# Patient Record
Sex: Male | Born: 1940 | Race: Black or African American | Hispanic: No | Marital: Single | Smoking: Former smoker
Health system: Southern US, Community
[De-identification: ages and names within clinical notes are randomized; demographics above are authoritative.]

## PROBLEM LIST (undated history)

## (undated) DIAGNOSIS — I712 Thoracic aortic aneurysm, without rupture, unspecified: Secondary | ICD-10-CM

## (undated) DIAGNOSIS — N400 Enlarged prostate without lower urinary tract symptoms: Secondary | ICD-10-CM

## (undated) DIAGNOSIS — D72819 Decreased white blood cell count, unspecified: Secondary | ICD-10-CM

## (undated) DIAGNOSIS — J449 Chronic obstructive pulmonary disease, unspecified: Secondary | ICD-10-CM

## (undated) DIAGNOSIS — K219 Gastro-esophageal reflux disease without esophagitis: Secondary | ICD-10-CM

## (undated) DIAGNOSIS — I509 Heart failure, unspecified: Secondary | ICD-10-CM

## (undated) DIAGNOSIS — I10 Essential (primary) hypertension: Secondary | ICD-10-CM

## (undated) DIAGNOSIS — C9000 Multiple myeloma not having achieved remission: Principal | ICD-10-CM

## (undated) DIAGNOSIS — R0602 Shortness of breath: Secondary | ICD-10-CM

## (undated) HISTORY — DX: Decreased white blood cell count, unspecified: D72.819

## (undated) HISTORY — DX: Multiple myeloma not having achieved remission: C90.00

## (undated) HISTORY — DX: Benign prostatic hyperplasia without lower urinary tract symptoms: N40.0

## (undated) HISTORY — DX: Gastro-esophageal reflux disease without esophagitis: K21.9

---

## 1995-04-21 HISTORY — PX: PENILE PROSTHESIS IMPLANT: SHX240

## 1998-02-15 ENCOUNTER — Emergency Department (HOSPITAL_COMMUNITY): Admission: EM | Admit: 1998-02-15 | Discharge: 1998-02-15 | Payer: Self-pay | Admitting: Emergency Medicine

## 1998-02-22 ENCOUNTER — Encounter: Payer: Self-pay | Admitting: Emergency Medicine

## 1998-02-22 ENCOUNTER — Emergency Department (HOSPITAL_COMMUNITY): Admission: EM | Admit: 1998-02-22 | Discharge: 1998-02-22 | Payer: Self-pay | Admitting: Emergency Medicine

## 1998-06-22 ENCOUNTER — Emergency Department (HOSPITAL_COMMUNITY): Admission: EM | Admit: 1998-06-22 | Discharge: 1998-06-22 | Payer: Self-pay | Admitting: *Deleted

## 1999-04-25 ENCOUNTER — Ambulatory Visit (HOSPITAL_COMMUNITY): Admission: RE | Admit: 1999-04-25 | Discharge: 1999-04-25 | Payer: Self-pay | Admitting: Internal Medicine

## 1999-12-05 ENCOUNTER — Encounter: Admission: RE | Admit: 1999-12-05 | Discharge: 1999-12-05 | Payer: Self-pay | Admitting: Otolaryngology

## 1999-12-05 ENCOUNTER — Encounter: Payer: Self-pay | Admitting: Otolaryngology

## 2000-01-18 ENCOUNTER — Encounter: Payer: Self-pay | Admitting: Emergency Medicine

## 2000-01-18 ENCOUNTER — Emergency Department (HOSPITAL_COMMUNITY): Admission: EM | Admit: 2000-01-18 | Discharge: 2000-01-18 | Payer: Self-pay | Admitting: Emergency Medicine

## 2000-01-19 ENCOUNTER — Emergency Department (HOSPITAL_COMMUNITY): Admission: EM | Admit: 2000-01-19 | Discharge: 2000-01-20 | Payer: Self-pay | Admitting: Emergency Medicine

## 2000-01-23 ENCOUNTER — Inpatient Hospital Stay (HOSPITAL_COMMUNITY): Admission: EM | Admit: 2000-01-23 | Discharge: 2000-01-26 | Payer: Self-pay | Admitting: Emergency Medicine

## 2000-01-23 ENCOUNTER — Encounter: Payer: Self-pay | Admitting: Emergency Medicine

## 2000-02-19 ENCOUNTER — Encounter: Payer: Self-pay | Admitting: Emergency Medicine

## 2000-02-19 ENCOUNTER — Emergency Department (HOSPITAL_COMMUNITY): Admission: EM | Admit: 2000-02-19 | Discharge: 2000-02-19 | Payer: Self-pay | Admitting: Emergency Medicine

## 2000-02-21 ENCOUNTER — Inpatient Hospital Stay (HOSPITAL_COMMUNITY): Admission: EM | Admit: 2000-02-21 | Discharge: 2000-02-26 | Payer: Self-pay | Admitting: Emergency Medicine

## 2000-02-21 ENCOUNTER — Encounter: Payer: Self-pay | Admitting: Emergency Medicine

## 2000-08-23 ENCOUNTER — Observation Stay (HOSPITAL_COMMUNITY): Admission: EM | Admit: 2000-08-23 | Discharge: 2000-08-24 | Payer: Self-pay | Admitting: Emergency Medicine

## 2000-08-23 ENCOUNTER — Encounter: Payer: Self-pay | Admitting: Emergency Medicine

## 2000-08-24 ENCOUNTER — Encounter: Payer: Self-pay | Admitting: Interventional Cardiology

## 2001-08-06 ENCOUNTER — Inpatient Hospital Stay (HOSPITAL_COMMUNITY): Admission: EM | Admit: 2001-08-06 | Discharge: 2001-08-08 | Payer: Self-pay | Admitting: Emergency Medicine

## 2001-08-06 ENCOUNTER — Encounter: Payer: Self-pay | Admitting: Emergency Medicine

## 2002-01-27 ENCOUNTER — Emergency Department (HOSPITAL_COMMUNITY): Admission: EM | Admit: 2002-01-27 | Discharge: 2002-01-27 | Payer: Self-pay | Admitting: Emergency Medicine

## 2002-01-27 ENCOUNTER — Encounter: Payer: Self-pay | Admitting: Emergency Medicine

## 2002-01-31 ENCOUNTER — Encounter: Payer: Self-pay | Admitting: Emergency Medicine

## 2002-02-01 ENCOUNTER — Inpatient Hospital Stay (HOSPITAL_COMMUNITY): Admission: EM | Admit: 2002-02-01 | Discharge: 2002-02-05 | Payer: Self-pay | Admitting: Emergency Medicine

## 2002-02-02 ENCOUNTER — Encounter: Payer: Self-pay | Admitting: Internal Medicine

## 2002-07-24 ENCOUNTER — Encounter: Payer: Self-pay | Admitting: Surgery

## 2002-07-24 ENCOUNTER — Encounter: Admission: RE | Admit: 2002-07-24 | Discharge: 2002-07-24 | Payer: Self-pay | Admitting: Surgery

## 2002-07-25 ENCOUNTER — Ambulatory Visit (HOSPITAL_BASED_OUTPATIENT_CLINIC_OR_DEPARTMENT_OTHER): Admission: RE | Admit: 2002-07-25 | Discharge: 2002-07-25 | Payer: Self-pay | Admitting: Surgery

## 2003-04-09 ENCOUNTER — Ambulatory Visit (HOSPITAL_COMMUNITY): Admission: RE | Admit: 2003-04-09 | Discharge: 2003-04-09 | Payer: Self-pay | Admitting: Internal Medicine

## 2004-01-24 ENCOUNTER — Ambulatory Visit: Payer: Self-pay | Admitting: *Deleted

## 2004-02-08 ENCOUNTER — Ambulatory Visit: Payer: Self-pay | Admitting: Internal Medicine

## 2004-04-24 ENCOUNTER — Emergency Department (HOSPITAL_COMMUNITY): Admission: EM | Admit: 2004-04-24 | Discharge: 2004-04-24 | Payer: Self-pay | Admitting: Emergency Medicine

## 2004-04-28 ENCOUNTER — Ambulatory Visit (HOSPITAL_COMMUNITY): Admission: RE | Admit: 2004-04-28 | Discharge: 2004-04-28 | Payer: Self-pay | Admitting: Urology

## 2004-08-11 ENCOUNTER — Ambulatory Visit: Payer: Self-pay | Admitting: Internal Medicine

## 2004-09-26 ENCOUNTER — Emergency Department (HOSPITAL_COMMUNITY): Admission: EM | Admit: 2004-09-26 | Discharge: 2004-09-26 | Payer: Self-pay | Admitting: Emergency Medicine

## 2004-10-08 ENCOUNTER — Ambulatory Visit: Payer: Self-pay | Admitting: Internal Medicine

## 2004-10-14 ENCOUNTER — Emergency Department (HOSPITAL_COMMUNITY): Admission: EM | Admit: 2004-10-14 | Discharge: 2004-10-14 | Payer: Self-pay | Admitting: Emergency Medicine

## 2004-12-08 ENCOUNTER — Ambulatory Visit (HOSPITAL_COMMUNITY): Admission: RE | Admit: 2004-12-08 | Discharge: 2004-12-08 | Payer: Self-pay | Admitting: Urology

## 2005-02-12 ENCOUNTER — Emergency Department (HOSPITAL_COMMUNITY): Admission: EM | Admit: 2005-02-12 | Discharge: 2005-02-12 | Payer: Self-pay | Admitting: Emergency Medicine

## 2005-07-21 ENCOUNTER — Ambulatory Visit: Payer: Self-pay | Admitting: Internal Medicine

## 2005-08-11 ENCOUNTER — Ambulatory Visit: Payer: Self-pay | Admitting: Internal Medicine

## 2005-09-29 ENCOUNTER — Ambulatory Visit: Payer: Self-pay | Admitting: Internal Medicine

## 2005-10-01 ENCOUNTER — Emergency Department (HOSPITAL_COMMUNITY): Admission: EM | Admit: 2005-10-01 | Discharge: 2005-10-01 | Payer: Self-pay | Admitting: Emergency Medicine

## 2005-10-17 ENCOUNTER — Emergency Department (HOSPITAL_COMMUNITY): Admission: EM | Admit: 2005-10-17 | Discharge: 2005-10-17 | Payer: Self-pay | Admitting: *Deleted

## 2005-11-04 ENCOUNTER — Emergency Department (HOSPITAL_COMMUNITY): Admission: EM | Admit: 2005-11-04 | Discharge: 2005-11-04 | Payer: Self-pay | Admitting: Emergency Medicine

## 2006-03-05 ENCOUNTER — Ambulatory Visit: Payer: Self-pay | Admitting: Internal Medicine

## 2006-03-27 ENCOUNTER — Emergency Department (HOSPITAL_COMMUNITY): Admission: EM | Admit: 2006-03-27 | Discharge: 2006-03-28 | Payer: Self-pay | Admitting: Emergency Medicine

## 2006-04-05 ENCOUNTER — Emergency Department (HOSPITAL_COMMUNITY): Admission: EM | Admit: 2006-04-05 | Discharge: 2006-04-05 | Payer: Self-pay | Admitting: Family Medicine

## 2006-04-16 ENCOUNTER — Ambulatory Visit: Payer: Self-pay | Admitting: Internal Medicine

## 2006-06-04 ENCOUNTER — Ambulatory Visit: Payer: Self-pay | Admitting: Internal Medicine

## 2006-08-20 ENCOUNTER — Ambulatory Visit: Payer: Self-pay | Admitting: Internal Medicine

## 2007-04-05 ENCOUNTER — Encounter: Payer: Self-pay | Admitting: Internal Medicine

## 2007-04-05 ENCOUNTER — Ambulatory Visit: Payer: Self-pay | Admitting: Internal Medicine

## 2007-04-05 LAB — CONVERTED CEMR LAB
ALT: 11 units/L (ref 0–53)
AST: 20 units/L (ref 0–37)
Albumin: 4.8 g/dL (ref 3.5–5.2)
Alkaline Phosphatase: 67 units/L (ref 39–117)
BUN: 21 mg/dL (ref 6–23)
Basophils Relative: 0 % (ref 0–1)
Calcium: 10.4 mg/dL (ref 8.4–10.5)
Chloride: 104 meq/L (ref 96–112)
HDL: 53 mg/dL (ref >39)
LDL Cholesterol: 54 mg/dL (ref 0–99)
MCHC: 33.6 g/dL (ref 30.0–36.0)
Monocytes Relative: 13 % — ABNORMAL HIGH (ref 3–12)
Neutro Abs: 1.7 10*3/uL (ref 1.7–7.7)
Neutrophils Relative %: 31 % — ABNORMAL LOW (ref 43–77)
PSA: 3.81 ng/mL (ref 0.10–4.00)
Platelets: 216 10*3/uL (ref 150–400)
Potassium: 4 meq/L (ref 3.5–5.3)
RBC: 4.54 M/uL (ref 4.22–5.81)
Sodium: 142 meq/L (ref 135–145)
Total Protein: 7.7 g/dL (ref 6.0–8.3)
WBC: 5.4 10*3/uL (ref 4.0–10.5)

## 2007-06-23 ENCOUNTER — Ambulatory Visit: Payer: Self-pay | Admitting: Internal Medicine

## 2007-07-04 ENCOUNTER — Ambulatory Visit: Payer: Self-pay | Admitting: Internal Medicine

## 2007-07-28 ENCOUNTER — Ambulatory Visit: Payer: Self-pay | Admitting: Internal Medicine

## 2007-08-10 ENCOUNTER — Ambulatory Visit: Payer: Self-pay | Admitting: Gastroenterology

## 2007-08-10 LAB — CONVERTED CEMR LAB
ALT: 17 units/L (ref 0–53)
Albumin: 4.3 g/dL (ref 3.5–5.2)
Alkaline Phosphatase: 66 units/L (ref 39–117)
BUN: 11 mg/dL (ref 6–23)
Basophils Relative: 0.5 % (ref 0.0–1.0)
Bilirubin, Direct: 0.1 mg/dL (ref 0.0–0.3)
CO2: 34 meq/L — ABNORMAL HIGH (ref 19–32)
Calcium: 9.8 mg/dL (ref 8.4–10.5)
Eosinophils Relative: 2.4 % (ref 0.0–5.0)
GFR calc Af Amer: 96 mL/min
Glucose, Bld: 97 mg/dL (ref 70–99)
HCT: 41.2 % (ref 39.0–52.0)
Hemoglobin: 13.6 g/dL (ref 13.0–17.0)
Lymphocytes Relative: 53.9 % — ABNORMAL HIGH (ref 12.0–46.0)
Monocytes Absolute: 0.6 10*3/uL (ref 0.1–1.0)
Monocytes Relative: 10.1 % (ref 3.0–12.0)
Neutro Abs: 1.9 10*3/uL (ref 1.4–7.7)
RBC: 4.27 M/uL (ref 4.22–5.81)
Sodium: 141 meq/L (ref 135–145)
Total Protein: 7.5 g/dL (ref 6.0–8.3)
WBC: 5.7 10*3/uL (ref 4.5–10.5)

## 2007-08-19 ENCOUNTER — Encounter (INDEPENDENT_AMBULATORY_CARE_PROVIDER_SITE_OTHER): Payer: Self-pay | Admitting: *Deleted

## 2007-08-23 ENCOUNTER — Ambulatory Visit: Payer: Self-pay | Admitting: Gastroenterology

## 2007-09-05 ENCOUNTER — Ambulatory Visit: Payer: Self-pay | Admitting: Internal Medicine

## 2007-09-06 ENCOUNTER — Ambulatory Visit (HOSPITAL_COMMUNITY): Admission: RE | Admit: 2007-09-06 | Discharge: 2007-09-06 | Payer: Self-pay | Admitting: Internal Medicine

## 2008-06-14 ENCOUNTER — Telehealth: Payer: Self-pay | Admitting: Internal Medicine

## 2008-07-11 ENCOUNTER — Ambulatory Visit: Payer: Self-pay | Admitting: Internal Medicine

## 2008-07-11 LAB — CONVERTED CEMR LAB
BUN: 12 mg/dL (ref 6–23)
Calcium: 10.1 mg/dL (ref 8.4–10.5)
Glucose, Bld: 90 mg/dL (ref 70–99)
Potassium: 4.5 meq/L (ref 3.5–5.3)
Sodium: 144 meq/L (ref 135–145)

## 2009-01-01 ENCOUNTER — Ambulatory Visit: Admission: RE | Admit: 2009-01-01 | Discharge: 2009-03-27 | Payer: Self-pay | Admitting: Radiation Oncology

## 2009-03-27 ENCOUNTER — Ambulatory Visit: Admission: RE | Admit: 2009-03-27 | Discharge: 2009-04-19 | Payer: Self-pay | Admitting: Radiation Oncology

## 2009-04-22 ENCOUNTER — Ambulatory Visit: Admission: RE | Admit: 2009-04-22 | Discharge: 2009-05-24 | Payer: Self-pay | Admitting: Radiation Oncology

## 2009-08-20 ENCOUNTER — Ambulatory Visit (HOSPITAL_COMMUNITY): Admission: RE | Admit: 2009-08-20 | Discharge: 2009-08-20 | Payer: Self-pay | Admitting: Internal Medicine

## 2009-08-20 ENCOUNTER — Ambulatory Visit: Payer: Self-pay | Admitting: Internal Medicine

## 2009-08-20 LAB — CONVERTED CEMR LAB
ALT: 21 units/L (ref 0–53)
AST: 41 units/L — ABNORMAL HIGH (ref 0–37)
Albumin: 5 g/dL (ref 3.5–5.2)
CO2: 29 meq/L (ref 19–32)
Calcium: 9.8 mg/dL (ref 8.4–10.5)
Chloride: 90 meq/L — ABNORMAL LOW (ref 96–112)
Lymphocytes Relative: 40 % (ref 12–46)
Lymphs Abs: 1.1 10*3/uL (ref 0.7–4.0)
Monocytes Relative: 14 % — ABNORMAL HIGH (ref 3–12)
Neutrophils Relative %: 44 % (ref 43–77)
Platelets: 255 10*3/uL (ref 150–400)
Potassium: 4.4 meq/L (ref 3.5–5.3)
RBC: 4.23 M/uL (ref 4.22–5.81)
Sodium: 131 meq/L — ABNORMAL LOW (ref 135–145)
Total Protein: 7.7 g/dL (ref 6.0–8.3)
WBC: 2.9 10*3/uL — ABNORMAL LOW (ref 4.0–10.5)

## 2009-09-04 ENCOUNTER — Ambulatory Visit: Payer: Self-pay | Admitting: Internal Medicine

## 2009-09-04 LAB — CONVERTED CEMR LAB
Basophils Relative: 0 % (ref 0–1)
Eosinophils Absolute: 0.1 10*3/uL (ref 0.0–0.7)
Eosinophils Relative: 4 % (ref 0–5)
HCT: 36 % — ABNORMAL LOW (ref 39.0–52.0)
Lymphs Abs: 1 10*3/uL (ref 0.7–4.0)
MCHC: 35 g/dL (ref 30.0–36.0)
MCV: 90.9 fL (ref 78.0–100.0)
Platelets: 231 10*3/uL (ref 150–400)
RDW: 12.3 % (ref 11.5–15.5)
WBC: 3.2 10*3/uL — ABNORMAL LOW (ref 4.0–10.5)

## 2009-09-05 ENCOUNTER — Ambulatory Visit (HOSPITAL_COMMUNITY): Admission: RE | Admit: 2009-09-05 | Discharge: 2009-09-05 | Payer: Self-pay | Admitting: Internal Medicine

## 2009-09-06 ENCOUNTER — Encounter (INDEPENDENT_AMBULATORY_CARE_PROVIDER_SITE_OTHER): Payer: Self-pay | Admitting: Internal Medicine

## 2009-09-20 ENCOUNTER — Ambulatory Visit: Payer: Self-pay | Admitting: Oncology

## 2009-10-01 ENCOUNTER — Encounter (INDEPENDENT_AMBULATORY_CARE_PROVIDER_SITE_OTHER): Payer: Self-pay | Admitting: *Deleted

## 2009-10-01 ENCOUNTER — Ambulatory Visit: Payer: Self-pay | Admitting: Gastroenterology

## 2009-10-01 DIAGNOSIS — R636 Underweight: Secondary | ICD-10-CM | POA: Insufficient documentation

## 2009-10-01 DIAGNOSIS — K3189 Other diseases of stomach and duodenum: Secondary | ICD-10-CM | POA: Insufficient documentation

## 2009-10-01 DIAGNOSIS — R1013 Epigastric pain: Secondary | ICD-10-CM

## 2009-10-09 ENCOUNTER — Ambulatory Visit: Payer: Self-pay | Admitting: Gastroenterology

## 2009-10-14 ENCOUNTER — Encounter: Payer: Self-pay | Admitting: Gastroenterology

## 2009-10-25 ENCOUNTER — Ambulatory Visit: Payer: Self-pay | Admitting: Internal Medicine

## 2009-10-25 LAB — CONVERTED CEMR LAB
Basophils Relative: 0 % (ref 0–1)
Eosinophils Absolute: 0.1 10*3/uL (ref 0.0–0.7)
MCHC: 34.5 g/dL (ref 30.0–36.0)
MCV: 94.2 fL (ref 78.0–100.0)
Monocytes Absolute: 0.6 10*3/uL (ref 0.1–1.0)
Monocytes Relative: 14 % — ABNORMAL HIGH (ref 3–12)
Neutrophils Relative %: 47 % (ref 43–77)
RBC: 3.94 M/uL — ABNORMAL LOW (ref 4.22–5.81)

## 2009-11-29 ENCOUNTER — Ambulatory Visit: Payer: Self-pay | Admitting: Oncology

## 2009-12-02 LAB — CBC WITH DIFFERENTIAL/PLATELET
BASO%: 0.6 % (ref 0.0–2.0)
EOS%: 1.2 % (ref 0.0–7.0)
MCH: 33.6 pg — ABNORMAL HIGH (ref 27.2–33.4)
MCHC: 34.7 g/dL (ref 32.0–36.0)
NEUT%: 44.1 % (ref 39.0–75.0)
RDW: 13.3 % (ref 11.0–14.6)
lymph#: 1.4 10*3/uL (ref 0.9–3.3)

## 2009-12-02 LAB — COMPREHENSIVE METABOLIC PANEL
Albumin: 4.4 g/dL (ref 3.5–5.2)
Alkaline Phosphatase: 55 U/L (ref 39–117)
BUN: 12 mg/dL (ref 6–23)
Glucose, Bld: 104 mg/dL — ABNORMAL HIGH (ref 70–99)
Potassium: 3.7 mEq/L (ref 3.5–5.3)

## 2009-12-02 LAB — LACTATE DEHYDROGENASE: LDH: 130 U/L (ref 94–250)

## 2009-12-02 LAB — MORPHOLOGY: PLT EST: ADEQUATE

## 2009-12-03 LAB — HEPATITIS C ANTIBODY: HCV Ab: NEGATIVE

## 2009-12-03 LAB — HEPATITIS B SURFACE ANTIBODY,QUALITATIVE

## 2009-12-03 LAB — HEPATITIS B SURFACE ANTIGEN: Hepatitis B Surface Ag: NEGATIVE

## 2010-02-12 ENCOUNTER — Encounter: Admission: RE | Admit: 2010-02-12 | Discharge: 2010-02-12 | Payer: Self-pay | Admitting: Neurology

## 2010-03-27 ENCOUNTER — Ambulatory Visit: Payer: Self-pay | Admitting: Oncology

## 2010-03-31 LAB — CBC WITH DIFFERENTIAL/PLATELET
Basophils Absolute: 0 10*3/uL (ref 0.0–0.1)
Eosinophils Absolute: 0.1 10*3/uL (ref 0.0–0.5)
HCT: 36.7 % — ABNORMAL LOW (ref 38.4–49.9)
HGB: 12.8 g/dL — ABNORMAL LOW (ref 13.0–17.1)
LYMPH%: 29.9 % (ref 14.0–49.0)
MCV: 97.1 fL (ref 79.3–98.0)
MONO%: 16.1 % — ABNORMAL HIGH (ref 0.0–14.0)
NEUT#: 1.9 10*3/uL (ref 1.5–6.5)
NEUT%: 51.2 % (ref 39.0–75.0)
Platelets: 223 10*3/uL (ref 140–400)

## 2010-05-06 ENCOUNTER — Encounter (INDEPENDENT_AMBULATORY_CARE_PROVIDER_SITE_OTHER): Payer: Self-pay | Admitting: Family Medicine

## 2010-05-06 LAB — CONVERTED CEMR LAB
Basophils Absolute: 0 10*3/uL (ref 0.0–0.1)
Basophils Relative: 0 % (ref 0–1)
Eosinophils Relative: 3 % (ref 0–5)
HCT: 37 % — ABNORMAL LOW (ref 39.0–52.0)
Hemoglobin: 12.6 g/dL — ABNORMAL LOW (ref 13.0–17.0)
MCHC: 34.1 g/dL (ref 30.0–36.0)
Monocytes Absolute: 0.6 10*3/uL (ref 0.1–1.0)
RDW: 13 % (ref 11.5–15.5)

## 2010-05-15 ENCOUNTER — Other Ambulatory Visit (HOSPITAL_COMMUNITY): Payer: Self-pay | Admitting: Family Medicine

## 2010-05-15 DIAGNOSIS — R911 Solitary pulmonary nodule: Secondary | ICD-10-CM

## 2010-05-20 NOTE — Procedures (Signed)
Summary: Colonoscopy  Patient: [name] Note: All result statuses are Final unless otherwise noted.  Tests: (1) Colonoscopy (COL)   COL Colonoscopy           DONE     [facility]     [address]     [place], [place]  [address]           COLONOSCOPY PROCEDURE REPORT           PATIENT:  Steven Beasley, Steven Beasley  MR#:  [id number]     BIRTHDATE:  [birth year], 69 yrs. old  GENDER:  male     ENDOSCOPIST:  Rachael Fee, MD     REF. BY:  Donia Guiles, M.D.     PROCEDURE DATE:  10/09/2009     PROCEDURE:  Diagnostic Colonoscopy     ASA CLASS:  Class II     INDICATIONS:  Routine Risk Screening, weight loss     MEDICATIONS:   Fentanyl 50 mcg IV, Versed 5 mg IV           DESCRIPTION OF PROCEDURE:   After the risks benefits and     alternatives of the procedure were thoroughly explained, informed     consent was obtained.  Digital rectal exam was performed and     revealed no rectal masses.   The [id number] endoscope was     introduced through the anus and advanced to the cecum, which was     identified by both the appendix and ileocecal valve, without     limitations.  The quality of the prep was good, using MoviPrep.     The instrument was then slowly withdrawn as the colon was fully     examined.     <<PROCEDUREIMAGES>>           FINDINGS:  A normal appearing cecum, ileocecal valve, and     appendiceal orifice were identified. The ascending, hepatic     flexure, transverse, splenic flexure, descending, sigmoid colon,     and rectum appeared unremarkable (see image2, image3, and image4).     Retroflexed views in the rectum revealed no abnormalities.    The     scope was then withdrawn from the patient and the procedure     completed.           COMPLICATIONS:  None           ENDOSCOPIC IMPRESSION:     1) Normal colon     2) No cancers or polyps           RECOMMENDATIONS:     1) Continue current colorectal screening recommendations for     "routine risk" patients  with a repeat colonoscopy in 10 years.           REPEAT EXAM: 10 years           ______________________________     Rachael Fee, MD           n.     eSIGNED:   Rachael Fee at 10/09/2009 03:13 PM           [place], [id number]  Note: An exclamation mark (!) indicates a result that was not dispersed into the flowsheet. Document Creation Date: 10/09/2009 3:13 PM _______________________________________________________________________  (1) Order result status: Final Collection or observation date-time: 10/09/2009 15:09 Requested date-time:  Receipt date-time:  Reported date-time:  Referring Physician:   Ordering Physician: Rob Bunting [phone]) Specimen Source:  Source: Launa Grill  Order Number: [id number] Lab site:   Appended Document: Colonoscopy     Procedures Next Due Date:    Colonoscopy: 10/2019

## 2010-05-20 NOTE — Letter (Signed)
Summary: [facility] Instructions  [facility]  [address] [place], [place] [address]   Phone: [phone]  Fax: [phone]       Steven Beasley    09/09/1940    MRN: [id number]        Procedure Day /Date:10/09/09     Arrival Time:230 PM     Procedure Time:330 PM     Location of Procedure:                    10/09/09  [facility] (4th Floor)   PREPARATION FOR COLONOSCOPY WITH MOVIPREP   Starting 5 days prior to your procedure today do not eat nuts, seeds, popcorn, corn, beans, peas,  salads, or any raw vegetables.  Do not take any fiber supplements (e.g. Metamucil, Citrucel, and Benefiber).  THE DAY BEFORE YOUR PROCEDURE         DATE: 10/08/09   DAY:TUE  1.  Drink clear liquids the entire day-NO SOLID FOOD  2.  Do not drink anything colored red or purple.  Avoid juices with pulp.  No orange juice.  3.  Drink at least 64 oz. (8 glasses) of fluid/clear liquids during the day to prevent dehydration and help the prep work efficiently.  CLEAR LIQUIDS INCLUDE: Water Jello Ice Popsicles Tea (sugar ok, no milk/cream) Powdered fruit flavored drinks Coffee (sugar ok, no milk/cream) Gatorade Juice: apple, white grape, white cranberry  Lemonade Clear bullion, consomm, broth Carbonated beverages (any kind) Strained chicken noodle soup Hard Candy                             4.  In the morning, mix first dose of MoviPrep solution:    Empty 1 Pouch A and 1 Pouch B into the disposable container    Add lukewarm drinking water to the top line of the container. Mix to dissolve    Refrigerate (mixed solution should be used within 24 hrs)  5.  Begin drinking the prep at 5:00 p.m. The MoviPrep container is divided by 4 marks.   Every 15 minutes drink the solution down to the next mark (approximately 8 oz) until the full liter is complete.   6.  Follow completed prep with 16 oz of clear liquid of your choice (Nothing red or purple).  Continue to drink  clear liquids until bedtime.  7.  Before going to bed, mix second dose of MoviPrep solution:    Empty 1 Pouch A and 1 Pouch B into the disposable container    Add lukewarm drinking water to the top line of the container. Mix to dissolve    Refrigerate  THE DAY OF YOUR PROCEDURE      DATE:10/09/09  DAY: WED  Beginning at 1030 a.m. (5 hours before procedure):         1. Every 15 minutes, drink the solution down to the next mark (approx 8 oz) until the full liter is complete.  2. Follow completed prep with 16 oz. of clear liquid of your choice.    3. You may drink clear liquids until 130 pm (2 HOURS BEFORE PROCEDURE).   MEDICATION INSTRUCTIONS  Unless otherwise instructed, you should take regular prescription medications with a small sip of water   as early as possible the morning of your procedure.           OTHER INSTRUCTIONS  You will need a responsible adult at least 69 years of  age to accompany you and drive you home.   This person must remain in the waiting room during your procedure.  Wear loose fitting clothing that is easily removed.  Leave jewelry and other valuables at home.  However, you may wish to bring a book to read or  an iPod/MP3 player to listen to music as you wait for your procedure to start.  Remove all body piercing jewelry and leave at home.  Total time from sign-in until discharge is approximately 2-3 hours.  You should go home directly after your procedure and rest.  You can resume normal activities the  day after your procedure.  The day of your procedure you should not:   Drive   Make legal decisions   Operate machinery   Drink alcohol   Return to work  You will receive specific instructions about eating, activities and medications before you leave.    The above instructions have been reviewed and explained to me by   _______________________    I fully understand and can verbalize these instructions  _____________________________ Date _________

## 2010-05-20 NOTE — Procedures (Signed)
Summary: Upper Endoscopy  Patient: Steven Beasley Note: All result statuses are Final unless otherwise noted.  Tests: (1) Upper Endoscopy (EGD)   EGD Upper Endoscopy       DONE     [facility]     [address]     [place], [place]  [address]           ENDOSCOPY PROCEDURE REPORT     PATIENT:  Steven Beasley, Steven Beasley  MR#:  [id number]     BIRTHDATE:  1941-01-29, 69 yrs. old  GENDER:  male     ENDOSCOPIST:  Rachael Fee, MD     PROCEDURE DATE:  10/09/2009     referred by:  Donia Guiles, MD     PROCEDURE:  EGD with biopsy     ASA CLASS:  Class II     INDICATIONS:  dyspepsia, weight loss (6 pounds in 2 years)     MEDICATIONS:  There was residual sedation effect present from     prior procedure., Versed 1 mg IV     TOPICAL ANESTHETIC:  Exactacain Spray     DESCRIPTION OF PROCEDURE:   After the risks benefits and     alternatives of the procedure were thoroughly explained, informed     consent was obtained.  The LB GIF-H180 [id number] endoscope was     introduced through the mouth and advanced to the second portion of     the duodenum, without limitations.  The instrument was slowly     withdrawn as the mucosa was fully examined.     <<PROCEDUREIMAGES>>           There was mild, non-specific pan-gastritis. This was biopsied to     check for H. pylori (see image1 and image4).  Otherwise the     examination was normal (see image3 and image2).    Retroflexed     views revealed no abnormalities.    The scope was then withdrawn     from the patient and the procedure completed.           COMPLICATIONS:  None           ENDOSCOPIC IMPRESSION:     1) Mild, non-specific gastritis. Biopsies taken to check for H.     pylori     2) Otherwise normal examination           RECOMMENDATIONS:     If biopsies show H. pylori, then he will be started on     appropriate antibiotics.           ______________________________     Rachael Fee, MD           n.     eSIGNED:   Rachael Fee at 10/09/2009 03:25 PM           [place], [id number]  Note: An exclamation mark (!) indicates a result that was not dispersed into the flowsheet. Document Creation Date: 10/09/2009 3:25 PM _______________________________________________________________________  (1) Order result status: Final Collection or observation date-time: 10/09/2009 15:20 Requested date-time:  Receipt date-time:  Reported date-time:  Referring Physician:   Ordering Physician: Rob Bunting [phone]) Specimen Source:  Source: Launa Grill Order Number: [id number] Lab site:

## 2010-05-20 NOTE — Letter (Signed)
Summary: Results Letter  [facility]  [address] [place], [place] [address]   Phone: [phone]  Fax: [phone]        October 14, 2009 MRN: [id number]    [facility] [address] [place], [place]  [address]    Dear Mr. Moors,   The biopsies taken during your recent EGD showed no sign of infection or cancer.  You should continue to follow the recommnedations that we discussed at the time of your procedure.  Please feel free to call if you have any further questions or concerns.       Sincerely,  Rachael Fee MD  This letter has been electronically signed by your physician.  Appended Document: Results Letter letter mailed

## 2010-05-20 NOTE — Assessment & Plan Note (Signed)
Review of gastrointestinal problems: 1. Candida esophagitis, 2009 by EGD, Treated empirically.    History of Present Illness Visit Type: Initial Consult Primary GI MD: Rob Bunting MD Primary Provider: Donia Guiles MD Requesting Provider: Donia Guiles MD Chief Complaint: weight loss History of Present Illness:      very pleasant 70 year old man whom I last saw about 2 years ago at that time I diagnosed him with Candida esophagitis. He returns now with reports of weight loss. His appetite goes and comes.   Has been "pretty good lately."  No dysphagia.  does not get pyrosis. weight is down 6 punds from 2 years ago here in GI.  he thinks she is gaining weight back. A report from health survey shows that he thought he had lost 12-13 pounds back in May. He has difficulty with early satiety intermittently.  no overt GI bleeding.  No black colored stools.  he has never had a colonoscopy that he is aware of.  I reviewed recent CBC, complete metabolic profile from early may 2011. Of interest was a pathology smear review recommending a hematology consultation. He does not believe that he has been set up for that visit yet.             Current Medications (verified): 1)  Nexium 40 Mg  Cpdr (Esomeprazole Magnesium) .Marland Kitchen.. 1 Capsule Each Day 30 Minutes Before Meal 2)  Omeprazole 20 Mg Cpdr (Omeprazole) .... One Daily 3)  Gabapentin 300 Mg Caps (Gabapentin) .Marland Kitchen.. 1 By Mouth Three Times A Day 4)  Detrol La 4 Mg Xr24h-Cap (Tolterodine Tartrate) .Marland Kitchen.. 1 By Mouth Once Daily 5)  Elmiron 100 Mg Caps (Pentosan Polysulfate Sodium) .Marland Kitchen.. 1 By Mouth Three Times A Day 6)  Tamsulosin Hcl 0.4 Mg Caps (Tamsulosin Hcl) .Marland Kitchen.. 1 By Mouth Once Daily 7)  Venlafaxine Hcl 37.5 Mg Tabs (Venlafaxine Hcl) .Marland Kitchen.. 1 By Mouth Two Times A Day 8)  Lisinopril 20 Mg Tabs (Lisinopril) .Marland Kitchen.. 1 By Mouth Once Daily 9)  Amlodipine Besylate 10 Mg Tabs (Amlodipine Besylate) .Marland Kitchen.. 1 By Mouth Once Daily  Allergies (verified): No Known  Drug Allergies  Vital Signs:  Patient profile:   70 year old male Height:      74 inches Weight:      162.13 pounds BMI:     20.89 Pulse rate:   70 / minute Pulse rhythm:   regular BP sitting:   110 / 60  (left arm)  Vitals Entered By: Chales Abrahams CMA Duncan Dull) (October 01, 2009 2:00 PM)  Physical Exam  Additional Exam:  Constitutional: generally well appearing Psychiatric: alert and oriented times 3 Abdomen: soft, non-tender, non-distended, normal bowel sounds    Impression & Recommendations:  Problem # 1:  Weight loss, early satiety, routine risk for colon cancer I will set him up for colonoscopy and upper endoscopy at his soonest convenience.  Problem # 2:  abnormal blood smear, pathology report she has not been set up for hematology referral. I will forward this pathology note to his primary care physician and will allow referral to be made from there if it is deemed appropriate.  Patient Instructions: 1)  You will be scheduled to have a colonoscopy. 2)  You will be scheduled to have an upper endoscopy. 3)  We will forward the pathology smear result from recent blood tests drawn by Dr. Reche Dixon, it was recommended that you have a hematology consultation.  Will leave this to Dr. Reche Dixon to arrange. 4)  The medication list was reviewed and  reconciled.  All changed / newly prescribed medications were explained.  A complete medication list was provided to the patient / caregiver.  Appended Document: Orders Update/movi    Clinical Lists Changes  Problems: Added new problem of UNDERWEIGHT (ICD-783.22) Added new problem of DYSPEPSIA&OTHER SPEC DISORDERS FUNCTION STOMACH (ICD-536.8) Medications: Added new medication of MOVIPREP 100 GM  SOLR (PEG-KCL-NACL-NASULF-NA ASC-C) As per prep instructions. - Signed Rx of MOVIPREP 100 GM  SOLR (PEG-KCL-NACL-NASULF-NA ASC-C) As per prep instructions.;  #1 x 0;  Signed;  Entered by: Chales Abrahams CMA (AAMA);  Authorized by: Rachael Fee MD;   Method used: Electronically to [facility] [id number], [place], [place], [place]  [address], Ph: [phone], Fax: [phone] Orders: Added new Test order of Colon/Endo (Colon/Endo) - Signed    Prescriptions: MOVIPREP 100 GM  SOLR (PEG-KCL-NACL-NASULF-NA ASC-C) As per prep instructions.  #1 x 0   Entered by:   Chales Abrahams CMA (AAMA)   Authorized by:   Rachael Fee MD   Signed by:   Chales Abrahams CMA (AAMA) on 10/01/2009   Method used:   Electronically to        [facility] [id number] (retail)       [address] [place], [place]  [address]       Ph: [phone]       Fax: [phone]   RxID:   [id number]    Appended Document:  rx for moviprep cx pt could not afford so sample was given

## 2010-05-28 ENCOUNTER — Encounter (HOSPITAL_COMMUNITY): Payer: Self-pay

## 2010-05-28 ENCOUNTER — Ambulatory Visit (HOSPITAL_COMMUNITY)
Admission: RE | Admit: 2010-05-28 | Discharge: 2010-05-28 | Disposition: A | Discharge status: Home / Self Care | Payer: Medicare Other | Source: Ambulatory Visit | Attending: Family Medicine | Admitting: Family Medicine

## 2010-05-28 DIAGNOSIS — K2289 Other specified disease of esophagus: Secondary | ICD-10-CM | POA: Insufficient documentation

## 2010-05-28 DIAGNOSIS — K228 Other specified diseases of esophagus: Secondary | ICD-10-CM | POA: Insufficient documentation

## 2010-05-28 DIAGNOSIS — R911 Solitary pulmonary nodule: Secondary | ICD-10-CM

## 2010-05-28 DIAGNOSIS — J984 Other disorders of lung: Secondary | ICD-10-CM | POA: Insufficient documentation

## 2010-05-28 HISTORY — DX: Essential (primary) hypertension: I10

## 2010-05-28 MED ORDER — IOHEXOL 300 MG/ML  SOLN: 80.0000 mL | Freq: Once | INTRAMUSCULAR | Status: AC | PRN

## 2010-07-28 ENCOUNTER — Encounter (HOSPITAL_BASED_OUTPATIENT_CLINIC_OR_DEPARTMENT_OTHER): Payer: Medicare Other | Admitting: Oncology

## 2010-07-28 ENCOUNTER — Other Ambulatory Visit: Payer: Self-pay | Admitting: Oncology

## 2010-07-28 DIAGNOSIS — G589 Mononeuropathy, unspecified: Secondary | ICD-10-CM

## 2010-07-28 DIAGNOSIS — I10 Essential (primary) hypertension: Secondary | ICD-10-CM

## 2010-07-28 DIAGNOSIS — Z8546 Personal history of malignant neoplasm of prostate: Secondary | ICD-10-CM

## 2010-07-28 DIAGNOSIS — C61 Malignant neoplasm of prostate: Secondary | ICD-10-CM

## 2010-07-28 DIAGNOSIS — N400 Enlarged prostate without lower urinary tract symptoms: Secondary | ICD-10-CM

## 2010-07-28 DIAGNOSIS — D72819 Decreased white blood cell count, unspecified: Secondary | ICD-10-CM

## 2010-07-28 DIAGNOSIS — K219 Gastro-esophageal reflux disease without esophagitis: Secondary | ICD-10-CM

## 2010-07-28 LAB — CBC WITH DIFFERENTIAL/PLATELET
Eosinophils Absolute: 0.1 10*3/uL (ref 0.0–0.5)
HCT: 35.9 % — ABNORMAL LOW (ref 38.4–49.9)
LYMPH%: 34.4 % (ref 14.0–49.0)
MONO#: 0.5 10*3/uL (ref 0.1–0.9)
NEUT#: 1.4 10*3/uL — ABNORMAL LOW (ref 1.5–6.5)
NEUT%: 45.4 % (ref 39.0–75.0)
Platelets: 190 10*3/uL (ref 140–400)
WBC: 3 10*3/uL — ABNORMAL LOW (ref 4.0–10.3)

## 2010-07-28 LAB — COMPREHENSIVE METABOLIC PANEL
CO2: 32 mEq/L (ref 19–32)
Creatinine, Ser: 1.03 mg/dL (ref 0.40–1.50)
Glucose, Bld: 77 mg/dL (ref 70–99)
Total Bilirubin: 0.5 mg/dL (ref 0.3–1.2)
Total Protein: 7 g/dL (ref 6.0–8.3)

## 2010-09-02 NOTE — Assessment & Plan Note (Signed)
[facility]                         GASTROENTEROLOGY OFFICE NOTE   Steven Beasley, Steven Beasley                    MRN:          [id number]  DATE:08/10/2007                            DOB:          [birth year]    REFERRING PHYSICIAN:  Sigmund I. Patsi Sears, M.D.   This is a referral by Dr. Patsi Sears for evaluation of abdominal  discomfort, epigastric pain.   HISTORY OF PRESENT ILLNESS:  Steven Beasley is a very pleasant 70 year old  man who has had approximately one to two years of :funny feeling in his  epigastrium.  Eating does not seem to make any difference.  Moving his  bowels sometimes helps this discomfort, although he is not chronically  constipated.  He has one soft-formed brown bowel movement a day.  He has  no overt GI bleeding.  The pain is a funny feeling.  Sometimes, he is  describing it as a burning sensation, and he is pointing to his  periumbilical to epigastrium as the site.  This discomfort does not  limit him in any way.  He has no dysphagia, no odynophagia and takes no  NSAIDs regularly.   REVIEW OF SYSTEMS:  Notable for a stable weight and is otherwise  essentially normal and is available on his intake sheet.   PAST MEDICAL HISTORY:  1. Hypertension.  2. BPH.   CURRENT MEDICINES:  1. Flomax.  2. Famotidine 20 mg once daily.  3. Fexofenadine.  4. Omeprazole 20 mg once daily.  5. Amlodipine.  6. Detrol.  7. Nexium 40 mg once daily.  8. Elmiron t.i.d.  9. Singulair.  10.Lisinopril.   ALLERGIES:  NO KNOWN DRUG ALLERGIES.   SOCIAL HISTORY:  He is single.  Three children.  Lives by himself.  Nonsmoker.  Drinks about a six-pack on a weekend, but that is all.   FAMILY HISTORY:  No colon cancer or colon polyps in the family.   PHYSICAL EXAMINATION:  6 foot, 1 inch, 168 pounds.  Blood pressure 132/88, pulse of 76.  CONSTITUTIONAL:  Generally well-appearing.  NEUROLOGIC:  Alert and oriented x3.  Eyes:  Extraocular movements  intact.  Mouth:  Oropharynx moist, no  lesions.  NECK:  Supple, no lymphadenopathy.  CARDIOVASCULAR/HEART:  Regular rate and rhythm.  LUNGS:  Clear to auscultation bilaterally.  ABDOMEN:  Soft, nontender, nondistended, normal bowel sounds.  EXTREMITIES:  No lower extremity edema.  SKIN:  No rashes or lesions on the visible extremities.   ASSESSMENT:  71 year old man with chronic epigastric/periumbilical pain.   I am most struck by the fact that he is taking two PPIs and one H2  blocker.  He certainly may have GERD, and this can cause a whole variety  of abdominal symptoms.  I having him stop the omeprazole and stop the  famotidine and take only the Nexium.  I have instructed him to take it  20 to 30 minutes prior to his breakfast meals.  That is the way the  pills are designed to work most effectively.  I will also arrange her to  have an EGD performed at his earliest convenience.  We are also  giving  him a GERD handout.  If EGD is not helpful to explain his discomfort,  then I will probably arrange for him to have a CT scan.  He is also  going to get a CBC and a complete metabolic profile today.     Rachael Fee, MD  Electronically Signed    DPJ/MedQ  DD: 08/10/2007  DT: 08/10/2007  Job #: [id number]   cc:   Lynelle Smoke I. Patsi Sears, M.D.

## 2010-09-05 NOTE — H&P (Signed)
[place]  Patient:    Steven Beasley, Steven Beasley Visit Number: [id number] MRN: [id number]          Service Type: MED Location: [phone] Attending Physician:  Lillia Mountain Dictated by:   Hinda Glatter, M.D. Admit Date:  08/06/2001                           History and Physical  CHIEF COMPLAINT: Light headedness.  HISTORY OF PRESENT ILLNESS: This is a 71 year old black male, with a history of hypertension and COPD, who presented to the emergency department at [facility]. [employer] with complaint of light headedness.  The patient states that approximately ten days ago he had several episodes of nausea and vomiting that occurred every several days.  There was no evidence of blood in his emesis, and that has since stopped over the last several days.  On August 02, 2001 he began to feel weak and light headed, particularly when he stood up.  It again occurred on August 03, 2001 and today he had several episodes of weakness and light headedness after standing up.  At no point in time did he have a syncopal episode.  He also denies any preceding heart palpitations, fast heart rate, chest pain, or shortness of breath, or any seizure-like activity.  In the emergency department at [facility] the patient was found to be significantly orthostatic on orthostatic blood pressure measurements, which improved after receiving several liters of normal saline.  The patient was also noted to have an elevated AST and ALT on his liver enzymes.  The patient does state he drinks alcohol on the weekends but denies excessive use.  Also denies any new medications, acetaminophen use, or recent illness other than what has been noted above.  PAST MEDICAL HISTORY:  1. COPD.  2. Asthma.  3. Allergic rhinitis.  4. Hypertension.  5. GERD.  6. Inguinal hernia.  7. Tobacco abuse.  MEDICATIONS:  1. Hydrochlorothiazide q.d., unknown dose.  2. Aspirin q.d.  3. Protonix 40 mg q.d.  4. Zantac q.d.  5. Norvasc q.d., unknown dose.  6. Albuterol MDI as needed.  ALLERGIES: No known drug allergies.  PAST SURGICAL HISTORY: Pertinent for penile prosthesis in October 1997.  FAMILY HISTORY: Father deceased secondary to unknown poisoning at age 22. Mother deceased at age 44 secondary to stroke.  Sister has asthma.  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  He smoked a pack per day for about 30 years but quit a year ago. He does drink alcohol on the weekends.  REVIEW OF SYSTEMS: Pertinent for occasional chills, increasing urinary frequency, and chronic cough for greater than one year that is dry in nature.  PHYSICAL EXAMINATION:  VITAL SIGNS: Blood pressure 91/57, which decreased to 67/40 upon standing. Current blood pressure 112/72.  Pulse 95.  Respirations 14-20.  GENERAL APPEARANCE: Well-developed, well-nourished male who is in no acute distress.  Alert and oriented, pleasant in demeanor.  HEENT: PERRL.  EOMI.  Sclerae nonicteric.  Nares clear.  Oral cavity, oropharynx pertinent for dry mucous membranes, no other masses or lesions noted.  NECK: Supple.  No adenopathy.  No bruits.  No increased jugular venous distention.  No adenopathy.  CARDIOVASCULAR: Regular rate and rhythm.  S1 and S2 without murmurs, rubs, or gallops.  LUNGS: Clear to auscultation bilaterally.  ABDOMEN: Normoactive bowel sounds.  Soft, nontender, nondistended.  No hepatosplenomegaly.  No palpable masses.  No bruits.  RECTAL: Deferred.  EXTREMITIES: No clubbing, cyanosis, or edema.  Peripheral pulses 2+.  NEUROLOGIC: Grossly intact.  LABORATORY DATA: Sodium 128, potassium 3.6, chloride 89, bicarbonate 30, BUN 19, creatinine 1.1, glucose 99.  WBC 3.2, hemoglobin 12.4, platelets 108,000; MCV 91.4; 58% segs, 31% lymphocytes, 10% monocytes.  Calcium 8.4, bilirubin 0.7, AST 336, ALT 261, alkaline  phosphatase 72, albumin 3.5, protein 6.5. Urinalysis showed pH 1.011, negative ketones, negative nitrites, negative esterase, negative bilirubin.  Lipase 25, amylase 182.  Chest x-ray showed no acute cardiopulmonary disease but COPD changes were noted.  EKG showed normal sinus rhythm with nonspecific ST-T wave changes.  IMPRESSION: This is a 70 year old male, with a history of hypertension and chronic obstructive pulmonary disease, admitted with weakness and light headedness, recent nausea and vomiting, as well as positive orthostatic blood pressure measurements.  He also has elevated transaminase of unknown etiology, although alcohol use may be the cause with his AST being greater than his ALT. Cannot exclude hepatitis at present time or drug use such as acetaminophen use, although the patient denies this at the present time.  The cause of the patients light headedness is likely set intravascular depletion with his recent nausea and vomiting as well as being on a diuretic for his blood pressure.  There certainly is no history of syncope or prodromal symptoms prior to his presyncopal event.  PLAN:  1. The patient will be admitted to a monitored bed.  2. Orthostatic hypotension.  The patient will receive IV fluids with normal     saline hydration and be reevaluated in the morning.  He will have his     hydrochlorothiazide discontinued for his blood pressure and should he need     additional hypertension medications an alternative agent will be     considered.  No further work-up at present time is planned.  3. Fluids, electrolytes, and nutrition.  The patient has mild hyponatremia     which appears to be secondary to hydration, and he will receive IV fluid     hydration with repeat basic metabolic panel in the morning.  4. Leukopenia.  The etiology of this is unknown at the present time; however,     it may be secondary to alcohol use with bone marrow suppression since he     also has  some mild thrombocytopenia noted as well.  Other possible causes      could be a viral infection, possible medication side effect.  We will     recheck a CBC with Differential in the morning.  5. GI.  Based on the patients elevated AST and ALT we will go ahead and     check acute and chronic hepatitis laboratories as well as acetaminophen     and alcohol level for further evaluation.  We will recheck liver function     tests in the morning.  The patient will also likely need a PT and INR     checked for further evaluation.  6. Further management pending results of prior mentioned tests. Dictated by:   Hinda Glatter, M.D. Attending Physician:  Lillia Mountain DD:  08/07/01 TD:  08/08/01 Job: [id number]

## 2010-09-05 NOTE — Discharge Summary (Signed)
[place]. [facility]  Patient:    Steven Beasley, Steven Beasley Visit Number: [id number] MRN: [id number]          Service Type: MED Location: [phone] Attending Physician:  Lillia Mountain Dictated by:   Thora Lance, M.D. Admit Date:  08/06/2001 Disc. Date: 08/08/01                             Discharge Summary  REASON FOR ADMISSION:  This is a 70 year old black male with a history of hypertension and chronic obstructive pulmonary disease and asthma, who presented with light-headedness.  The patient stated approximately 10 days prior to admission he had several episodes of nausea and vomiting that occurred over several days.  On August 02, 2001, he began to feel weak and light-headed, particularly when standing up.  It occurred again on August 03, 2001, and then on the day of admission he had several episodes of weakness and light-headedness when standing up.  At no point did he have any syncopal episodes.  He denied any chest pain, shortness of breath, palpitations, or seizure-like activity.  In the emergency room at [facility] he was found to be significantly orthostatic and was admitted.  PHYSICAL EXAMINATION:  VITAL SIGNS:  Blood pressure 91/57, which decreased to 67/40 when standing.  Heart rate 95, respirations 14 to 20.  The rest of his examination was unremarkable.  LABORATORY DATA:  Sodium 128, potassium 3.6, chloride 89, bicarbonate 30, glucose 99, BUN 19, creatinine 1.1, calcium 8.4.  Total protein 6.5, albumin 3.5, SGOT 336, SGPT 261, alkaline phosphatase 72, total bilirubin 0.7, lipase 25.  Blood counts showed a white blood cell count of 3.2, hemoglobin 12.4, platelet count 108.  Urinalysis was negative.  Alcohol level was less than 5. Acetaminophen level 3.2.  GGT was 32.  Chest x-ray showed chronic obstructive pulmonary disease, no active disease. EKG showed normal sinus rhythm, nonspecific ST-T wave changes.  HOSPITAL  COURSE:  #1 - ORTHOSTASIS:  The patient was admitted for orthostatic hypotension and symptoms from that.  The etiology is felt to be likely related to his recent nausea and vomiting in conjunction with diuretic use.  The patient was hypotensive on admission, especially when standing.  He was treated with IV fluids.  His diuretic was held.  Within 48 hours he was feeling much better and was able to ambulate without symptoms.  His orthostatics revealed that his orthostasis had resolved, and his blood pressure was 116 while standing.  He was discharged to home on a lower dose of diuretic.  #2 - ELEVATED LIVER FUNCTION TESTS:  His liver function tests were found to be moderately elevated, although his GGT was normal.  The etiology of this was unclear.  Hepatitis serologies were drawn, as was CMV, IgM, IgG, and a mono spot which were pending at discharge.  It is possible that hypotension may have been responsible for his mild bump in liver function tests.  It is also possible that the elevation may have been muscle in origin, as the GGT was normal and a CPK was elevated over 400.  At discharge, his liver function tests (transaminases) were improving, and this will be followed up as an outpatient.  #3 - PANCYTOPENIA:  The patient had mild pancytopenia with white blood cell count of about 3000, his platelet count was in the low 100s, and a hemoglobin of about 12.  The etiology of this was  also not clear.  It may be that this was viral related to a recent viral infection.  HIV was pending at discharge. This will be followed up as an outpatient.  Keep in mind that the possibility that one of his medications could be causing this.  DISCHARGE DIAGNOSES: 1. Dehydration. 2. Orthostatic hypotension. 3. Elevated transaminases. 4. Pancytopenia.  PROCEDURES:  None.  DISCHARGE MEDICATIONS: 1. Singular 10 mg q.h.s. 2. Advair one inhalation b.i.d. 3. Nasal steroid inhaler. 4. Lisinopril plus  hydrochlorothiazide 20/12.5 mg q.d. (reduced in dose). 5. Norvasc 5 mg q.d. 6. Protonix 40 mg q.d. 7. Albuterol p.r.n.  DIET:  No restrictions.  ACTIVITY:  As tolerated.  No work note given for April 7 through August 08, 2001, return to work on August 09, 2001.  FOLLOWUP:  One week with Dr. Valentina Lucks.  At that visit we will need to review his medications, recheck his CBC, liver function tests, and CPK.  Follow up on his hepatitis serologies, CMV, and mono spot.Dictated by:   Thora Lance, M.D. Attending Physician:  Lillia Mountain DD:  08/08/01 TD:  08/08/01 Job: [phone] [id number]

## 2010-09-05 NOTE — Op Note (Signed)
NAME:  Steven Beasley, Steven Beasley             ACCOUNT NO.:  [id number]   MEDICAL RECORD NO.:  [id number]          PATIENT TYPE:  AMB   LOCATION:  DAY                          FACILITY:  [facility]   PHYSICIAN:  Sigmund I. Patsi Sears, M.D.DATE OF BIRTH:  08-Dec-1940   DATE OF PROCEDURE:  04/28/2004  DATE OF DISCHARGE:  04/28/2004                                 OPERATIVE REPORT   PREOPERATIVE DIAGNOSIS:  Malfunctioning penial prosthesis.   POSTOPERATIVE DIAGNOSIS:  Malfunctioning penial prosthesis.   PROCEDURES PERFORMED:  1.  Removal of Mentor semi-rigid penial prosthesis.  2.  Insertion of AMS semi-rigid penial prosthesis.   ATTENDING PHYSICIAN:  Sigmund I. Patsi Sears, M.D.   RESIDENT SURGEON:  Thyra Breed, M.D.   ANESTHESIA:  General endotracheal.   ESTIMATED BLOOD LOSS:  Minimal.   DRAINS:  A 16 French Foley catheter to straight drain.   COMPLICATIONS:  None.   INDICATIONS FOR PROCEDURE:  Steven Beasley is a 70 year old African American  male with erectile dysfunction treated with a semi-rigid penial prosthesis  inserted in March of 1998.  Recently he was reevaluated in the office,  stating he had difficult with intercourse.  Radiographic imaging showed  bilateral breakage of the semi-rigid penial prosthesis proximally.  Therefore, we discussed surgical intervention, specifically removal of his  Mental prosthesis and replacement with a new semi-rigid prosthesis, most  likely an AMS.  All risks, benefits and alternatives of the procedure were  described in detail and the patient is willing to proceed.   PROCEDURE IN DETAIL:  Following identification by his arm bracelet, the  patient was brought to the operating room and placed in the supine position.  He received preoperative IV antibiotics and underwent successful induction  of general endotracheal anesthesia.  He then received a 10-minute iodine  scrub.  His perineum and genitalia were then prepped with Betadine and  draped in the usual  sterile fashion.  An 11 blade scalpel was then used to  make an incision in the penoscrotal region through the previous incision.  Bovie electrocautery was used to carry the incision through the Dartos  fascia until the corpora were visible.  A 16 French Foley catheter was  placed and easily palpable in the urethra.  Metzenbaum scissors were then  used to carefully dissect all overlying scar and adherent tissue from the  corpora proximally.  Stay sutures of 2-0 PDS were then used proximally on  each corpora to mark the sites for corporotomies.  We began on the right  corpora, deeply incising the corpora.  Bovie electrocautery was used to  carried the incision down until the prosthetic device was visible.  The  corporotomies were then opened such that removal and replacement of the  prosthetic device could be easily performed.  We then turned our attention  to the left corpora where again a Bovie electrocautery was used to make the  corporotomy.  Both semi-rigid devices were then removed.  Specifically, the  right corpora had a 23 cm device and the left corpora had a 21 cm device.  We then opened the AMS device and found that 25 cm on  either side provided  him distal tip to be just beneath the glans bilaterally.  The device was a  standard 22 cm and 3 cm rear tips were used proximally.  With the new semi-  rigid prosthesis in each corpora it provided an excellent result.  The penis  was straight and adequate for intercourse.  Corporotomies were then closed  with a running 2-0 PDS.  The stay sutures were also closed to strengthen the  corporotomy closures.  The incision was then irrigated.  There was no active  bleeding at this point in the case.  Then 2-0 Vicryl was used to close the  Dartos tissue and 4-0 Vicryl was used in a running fashion to close the  penoscrotal incision.  The incision was washed and dried.  Collodion was  applied to the suture line.  This was followed by sterile  dressing.  The  Foley catheter was connected to straight drainage.  The patient tolerated  the procedure well and there were no complications.  Please note that Dr.  Patsi Sears was present and participated in the entire procedure.  He was the  responsible surgeon.   DISPOSITION:  After awaking from general anesthesia, the patient was  transferred to the postanesthesia care unit in stable condition.  From here  he will be transferred to home after removal of his Foley catheter and a  successful voiding trial.     Steven Beasley   EG/MEDQ  D:  04/30/2004  T:  04/30/2004  Job:  [id number]

## 2010-09-05 NOTE — H&P (Signed)
NAME:  Steven Beasley, Steven Beasley                       ACCOUNT NO.:  [id number]   MEDICAL RECORD NO.:  [id number]                   PATIENT TYPE:  INP   LOCATION:  5511                                 FACILITY:  [facility]   PHYSICIAN:  Armstead Peaks, M.D.                   DATE OF BIRTH:  Mar 27, 1941   DATE OF ADMISSION:  01/31/2002  DATE OF DISCHARGE:                                HISTORY & PHYSICAL   CHIEF COMPLAINT:  Shortness of breath.   HISTORY OF PRESENT ILLNESS:  This 70 year old black male with a history of  COPD/asthma was in his usual state of health until last Friday, which was  approximately five days ago when he developed shortness of breath, cough,  and wheezing.  The patient thought that this was consistent with an asthma  exacerbation and presented to the emergency department where he was treated  and released.  His symptoms persisted and then worsening and he presented  back to the emergency department at [facility].  In the  emergency department, the patient was hypoxic with O2 saturations of 69% on  room air and a chest x-ray showed a right lower lobe infiltrate that was new  compared to the prior chest x-ray on January 27, 2002.  The patient's O2  saturations increased with nasal cannula oxygen and he is admitted for  further care.   PAST MEDICAL HISTORY:  1. COPD.  2. Asthma.  3. Allergic rhinitis.  4. Hypertension.  5. GERD.  6. Inguinal hernia.  7. Tobacco abuse.  8. History of elevated liver function tests.  9. Pancytopenia.  10.      Orthostatic hypotension.   CURRENT MEDICATIONS:  1. Singulair 10 mg q.d.  2. Advair, unknown dose, one inhalation b.i.d.  3. Lisinopril/HCTZ 20 mg/12.5 mg q.d.  4. Norvasc 5 mg q.d.  5. Albuterol as needed.  6. Protonix 40 mg q.d.  7. Zyrtec 10 mg q.d.   ALLERGIES:  No known drug allergies.   PAST SURGICAL HISTORY:  Penial prosthesis in 1997.   FAMILY HISTORY:  Father deceased secondary to unknown  poisoning at age 42.  Mother deceased at age 41 secondary to a stroke.  A sister has asthma.   SOCIAL HISTORY:  Single.  Three children.  Former heavy smoker.  Occasional  alcohol on the weekends.   REVIEW OF SYMPTOMS:  Per history of present illness plus the patient denies  chronic headaches, sore throat, weight loss, chest pain, abdominal pain,  nausea, vomiting, diarrhea, blood in his stool or urine, generalized  weakness, easy bruising, or depression.   PHYSICAL EXAMINATION:  VITAL SIGNS:  Blood pressure 152/102, recheck 145/88,  pulse 96-100, respirations 20-42, saturations 69% on room air with increase  to 90% on 4 L nasal cannula, temperature 97.0 degrees.  GENERAL APPEARANCE:  A well-developed, well-nourished male who is alert and  oriented and in no acute  distress.  HEENT:  Pupils equal, responsive, and reactive to light.  Nares clear.  Oropharynx clear.  No masses or lesions noted.  NECK:  Supple.  No adenopathy.  No carotid bruits.  No thyromegaly.  No  increased jugular venous distention.  CARDIOVASCULAR:  Regular rate and rhythm.  S1 and S2 without murmur, rub, or  gallop.  LUNGS:  The patient had bilateral expiratory wheezes, however, he had good  air movement.  ABDOMEN:  Soft.  Bowel sounds present.  Nontender and nondistended.  No  hepatosplenomegaly appreciated.  RECTAL:  Deferred.  EXTREMITIES:  No cyanosis, clubbing, or edema.  Peripheral pulses 2+.  NEUROLOGIC:  Grossly intact.   LABORATORY DATA:  The chest x-ray on January 31, 2002, showed right lower  lobe infiltrate.  Hemoglobin 15.0.  Sodium 133, potassium 3.1, chloride 96,  bicarbonate 37, BUN 8.  ABG with pH 7.46, pCO2 47, pO2 49, bicarbonate 34,  and 86% saturations.   IMPRESSION:  This 70 year old black male is admitted with an asthmatic  exacerbation, as well as community-acquired pneumonia.   PLAN:  1. The patient will be admitted to an intermediate care unit or the ICU if     he worsens.  2. He  will receive IV steroids.  3. He will receive IV antibiotics.  4. He will have a repeat air blood gas to reassess his oxygenation, as well     as his pCO2.  5. He will receive bronchodilators as needed, as well as every two hours as     scheduled.  6. He will have potassium repletion.                                               Armstead Peaks, M.D.    BJ/MEDQ  D:  02/03/2002  T:  02/03/2002  Job:  [id number]

## 2010-09-05 NOTE — H&P (Signed)
[place]. [facility]  Patient:    Steven Beasley, Steven Beasley                    MRN: [id number] Adm. Date:  [id number] Attending:  Osvaldo Human                         History and Physical  CHIEF COMPLAINT:  Shortness of breath.  HISTORY OF PRESENT ILLNESS:  This 70 year old black male with a history of asthmatic bronchitis who presents with five days of worsening shortness of breath associated with a cough productive of thick white sputum.  He has had a nasal discharge with congestion and a sore throat.  He has had some sweats and subjective fever.  He has had no chills.  He has had a feeling of numbness in the middle of his chest, but no true chest pain.  He was seen in the emergency room on January 17, 2000, and given albuterol MDI and amoxicillin.  He was also seen in the emergency room again on January 19, 2000, and given prednisone x 1 dose, and albuterol and Atrovent, and discharged home. He was seen in our office on January 13, 2000, by [facility], and given Claritin for allergy symptoms. He has a history of asthmatic bronchitis in the past.  For the last several weeks at least, he has had a congestive cough productive of clear phlegm, wheezing, and nasal congestion, with a clear discharge.  PAST MEDICAL HISTORY:  Asthmatic bronchitis.  PAST SURGICAL HISTORY:  None.  ALLERGIES:  No known drug allergies.  CURRENT MEDICATIONS: 1. Trimox. 2. Claritin 10 mg q.d. started on January 13, 2000. 3. Albuterol p.r.n.  He is on no chronic medications.  FAMILY HISTORY:  Noncontributory.  SOCIAL HISTORY:  Unmarried.  He has three children.  Tobacco use:  One pack per day fro 30+ years.  Quit one year ago.  Alcohol:  About a six-pack on the weekend.  He works for a Production [employer], theatre/television/film.  REVIEW OF SYSTEMS:  Otherwise negative.  PHYSICAL EXAMINATION:  GENERAL:  A well-appearing, comfortable-appearing black male.  VITAL SIGNS:  Blood pressure  164/97, heart rate 97, respirations 20, temperature 97.7 degrees, oxygen saturation initially 81% on room air.  HEENT:  Pupils equal, round, respond to light.  Extraocular movements intact. Tympanic membranes clear.  Oral cavity and pharynx shows an erythema without exudate.  NECK:  Supple, no lymphadenopathy.  No carotid bruits.  LUNGS:  Show bilateral expiratory wheezes with decreased breath sounds bilaterally.  HEART:  A regular rate and rhythm without murmur, gallop, or rub.  ABDOMEN:  Soft, nontender.  No mass or hepatosplenomegaly.  GENITOURINARY:  Deferred.  RECTAL:  Deferred.  EXTREMITIES:  No edema.  NEUROLOGIC:  Nonfocal.  LABORATORY DATA:  ABG on room air:  pH of 7.41, PCO2 of 39, PO2 of 32.  CBC: Hemoglobin 14.3, platelet count 260, WBC 6.1.  Chemistry:  Sodium 133, potassium 3.3, bicarbonate 32, chloride 91, BUN 14, creatinine 1.9, glucose 114.  Liver function tests were normal.  Chest x-ray shows chronic obstructive pulmonary disease changes without an infiltrate.  Electrocardiogram shows a normal sinus rhythm, left axis deviation, PVCs, nonspecific T-wave changes.  ASSESSMENT: 1. Asthmatic bronchitis with hypoxemia.  Suspect that the blood gas may    be venous, as he had a normal pH with an elevated PCO2. 2. Reactive airway disease. 3. Allergic rhinitis. 4. Mild hypokalemia.  PLAN:  Admit.  IV steroids.  Albuterol nebulizers.  Tequin p.o., Allegra, Humibid, oxygen.  Repeat potassium.  IV fluids.  Planning on discharging the patient on nasal steroids, inhaled steroids, and antihistamines. DD:  01/23/00 TD:  01/23/00 Job: [id number]

## 2010-09-05 NOTE — Discharge Summary (Signed)
[place]. [facility]  Patient:    Steven Beasley, Steven Beasley                    MRN: [id number] Adm. Date:  [id number] Disc. Date: [id number] Attending:  Lillia Mountain                           Discharge Summary  REASON FOR ADMISSION:  Chest pain.  HISTORY OF PRESENT ILLNESS:  This is a 70 year old black male with history of COPD, GERD, and hypertension who presented with several days of upper chest aching and pain on and off which was not necessarily exertional.  It was associated with some shortness of breath.  He had had an increase in his cough productive of clear phlegm and also some wheezing and chest tightness.  He had run out of Protonix, Claritin, and Singulair about one week prior. Nitroglycerin seemed to relieve his discomfort.  PHYSICAL EXAMINATION:  GENERAL APPEARANCE:  A well-appearing black male.  VITAL SIGNS:  Blood pressure was 154/90, heart rate 70, respiratory rate 18, temperature 97.2.  LUNGS:  There was occasional expiratory wheezing.  CARDIOVASCULAR:  Normal.  EXTREMITIES:  No edema.  LABORATORIES:  Sodium 136, potassium 4.4, chloride 97, BUN 9, creatinine 1.1, glucose 92.  CBC showed hemoglobin 13, platelet count 237, wbc 5.5.  CPK 298, CPK-MB 8.1, troponin 0.02.  EKG showed normal sinus rhythm and no STT wave changes.  Chest x-ray was normal.  HOSPITAL COURSE:  The patient was admitted to rule out MI and ischemia.  He was placed on Lovenox, aspirin, and nitroglycerin.  The next morning his enzymes showed CK 260 with an MB of 8.6 and a troponin I of 0.01.  EKG was unchanged.  He was seen by Dr. Katrinka Blazing of cardiology and underwent an exercise Cardiolite test.  The results showed an ejection fraction mildly diminished at 44% but no evidence of ischemia. There was anterior septal hypokinesis noted. The patients chest discomfort had resolved but he did develop wheezing the morning of the second day along with cough productive of  clear phlegm and was placed on prednisone for his asthma.  DISCHARGE DIAGNOSES: 1. Chest pain. 2. Reactive airway disease. 3. Gastroesophageal reflux disease. 4. Hypertension.  PROCEDURES:  Exercise Cardiolite.  DISCHARGE MEDICATIONS: 1. Singulair 10 mg one q.p.m. 2. Advair 250/50 one inhalation twice a day. 3. Protonix 40 mg once a day. 4. Claritin 10 mg once a day. 5. Flonase two sprays each nostril once day. 6. Prednisone 40 mg q.d. x 2 days, 30 mg q.d. x 2 days, 20 mg q.d. x 2 days,    10 mg q.d. x 2 days. 7. Hydrochlorothiazide 12.5 mg q.d. 8. Albuterol MDI two puffs every four to six hours as needed for asthma    symptoms.  ACTIVITY:  Return to work Aug 26, 2000, or Aug 27, 2000, if feeling well.  DIET:  Low sodium.  FOLLOW-UP:  Sep 01, 2000, with Thora Lance, M.D.  He will have to have his blood pressure carefully monitored and possibly placed on an ACE inhibitor for his LV dysfunction. DD:  08/24/00 TD:  08/25/00 Job: [id number]

## 2010-09-05 NOTE — Discharge Summary (Signed)
[place]. [facility]  Patient:    Steven Beasley, Steven Beasley                    MRN: [id number] Adm. Date:  [id number] Disc. Date: 01/26/00 Attending:  Lillia Mountain                           Discharge Summary  HISTORY OF PRESENT ILLNESS:  This is a 70 year old black male who has a history of asthmatic bronchitis.  He presented with five days of worsening shortness of breath associated with cough productive of thick white sputum. He had been having nasal discharge, congestion and sore throat.  He has been having sweats and subjective fever.  He had been seen in the emergency room twice in the preceding week and discharged on antibiotics and nebulizers.   He has had a several week history of cough productive of clear phlegm, wheezing and nasal congestion.  PHYSICAL EXAMINATION:  VITAL SIGNS:  Blood pressure 154/97, heart rate 97, respirations 20, temperature 97.7, oxygen saturation was 81% on room air.  Lungs showed bilateral expiratory wheezes and decreased breath sounds bilaterally.  The rest of the exam was unremarkable.  LABORATORY DATA:  CBC:  WBC 6.1, hemoglobin 14.3, platelet count 260,000. Arterial blood gas on room air: pH 7.41, pCO2 59, pO2 32 (? suspect venous). Chemistries:  Sodium 133, potassium 3.3, chloride 91, bicarbonate 32, glucose 111, BUN 14, creatinine 0.9.  Calcium 9.2, total protein 7.1, albumin 4.2, AST 41, ALT 28, alkaline phosphatase 62, total bilirubin 0.5. Urinalysis was normal.  Radiology:  Chest x-ray showed chronic obstructive pulmonary disease, no infiltrate or effusion.  HOSPITAL COURSE: #1 - ASTHMATIC BRONCHITIS:  The patient was admitted for severe asthmatic bronchitis with hypoxemia.  He was placed on IV steroids, oral Tequin, albuterol nebulizers, Humibid and oxygen at two liters.  He was felt to have a significant allergic rhinitis component and was placed on Claritin.  His potassium was borderline low and he was  given some IV fluids with potassium. By the second hospital day he was feeling somewhat better although his peak inspiratory flow rate was still 100.  His potassium was repleted.  He was continued on the same regimen, and Advair was added for asthma.  On the third hospital day, he was breathing better.  His oxygen was titrated down, and he remained with a normal oxygen saturation of 97% on room air.  Peak inspiratory flow rate on the day of discharge was up to 250 from the low 100s.  He felt much better and was discharged in good condition.  DISCHARGE DIAGNOSES: 1. Asthmatic bronchitis. 2. Allergic rhinitis. 3. Hypokalemia.  PROCEDURES:  None.  DISCHARGE MEDICATIONS: 1. Prednisone 60 mg a day x 2 days, 40 mg a day x 2 days, 20 mg a day x 2    days, and 10 mg a day x 2 days. 2. Tequin 400 mg one p.o. q.d. x 4 days. 3. Duratuss 1200 mg one twice a day x 7 days. 4. Claritin 10 mg once a day. 5. Advair 250/50 one inhalation twice a day every 12 hours. 6. Flonase one spray each nostril once a day. 7. Albuterol 2.5 mg nebulizer four times a day.  ACTIVITY:  As tolerated.  He is not to return to work this week.  DIET:  No restrictions.  FOLLOW-UP:  Follow up January 30, 2000 with Dr. _______.  He was instructed to  check his peak flow twice a day and record it. DD:  01/26/00 TD:  01/26/00 Job: [id number]

## 2010-09-05 NOTE — H&P (Signed)
[place]. [facility]  Patient:    Steven Beasley, Steven Beasley                      MRN: [id number] Adm. Date:  08/23/00 Attending:  Georgann Housekeeper, M.D.                         History and Physical  CHIEF COMPLAINT: Chest pain, shortness of breath.  HISTORY OF PRESENT ILLNESS: The patient is a 70 year old African-American male, who has a history of COPD, hypertension, acid reflux disease, and seasonal allergies, presenting to the emergency room with a two day history of some upper chest wall aching that started on Saturday, about two days ago.  He said he had a little bit of discomfort at home and he did better with albuterol nebulizer treatment.  He states he is out of his COPD medications as well as his Protonix for about a week now and the only thing he has is albuterol nebulizer at home.  He went to work and started having the aching sensation again.  He says it was different from his COPD and as far as number he said it was 10/10 with some shortness of breath, and he came to the emergency room.  The patient denies any nausea, vomiting, or diarrhea.  No fever or chills.  He had some postnasal drip and some occasional cough and occasional wheezing.  In the emergency room he was wheezing and had a nebulizer treatment, which improved his wheezing but his chest aching was still present.  He received three sublingual nitroglycerin, which relieved his pain completely and became 0/10.  At the time of examination he was pain-free.  PAST MEDICAL HISTORY:  1. COPD.  2. Hypertension.  3. Acid reflux disease.  4. Tobacco abuse in the past.  CURRENT MEDICATIONS:  1. Singulair 10 mg q.d.  2. Claritin 10 mg q.d.  3. Protonix 40 mg q.d.  4. Albuterol nebulizer (he had this and was taking it p.r.n.).  5. Flonase nasal spray.  6. Hydrochlorothiazide 12.5 mg q.d.  He was out of all of his medications for about a week.  ALLERGIES: None.  PAST SURGICAL HISTORY: Penile  prosthesis in October 1997.  FAMILY HISTORY: Father died of poisoning at 92.  Mother died at 55 with stroke.  History of asthma in a sister.  SOCIAL HISTORY: He is single.  Has three children.  Works at [employer].  Very active.  No alcohol.  Quit tobacco in 2000, smoked for about 20 years.  REVIEW OF SYSTEMS: As mentioned above.  PHYSICAL EXAMINATION:  VITAL SIGNS: Blood pressure 154/90, pulse 90, temperature 97.2 degrees, respirations 18.  Oxygen saturation 97% on room air.  GENERAL: Well-appearing male in no acute distress, alert and oriented.  HEENT: Normoactive, atraumatic.  PERRL.  EOMI.  Oropharynx clear.  Some ______ in posterior pharynx.  NECK: No JVD, no carotid bruits.  Neck supple.  LUNGS: Occasional expiratory wheezing.  CARDIAC: Regular, S1 and S2, without murmurs, rubs, or gallops.  ABDOMEN: Soft, positive bowel sounds, nontender, nondistended.  EXTREMITIES: No edema.  NEUROLOGIC: Nonfocal.  LABORATORY DATA: CPK 298 with MB 8.1, relative index 2.7.  Troponin was 0.02, which was negative.  MB and CK were positive.  Chemistries showed a sodium of 136, potassium 4.4, BUN 9, creatinine 1.1, glucose 92.  WBC 5.5, hemoglobin 13.2, hematocrit 38.9; platelets 237,000.  EKG showed normal sinus rhythm without ST-T wave changes,  right atrial enlargement.  Chest x-ray was negative for CHF or infiltrates.  ASSESSMENT: This patient is a 70 year old black male with positive risk factors for coronary artery disease including hypertension, tobacco abuse, age, presenting with atypical symptoms, with relief of symptoms with nitroglycerin.  CK total is positive with negative troponin (first one).  PLAN: Chest pain, cardiac versus noncardiac.  Given the above presentation I will put him on telemetry and rule out for MI with serial enzymes and EKG. Start nitroglycerin drip and Lovenox.  I will hold beta-blocker because of his history of COPD.  Probably will need  cardiology consultation in the morning. I will also start him on Protonix and for his COPD continue on albuterol and Singulair. DD:  08/23/00 TD:  08/24/00 Job: [id number]

## 2010-09-05 NOTE — Discharge Summary (Signed)
NAME:  Steven Beasley, Steven Beasley                       ACCOUNT NO.:  [id number]   MEDICAL RECORD NO.:  [id number]                   PATIENT TYPE:  INP   LOCATION:  5511                                 FACILITY:  [facility]   PHYSICIAN:  Thora Lance, M.D.               DATE OF BIRTH:  05/09/1940   DATE OF ADMISSION:  01/31/2002  DATE OF DISCHARGE:  02/05/2002                                 DISCHARGE SUMMARY   HISTORY OF PRESENT ILLNESS:  The patient is a 70 year old black male with  history of chronic obstructive pulmonary disease/asthma who had developed  shortness of breath, cough and wheezing.  He was found to be hypoxic in the  emergency room with 69% room air saturation.  Chest x-ray suggested a right  lower lobe infiltrate and the patient was admitted.   SIGNIFICANT PHYSICAL EXAMINATION:  VITAL SIGNS: Blood pressure 152/102.  Heart rate 76.  Respirations 20.  Oxygen saturation 69% on room air.  Temperature 97.0.  LUNGS:  Show bilateral wheezes, good air movement.   LABORATORY DATA:  ABG's show pH 7.46, pCO2 47, pO2 49 on room air.  CBC with  white blood cell count 6.4, hemoglobin 13.8, platelet count 307,000.  Chemistries show sodium 133, potassium 3.1, chloride 96, glucose 180, BUN 8,  creatinine 1.1, calcium 9.4.  The chest x-ray showed a right lower lobe  infiltrate.   HOSPITAL COURSE:  The patient was admitted for asthmatic bronchitis and  possible pneumonia.  The patient was started on intravenous steroids and  nebulizers, intravenous Tequin and oxygen.  By February 03, 2002 the patient  was feeling significantly better with less chest tightness.  His wheezing  had improved.  Blood cultures showed one out of two sets GPC's in clusters  which was felt to be contaminant.  The patient was switched to oral  medications.  He was discharged to home on February 05, 2002.   DISCHARGE DIAGNOSES:  1. Asthmatic bronchitis.  2. Pneumonia.  3. Hypertension.  4. Gastroesophageal reflux  disease.  5. Allergic rhinitis.   DISCHARGE MEDICATIONS:  1. Prednisone 20 mg three q.d. for two days, two q.d. for two days, one q.d.     for two days and then one-half q.d. for two days.  2. Tequin 400 mg one p.o. q.d. X4 days.  3. Duratuss-D 1200 mg one b.i.d.  4. Albuterol nebulizer 2.4 mg q.4h. PRN.  5. Singulair 10 mg q.d.  6. Advair 250/50 one inhalation b.i.d.  7. Lisinopril/hydrochlorothiazide 20/12.5 mg p.o. q.d.  8. Norvasc 5 mg q.d.  9. Protonix 40 mg q.d.  10.      Zyrtec 10 mg q.d.  11.      Flonase two q.d.   ACTIVITY:  As tolerated.   DIET:  Low in sodium.   RETURN TO WORK:  February 09, 2002.   FOLLOW UP:  Follow up early November 2003 with Dr. Kirby Funk.  Thora Lance, M.D.    Delorse Limber  D:  03/07/2002  T:  03/07/2002  Job:  [id number]

## 2010-09-05 NOTE — Op Note (Signed)
NAME:  Steven Beasley, Steven Beasley             ACCOUNT NO.:  [id number]   MEDICAL RECORD NO.:  [id number]          PATIENT TYPE:  AMB   LOCATION:  DAY                          FACILITY:  [facility]   PHYSICIAN:  Sigmund I. Patsi Sears, M.D.DATE OF BIRTH:  12/26/1940   DATE OF PROCEDURE:  12/08/2004  DATE OF DISCHARGE:                                 OPERATIVE REPORT   PREOPERATIVE DIAGNOSIS:  Erectile dysfunction, malfunction penile  prosthesis.   POSTOPERATIVE DIAGNOSIS:  Erectile dysfunction, malfunction penile  prosthesis.   OPERATION/PROCEDURE:  1.  Removal of semi-rigid penile prosthesis.  2.  Insertion of penile prosthesis.   SURGEON:  Sigmund I. Patsi Sears, M.D.   ASSISTANT:  Lindaann Slough, M.D., Glade Nurse, M.D.   ANESTHESIA:  General.   SPECIMENS:  Semi-rigid penile prosthesis.   DESCRIPTION OF PROCEDURE:  The patient was identified by his wrist bracelet  and brought to operating room.  He received preoperative antibiotics and  administered general anesthesia.  He was prepped and draped in the usual  sterile fashion.  A 4 cm penoscrotal incision was made along the midline  raphe.  We carried down sharply through dartos fascia with Bovie cautery,  being careful to elevate the tissue away from the urethra.  We bluntly  dissected __________  laterally to expose the corporeal bodies bilaterally.  We addressed the right corporeal body at the level of the penile scrotal  obstruction.  Vicryl stay sutures were placed approximately 1 cm away from  each other and we made a corporotomy with Bovie cautery.  The penile  prosthesis was identified and a right angle clamp was placed behind it and  it was removed including the rear-tip applicators.  Next, we addressed the  left corporeal body again  Vicryl stay sutures were placed at the level of  the penile scrotal junction.  Corporotomy was made with Bovie cautery.  We  then placed a right-angle clamp behind the penile prosthesis and  removed it  including the rear-tip extenders  Following this, we redilated the patient's  corporeal bodies bilaterally, both proximally and distally using Mentor  dilators starting at 11-French and moving sequentially to 13-French.  This  was done without any difficulty. Next we sized the patient's corporeal  bodies. The left was 14 cm distal and 12 proximal.  The right was 14 cm and  11 proximal for a total of 26 and 25 respectively.  Next, a bilateral 13 mm  diameter Genesis semi-rigid penile prosthesis was inflated.  We initially  sized both prostheses for a total length of 26, but found, with size  adjustments, that the device seated better and obtained adequate distal  length bilaterally with bilateral 0.25 cm rear-tip extenders for a total of  26.5 bilaterally.   Next, the wound was copiously irrigated with antibiotic solution and each  corporotomy was closed with a running 2-0 Vicryl.  Next, to insure there was  excellent hemostasis in the wound, the wound was closed in two layers with a  running 3-0 Vicryl closing the dartos fascia. Then a running 4-0 Vicryl to  close the skin with a running stitch.  Next,  dressings were applied to both the penis and the wound.  The patient was  reversed on his anesthesia which he tolerated without complications.   Please note that Dr. Jethro Bolus was present and participated in all  aspects of this case.     ______________________________  Glade Nurse, MD      Sigmund I. Patsi Sears, M.D.  Electronically Signed    MT/MEDQ  D:  12/08/2004  T:  12/08/2004  Job:  [id number]

## 2010-09-05 NOTE — Op Note (Signed)
NAME:  Steven Beasley, Steven Beasley                       ACCOUNT NO.:  [id number]   MEDICAL RECORD NO.:  [id number]                   PATIENT TYPE:  AMB   LOCATION:  DSC                                  FACILITY:  [facility]   PHYSICIAN:  Velora Heckler, M.D.                DATE OF BIRTH:  25-Jan-1941   DATE OF PROCEDURE:  07/25/2002  DATE OF DISCHARGE:                                 OPERATIVE REPORT   PREOPERATIVE DIAGNOSIS:  Right inguinal hernia.   POSTOPERATIVE DIAGNOSIS:  Right inguinal hernia.   PROCEDURE:  Repair, right inguinal hernia, with Prolene mesh.   SURGEON:  Velora Heckler, M.D.   ANESTHESIA:  General.   ESTIMATED BLOOD LOSS:  Minimal.   PREPARATION:  Betadine.   COMPLICATIONS:  None.   INDICATIONS:  The patient is a pleasant 70 year old black male who presented  initially in January 2003 with right inguinal hernia.  This had been present  for several months.  It caused him limited discomfort.  At that time the  patient deferred repair.  He returned to my practice in February 2004.  By  this time the hernia had increased in size and was causing him mild  discomfort.  He had no obstructive symptoms.  He desired elective repair.   DESCRIPTION OF PROCEDURE:  The procedure was done in OR #3 at the [facility] day  surgery center.  The patient was brought to the operating room and placed in  a supine position on the operating room table.  Following administration of  general anesthesia, the patient is prepped and draped in the usual strict  aseptic fashion.  After ascertaining that an adequate level of anesthesia  had been obtained, a right inguinal incision was made with a #15 blade.  Dissection is carried down through the subcutaneous tissues.  Hemostasis was  obtained with the electrocautery.  Dissection was carried down to the  external oblique fascia.  The fascia was incised in line with its fibers and  extended through the external inguinal ring.  Cord structures are  encircled  with a Penrose drain.  There is an obvious hernia sac present along the cord  structures.  The hernia sac is dissected out up to the level of the internal  inguinal ring.  It is then inverted back within the peritoneal cavity and  the inguinal ring is closed with an interrupted 3-0 Vicryl suture.  The  floor of the inguinal canal is attenuated.  Using a sheet of Prolene mesh,  the mesh was cut to the appropriate dimensions.  It was secured to the pubic  tubercle and along the inguinal ligament with a running 2-0 Novofil suture.  The mesh was split to accommodate the cord structures.  Superior margin of  the mesh was secured to the transversalis and internal oblique fascia with  interrupted 2-0 Novofil sutures.  The tails of the mesh were overlapped  lateral to  the cord structures and the inferior edges of the tails of the  mesh were secured to the inguinal ligament with an interrupted 2-0 Novofil  suture.  A local field block was placed with Marcaine.  Cord structures are  returned to the inguinal canal.  External oblique fascia is closed with  interrupted 3-0 Vicryl sutures.  The subcutaneous tissues are reapproximated  with interrupted 3-0 Vicryl sutures.  Skin edges are anesthetized with local  anesthetic.  Skin edges are closed with a running 4-0 Vicryl subcuticular  suture.  The wound is washed and dried and Benzoin and Steri-Strips are  applied.  Sterile gauze dressings are applied.  The patient is awakened from  anesthesia and brought to the recovery room in stable condition.  The  patient tolerated the procedure well.                                               Velora Heckler, M.D.    TMG/MEDQ  D:  07/25/2002  T:  07/25/2002  Job:  [id number]   cc:   Thora Lance, M.D.  [address]  [place]  [place] [address]  Fax: [phone]   [facility]

## 2010-09-05 NOTE — H&P (Signed)
[place]. [facility]  Patient:    [name], [name]                    MRN: [id number] Adm. Date:  [id number] Attending:  Lillia Mountain                         History and Physical  IDENTIFYING DATA:  Mr. Steven Beasley is a 70 year old black male followed by Dr. Kirby Funk. He has a history of COPD. He has no other chronic medical problems. He states he started feeling bad approximately a week ago. He chronically is on a hand held nebulizer with albuterol at home. He noticed increased shortness of breath, some achiness in his shoulders and chest. He has complained of a cough productive of white sputum and has noticed wheezing. He has had no fever, but at night he occasionally has some sweats. He has complained of approximately a 10 pound weight loss over the last month. He has been in the ER approximately three days ago. He was discharged and states he has not been feeling any better.  ALLERGIES:  No known drug allergies.  CURRENT MEDICATIONS:  Flonase one puff each nostril q.d., Claritin 10 mg q.h.s., he is on hand held nebulizer treatment for what sounds like albuterol, at home.  PAST MEDICAL HISTORY:  Remarkable for COPD. He stopped smoking in the year 2000. He has a 20 pack year history. No history of coronary artery disease, cancer, diabetes or hypertension.  PREVIOUS SURGERIES:  He has had a penile prosthesis in October 1997.  FAMILY HISTORY:  Father died of a poisoning at the age of 70. Mother died at age 11 of a stroke. He has four sisters and four brothers. One sister has asthma, everyone else is in fairly good health.  SOCIAL HISTORY:  He is single. Works at [employer]. Denies alcohol use. He has three children in good health.  REVIEW OF SYSTEMS:  No headache. He has had no change in his vision or his hearing. PULMONARY:  Please see HPI. Denies any anterior chest pain. No abdominal pain. States bowels and urine are moving  normally.  PHYSICAL EXAMINATION:  VITAL SIGNS:  Blood pressure 178/102, pulse 101, respiratory rate 28, temperature 97.9. O2 saturation 94%.  SKIN:  Warm and dry.  HEENT:  Pupils equal, round and reactive to light. Disks are sharp. Vasculature normal. TMs normal. Oropharynx moist.  NECK:  No JVD or thyromegaly.  LUNGS:  Markedly decreased breath sounds with bilateral wheezes. He has a congested cough.  HEART:  Regular rate and rhythm without murmur.  ABDOMEN:  Soft. No hepatosplenomegaly or masses palpated.  EXTREMITIES:  No lower extremity edema.  NEUROLOGIC:  Nonfocal.  Chest x-ray consistent with severe COPD.  Blood gas 7.4, PCO2 51, PO2 81 on 2 liters. Sodium 136, potassium 3.7, chloride 98, bicarbonate 31, BUN 15, creatinine 1.0, glucose 111. CBC pending. AST 35, ALT 23, alkaline phosphatase 61, total bilirubin 0.5.  ASSESSMENT: 1. Severe chronic obstructive pulmonary disease. 2. Chronic obstructive pulmonary disease exacerbation, possibly viral, dust,    pollen or mold. Will need IV steroids, hand held nebulized albuterol. 3. Elevated blood pressure. Question if he has hypertension or just elevated    blood pressure due to the stress of the acute illness.  PLAN:  Will admit. Will begin IV Solu-Medrol. Will give him hand held nebulizer treatments with albuterol. Will suggest reassessing his pulmonary function test if  this has not been done within the year for prognostic reasons. I fear he has end-stage COPD. DD:  02/21/00 TD:  02/22/00 Job: [id number]

## 2010-10-21 ENCOUNTER — Inpatient Hospital Stay (HOSPITAL_COMMUNITY)
Admission: EM | Admit: 2010-10-21 | Discharge: 2010-10-24 | DRG: 641 | Disposition: A | Discharge status: Home Health | Payer: Medicare Other | Attending: Internal Medicine | Admitting: Internal Medicine

## 2010-10-21 ENCOUNTER — Emergency Department (HOSPITAL_COMMUNITY): Payer: Medicare Other

## 2010-10-21 DIAGNOSIS — S0190XA Unspecified open wound of unspecified part of head, initial encounter: Secondary | ICD-10-CM | POA: Diagnosis present

## 2010-10-21 DIAGNOSIS — J309 Allergic rhinitis, unspecified: Secondary | ICD-10-CM | POA: Diagnosis present

## 2010-10-21 DIAGNOSIS — Z8546 Personal history of malignant neoplasm of prostate: Secondary | ICD-10-CM

## 2010-10-21 DIAGNOSIS — I10 Essential (primary) hypertension: Secondary | ICD-10-CM | POA: Diagnosis present

## 2010-10-21 DIAGNOSIS — F3289 Other specified depressive episodes: Secondary | ICD-10-CM | POA: Diagnosis present

## 2010-10-21 DIAGNOSIS — E46 Unspecified protein-calorie malnutrition: Secondary | ICD-10-CM | POA: Diagnosis present

## 2010-10-21 DIAGNOSIS — E871 Hypo-osmolality and hyponatremia: Principal | ICD-10-CM | POA: Diagnosis present

## 2010-10-21 DIAGNOSIS — D696 Thrombocytopenia, unspecified: Secondary | ICD-10-CM | POA: Diagnosis present

## 2010-10-21 DIAGNOSIS — R55 Syncope and collapse: Secondary | ICD-10-CM | POA: Diagnosis present

## 2010-10-21 DIAGNOSIS — J45909 Unspecified asthma, uncomplicated: Secondary | ICD-10-CM | POA: Diagnosis present

## 2010-10-21 DIAGNOSIS — G589 Mononeuropathy, unspecified: Secondary | ICD-10-CM | POA: Diagnosis present

## 2010-10-21 DIAGNOSIS — F1010 Alcohol abuse, uncomplicated: Secondary | ICD-10-CM | POA: Diagnosis present

## 2010-10-21 DIAGNOSIS — J40 Bronchitis, not specified as acute or chronic: Secondary | ICD-10-CM | POA: Diagnosis present

## 2010-10-21 DIAGNOSIS — E876 Hypokalemia: Secondary | ICD-10-CM | POA: Diagnosis present

## 2010-10-21 DIAGNOSIS — K219 Gastro-esophageal reflux disease without esophagitis: Secondary | ICD-10-CM | POA: Diagnosis present

## 2010-10-21 DIAGNOSIS — F329 Major depressive disorder, single episode, unspecified: Secondary | ICD-10-CM | POA: Diagnosis present

## 2010-10-21 LAB — COMPREHENSIVE METABOLIC PANEL
ALT: 77 U/L — ABNORMAL HIGH (ref 0–53)
AST: 119 U/L — ABNORMAL HIGH (ref 0–37)
Alkaline Phosphatase: 67 U/L (ref 39–117)
CO2: 32 mEq/L (ref 19–32)
GFR calc Af Amer: >60 mL/min (ref >60)
Glucose, Bld: 101 mg/dL — ABNORMAL HIGH (ref 70–99)
Potassium: 2.8 mEq/L — ABNORMAL LOW (ref 3.5–5.1)
Sodium: 119 mEq/L — CL (ref 135–145)
Total Protein: 6.6 g/dL (ref 6.0–8.3)

## 2010-10-21 LAB — CBC
MCH: 32.2 pg (ref 26.0–34.0)
MCHC: 37.6 g/dL — ABNORMAL HIGH (ref 30.0–36.0)
MCV: 85.5 fL (ref 78.0–100.0)
Platelets: 158 10*3/uL (ref 150–400)
RDW: 11.8 % (ref 11.5–15.5)
WBC: 4.2 10*3/uL (ref 4.0–10.5)

## 2010-10-21 LAB — TSH: TSH: 1.236 u[IU]/mL (ref 0.350–4.500)

## 2010-10-21 LAB — DIFFERENTIAL
Eosinophils Absolute: 0 10*3/uL (ref 0.0–0.7)
Eosinophils Relative: 0 % (ref 0–5)
Lymphs Abs: 0.6 10*3/uL — ABNORMAL LOW (ref 0.7–4.0)
Monocytes Absolute: 0.8 10*3/uL (ref 0.1–1.0)

## 2010-10-22 LAB — BASIC METABOLIC PANEL
BUN: 12 mg/dL (ref 6–23)
GFR calc Af Amer: >60 mL/min (ref >60)
GFR calc non Af Amer: >60 mL/min (ref >60)
Potassium: 3.3 mEq/L — ABNORMAL LOW (ref 3.5–5.1)
Sodium: 118 mEq/L — CL (ref 135–145)

## 2010-10-22 LAB — URIC ACID, RANDOM URINE: Uric Acid, Urine: 54.8 mg/dL

## 2010-10-22 LAB — OSMOLALITY, URINE: Osmolality, Ur: 560 mOsm/kg (ref 390–1090)

## 2010-10-23 LAB — CBC
MCH: 31.6 pg (ref 26.0–34.0)
Platelets: 162 10*3/uL (ref 150–400)
RBC: 3.48 MIL/uL — ABNORMAL LOW (ref 4.22–5.81)
WBC: 3.8 10*3/uL — ABNORMAL LOW (ref 4.0–10.5)

## 2010-10-23 LAB — BASIC METABOLIC PANEL
CO2: 30 mEq/L (ref 19–32)
Calcium: 8.2 mg/dL — ABNORMAL LOW (ref 8.4–10.5)
Sodium: 129 mEq/L — ABNORMAL LOW (ref 135–145)

## 2010-10-23 LAB — ACTH: C206 ACTH: 25 pg/mL (ref 10–46)

## 2010-10-24 LAB — BASIC METABOLIC PANEL
CO2: 30 mEq/L (ref 19–32)
Calcium: 8.6 mg/dL (ref 8.4–10.5)
GFR calc Af Amer: >60 mL/min (ref >60)
GFR calc non Af Amer: >60 mL/min (ref >60)
Sodium: 132 mEq/L — ABNORMAL LOW (ref 135–145)

## 2010-10-30 NOTE — H&P (Signed)
NAME:  Steven Beasley, Steven Beasley NO.:  [id number]  MEDICAL RECORD NO.:  [id number]  LOCATION:  [facility]                         FACILITY:  [facility]  PHYSICIAN:  Jonny Ruiz, MD    DATE OF BIRTH:  [birth year]  DATE OF ADMISSION:  10/21/2010 DATE OF DISCHARGE:                             HISTORY & PHYSICAL   CHIEF COMPLAINT:  Hyponatremia.  HISTORY OF PRESENT ILLNESS:  The patient is a 70 year old African American man who was brought by EMS after he passed out while waiting for the bus this morning at around 11:30.  The patient sustained a laceration in his face which has been already repaired in the Emergency Department.  The patient does not know why he passed out, he just complains of weakness in both legs for the last several days.  He specifically denied chest pain, shortness of breath, palpitations, lightheadedness, dizziness prior to this event.  He has no history of seizures, however he states that this is second time he falls.  The patient underwent an initial evaluation in the Emergency Department and was found with a sodium of 119 and a potassium of 2.8.  I was called to further evaluate and manage.  PAST MEDICAL HISTORY:  Significant for, 1. Asthma. 2. Hypertension. 3. Prostate cancer.  MEDICATIONS:  Albuterol inhaler, Detrol, Effexor, Flomax, and lisinopril.  The doses are pending.  The patient does not recall.  PRIMARY CARE PHYSICIAN:  [facility].  ALLERGIES:  No known drug allergies.  FAMILY HISTORY:  Noncontributory.  SOCIAL HISTORY:  The patient is unemployed.  Lives on social security benefits.  He lives by himself.  Has 5 children, all grown.  He tells me that he likes to drink beer on weekends only.  Denies hard liquor. Denies tobacco.  He quit many years ago.  Denies illicit.  REVIEW OF SYSTEMS:  CONSTITUTIONAL:  Denies fevers, chills, night sweats, fatigue, or malaise complaints.  Positive for weakness of the lower extremities.  Denies  weight loss or weight gain. EYES:  Denies double vision, blurred vision, or scotoma. ENT:  All negative. CARDIOVASCULAR:  Denies chest pain, shortness of breath, palpitations, orthopnea, nocturia, or PND. RESPIRATORY:  Denies cough, wheezes, or hemoptysis. GASTROINTESTINAL:  Denies abdominal pain, nausea, vomiting, diarrhea, constipation, hematochezia, or melena. GENITOURINARY:  Denies dysuria frequency, or hematuria. MUSCULOSKELETAL:  Denies arthralgias or myalgias.  He has only complain of pain in his neck at this time. SKIN:  Denies discoloration, jaundice, or skin growth. NEUROLOGIC:  Significant for the syncopal episode this morning with numbness in the right lower extremity which has gone by now. ENDOCRINE:  The patient denies heat or cold intolerance.  Weight gain or weight loss.  Denies history of diabetes. HEMATOLOGIC:  Denies history of bruises, blood clot, or blood disorders.  PHYSICAL EXAMINATION:  VITAL SIGNS:  The patient's blood pressure is 109/71, pulse 95, respirations 18, temp 97.5, and pulse ox 97% on room air. GENERAL APPEARANCE:  The patient is a thin, African American man who is lying on the stretcher in no acute distress.  He is awake, alert, and oriented x3. HEENT:  Significant for a left subconjunctival hemorrhage and a repaired laceration on the left side of his mandible  which has been repaired and has clean dressing. NECK:  Supple without JVD or lymphadenopathy.  Thyroid is normal in size and without nodules. HEART:  Regular S1 and S2 without gallops, murmurs, or rubs. LUNGS:  Clear to auscultation. ABDOMEN:  Soft, nontender without organomegaly or masses palpable. EXTREMITIES:  Without clubbing, cyanosis, or edema. NEUROLOGIC:  Cranial nerves II through XII intact.  Moderate strength 5/5 in the upper and lower extremities.  Reflexes 2+, patellar.  No Babinski.  Sensory, grossly intact.  No meningeal signs. SKIN MUCOSA:  No jaundice, telangiectasias, or  spider angiomas.  LABORATORY DATA:  WBC 4.2, hemoglobin 12.0, hematocrit 31.9, MCV 85.5, and platelet count 158.  Sodium 119, potassium 2.8, chloride 77, carbon dioxide 32, BUN 15, creatinine 1.16, calcium 9.2, total protein 616, albumin 3.9, AST 119, ALT 77, alk phos 67, and total bilirubin 0.5.  UA is pending.  Head CT pending.  EKG pending.  IMPRESSION/PLAN: 1. Fall/syncope.  Etiology appears to be related to his severe     hyponatremia at this time.  He has no history of cardiac nature for     his syncope.  He has a nonfocal neurological examination and I     doubt this was a transient ischemic attack and differential     diagnosis includes seizure disorder. 2. Hyponatremia.  My initial clinical impression is due to heavy beer     consumption (potomania).  Evaluation includes a UA, urine     osmolality, serum osmolality, urine sodium, as well as a basal ACTH     level and in the morning, we will administer ACTH 250 mcg IV bolus     followed by a cortisol level in 60 minutes, rule out adrenal     insufficiency.  In addition, we will obtain a TSH and a uric acid     level in serum.  We will hydrate him with normal saline at 150 mL     an hour and repeat a B-MET in a.m. 3. Hypokalemia which could be related to hyponatremia, beer drinkers     potomania.  We will replace him via oral KCl 40 mEq p.o. q.8 h. and     adjust according to the results of B-MET in the morning.  Obtain     magnesium with his today's labs. 4. Mild thrombocytopenia, might be from hypersplenism in a patient     with long standing history of alcohol use.  It correlates with his     AST of 119 over ALT of 77.  We will just monitor for now.     Repeating another liver function tests and CBC in couple of days.     Of most important is the possibility of his alcohol abuse for which     I am going to ask for a Child psychotherapist behavioral consultation. 5. History of hypertension.  We will withhold lisinopril for the time      being since he is running borderline low blood pressures. 6. History of prostate cancer.  I see the patient is taking BPH drugs.     We will withhold the Flomax and Detrol because of his low blood     pressure.  If he develops (LUTS) lower urinary tract symptom, we     will receive him if     his blood pressure permits. 7. Disposition.  The patient will be admitted to telemetry floor.     Full code.  Consultation with substance abuse.  He is going to  team     4.          ______________________________ Jonny Ruiz, MD     GL/MEDQ  D:  10/21/2010  T:  10/21/2010  Job:  [id number]  Electronically Signed by Jonny Ruiz MD on 10/30/2010 04:53:29 PM

## 2010-11-07 NOTE — Discharge Summary (Signed)
NAMEBURLEIGH, Steven Beasley NO.:  [id number]  MEDICAL RECORD NO.:  [id number]  LOCATION:  3708                         FACILITY:  [facility]  PHYSICIAN:  Rosanna Randy, MDDATE OF BIRTH:  04/17/1941  DATE OF ADMISSION:  10/21/2010 DATE OF DISCHARGE:  10/24/2010                              DISCHARGE SUMMARY   DISCHARGE DIAGNOSES: 1. Syncope secondary to hyponatremia. 2. Hyponatremia secondary to alcohol consumption. 3. Hypomagnesemia and hypokalemia, also secondary to alcohol     consumption. 4. Alcohol abuse. 5. Thrombocytopenia secondary to alcohol. 6. Protein-calorie malnutrition with decreased appetite. 7. Asthma. 8. Hypertension. 9. History of prostate cancer. 10.Depression. 11.Gastroesophageal reflux disease. 12.Neuropathy. 13.Allergic rhinitis. 14.Mild bronchitis.  DISCHARGE MEDICATIONS: 1. Ensure 237 mL by mouth twice a day. 2. Folic acid 1 mg by mouth daily. 3. Mucinex 600 mg by mouth twice a day. 4. Thiamine 100 mg tablet by mouth daily. 5. Allegra 180 mg 1 tablet by mouth daily. 6. Detrol LA 4 mg 1 capsule by mouth daily. 7. Effexor 75 mg 1 capsule by mouth daily. 8. Flomax 0.4 mg 1 capsule by mouth daily. 9. Flonase 50 mcg nasal spray, 2 sprays nasally daily. 10.Gabapentin 300 mg 1 capsule by mouth three times a day. 11.Nexium 40 mg 1 capsule by mouth daily. 12.Norvasc 10 mg 1 tablet by mouth daily. 13.Ventolin inhaler 90 mcg 2 puffs every 4 hours as needed for     wheezing and shortness of breath.  DISPOSITION AND FOLLOWUP:  The patient had been discharged in stable improved and condition currently without any syncope/near syncope, orthostasis changes, chest pain, shortness of breath, or any other complaints.  The patient is going to be seen by Dr. Suzie Portela at the [facility] with an appointment on November 11, 2010, at 3:45 p.m.  It will be important during this appointment to review basic metabolic panel in order to follow on the patient's  electrolytes and also to had a magnesium level to make sure that he had no further problems with hypomagnesemia.  It will be important to review that the patient is compliant with his alcohol abstinence and also important to review the patient's blood pressure.  After he experienced a syncope episode that brought into the hospital, his blood pressure was soft and the decision to stop his lisinopril was taken.  He only used throughout the whole hospitalization amlodipine and his blood pressure was stable and well controlled.  It will be important to review it just to make sure that he does not require to be started back on it.  PROCEDURE PERFORMED DURING THIS HOSPITALIZATION:  The patient had a CT of the head on October 21, 2010, that demonstrated mild atrophy with no acute intracranial findings.  No other procedures were performed during this hospitalization.  No consultations were made.  HISTORY OF PRESENT ILLNESS:  For full details, please refer to dictation done by Dr. Jonny Ruiz on October 21, 2010, but briefly the patient is a 70 year old African American male who was brought by EMS after he passed out while waiting for the bus this morning around 11:30 in the morning.  The patient sustained a laceration in his face which was treated and repaired in the  emergency department.  The patient does not know why he passed out.  He just complained of weakness in both legs for the last several days.  He specifically denied any chest pain, shortness of breath, palpitations, lightheadedness, dizziness prior to this event. He has no history of seizure disorders.  However, he stated that this is the second time that he fell.  The patient underwent an initial evaluation in the emergency department and was found with a sodium of 119 and a potassium 2.8.  [facility] was called for admission and to provide further evaluation and treatment.  PERTINENT LABORATORY DATA THROUGHOUT THIS  HOSPITALIZATION:  Included a comprehensive metabolic panel on admission with a sodium of 119, potassium 2.8, chloride 77, bicarb 32, glucose 101, BUN 15, creatinine 1.16.  CBC with differential demonstrated a white blood cells of 4.2, hemoglobin 12.0, platelets 158.  PT 12.5, INR 0.91, magnesium was 1.4, TSH 1.236.  Serum osmolality 244, urine osmolality 560, uric acid 54.8. BMET on subsequent date showed a sodium of 118, potassium 3.3, chloride 82, bicarb 29, glucose 100, BUN 12, creatinine 0.76.  The patient had a random cortisol level that was 29.3.  ACTH/adrenocorticotropic stimulation test was 25, which is within the normal range.  BMET at the moment of discharge demonstrated a sodium of 132, potassium 4.1, chloride 96, bicarb 36, glucose 109, BUN 5, creatinine 0.62.  HOSPITAL COURSE BY PROBLEM: 1. Fall/syncope secondary to hyponatremia.  At this point,     electrolytes have been repleted and the patient is feeling back to     his baseline and normal.  The only abnormality that was playing in     this electrolyte disorder is the patient's alcohol consumption, but     is also seen the patient's transaminitis with an AST of 119 and an     ALT of 77.  The patient received counseling.  He was followed for     72 hours to make sure that he does not experience any withdrawal     symptoms and he had been placed on vitamin B1 and also folic acid.     The patient repletion of his potassium and also repletion of his     magnesium, and at this point he is going to be discharged with a     close followup at the [facility] by Dr. Suzie Portela. 2. Low potassium, low magnesium secondary to alcohol consumption, at     this point repleted. 3. Protein-calorie malnutrition.  The patient had been started on     Ensure. 4. Mild thrombocytopenia secondary to his alcohol consumption and also     the initial use of Lovenox, stable and pretty much in normal range     at the moment of discharge. 5.  Gastroesophageal reflux disease.  We are going to continue using     Nexium. 6. Neuropathy.  Plan is to continue using gabapentin as previously     instructed. 7. Asthma with mild bronchitis.  At this moment, no fever or elevation     in white blood cells, so there is no requirement for antibiotics.     We are going to place the patient on Mucinex and he will continue     using his albuterol and also his Flonase. 8. Allergic linitis.  We are going to continue using Allegra. 9. Depression.  We are going to continue using Effexor. 10.History of prostate cancer with some bladder discomfort.  The     patient will continue using  a Detrol and also Flomax. 11.Hypertension, which was soft on the moment of admission, and now     that he had not been having any alcohol consumption is in the     normal range just with the use of Norvasc.  The patient had     discontinuation of his lisinopril and at the moment he follow with     primary care physician on November 11, 2010, it will be important to     repeat vital signs to check on his blood pressure and determine if     further treatment adding lisinopril is required.  VITAL SIGNS:  At discharge, the patient's temperature was 98.17, heart rate 88, respiratory rate 18, blood pressure 148/95, oxygen saturation was 99% on room air. GENERAL:  The patient was in general in no acute distress. RESPIRATORY:  With just minimal wheezing. HEART:  Regular rate and rhythm.  No murmurs, gallops or rubs. ABDOMEN:  Soft, nontender, nondistended with positive bowel sounds. EXTREMITIES:  There was no edema. NEUROLOGIC:  Nonfocal.     Rosanna Randy, MD     CEM/MEDQ  D:  10/24/2010  T:  10/25/2010  Job:  [id number]  cc:   [facility]  Electronically Signed by Vassie Loll MD on 11/07/2010 04:20:30 PM

## 2010-11-24 ENCOUNTER — Encounter (HOSPITAL_BASED_OUTPATIENT_CLINIC_OR_DEPARTMENT_OTHER): Payer: Medicare Other | Admitting: Oncology

## 2010-11-24 ENCOUNTER — Other Ambulatory Visit: Payer: Self-pay | Admitting: Oncology

## 2010-11-24 DIAGNOSIS — I10 Essential (primary) hypertension: Secondary | ICD-10-CM

## 2010-11-24 DIAGNOSIS — D72819 Decreased white blood cell count, unspecified: Secondary | ICD-10-CM

## 2010-11-24 DIAGNOSIS — C61 Malignant neoplasm of prostate: Secondary | ICD-10-CM

## 2010-11-24 DIAGNOSIS — K219 Gastro-esophageal reflux disease without esophagitis: Secondary | ICD-10-CM

## 2010-11-24 LAB — CBC WITH DIFFERENTIAL/PLATELET
Eosinophils Absolute: 0.1 10*3/uL (ref 0.0–0.5)
MONO#: 0.7 10*3/uL (ref 0.1–0.9)
NEUT#: 1.7 10*3/uL (ref 1.5–6.5)
Platelets: 167 10*3/uL (ref 140–400)
RBC: 3.42 10*6/uL — ABNORMAL LOW (ref 4.20–5.82)
RDW: 13.5 % (ref 11.0–14.6)
WBC: 3.9 10*3/uL — ABNORMAL LOW (ref 4.0–10.3)
lymph#: 1.5 10*3/uL (ref 0.9–3.3)

## 2011-03-18 ENCOUNTER — Encounter: Payer: Self-pay | Admitting: *Deleted

## 2011-03-18 ENCOUNTER — Encounter: Payer: Self-pay | Admitting: Oncology

## 2011-03-18 DIAGNOSIS — D72819 Decreased white blood cell count, unspecified: Secondary | ICD-10-CM | POA: Insufficient documentation

## 2011-03-18 DIAGNOSIS — N400 Enlarged prostate without lower urinary tract symptoms: Secondary | ICD-10-CM | POA: Insufficient documentation

## 2011-03-18 DIAGNOSIS — J45909 Unspecified asthma, uncomplicated: Secondary | ICD-10-CM | POA: Insufficient documentation

## 2011-03-18 DIAGNOSIS — K219 Gastro-esophageal reflux disease without esophagitis: Secondary | ICD-10-CM | POA: Insufficient documentation

## 2011-03-18 DIAGNOSIS — G8929 Other chronic pain: Secondary | ICD-10-CM | POA: Insufficient documentation

## 2011-03-23 ENCOUNTER — Other Ambulatory Visit: Payer: Self-pay | Admitting: Oncology

## 2011-03-23 ENCOUNTER — Ambulatory Visit (HOSPITAL_BASED_OUTPATIENT_CLINIC_OR_DEPARTMENT_OTHER): Payer: Medicare Other | Admitting: Oncology

## 2011-03-23 ENCOUNTER — Telehealth: Payer: Self-pay | Admitting: Oncology

## 2011-03-23 ENCOUNTER — Other Ambulatory Visit (HOSPITAL_BASED_OUTPATIENT_CLINIC_OR_DEPARTMENT_OTHER): Payer: Medicare Other | Admitting: Lab

## 2011-03-23 DIAGNOSIS — Z23 Encounter for immunization: Secondary | ICD-10-CM

## 2011-03-23 DIAGNOSIS — C61 Malignant neoplasm of prostate: Secondary | ICD-10-CM

## 2011-03-23 DIAGNOSIS — N400 Enlarged prostate without lower urinary tract symptoms: Secondary | ICD-10-CM

## 2011-03-23 DIAGNOSIS — K219 Gastro-esophageal reflux disease without esophagitis: Secondary | ICD-10-CM

## 2011-03-23 DIAGNOSIS — D649 Anemia, unspecified: Secondary | ICD-10-CM

## 2011-03-23 DIAGNOSIS — D72829 Elevated white blood cell count, unspecified: Secondary | ICD-10-CM

## 2011-03-23 DIAGNOSIS — D72819 Decreased white blood cell count, unspecified: Secondary | ICD-10-CM

## 2011-03-23 LAB — COMPREHENSIVE METABOLIC PANEL
Alkaline Phosphatase: 68 U/L (ref 39–117)
Creatinine, Ser: 1 mg/dL (ref 0.50–1.35)
Glucose, Bld: 86 mg/dL (ref 70–99)
Sodium: 130 mEq/L — ABNORMAL LOW (ref 135–145)
Total Bilirubin: 0.3 mg/dL (ref 0.3–1.2)
Total Protein: 7.1 g/dL (ref 6.0–8.3)

## 2011-03-23 LAB — CBC WITH DIFFERENTIAL/PLATELET
Eosinophils Absolute: 0.1 10*3/uL (ref 0.0–0.5)
HCT: 34.6 % — ABNORMAL LOW (ref 38.4–49.9)
LYMPH%: 33.3 % (ref 14.0–49.0)
MONO#: 0.5 10*3/uL (ref 0.1–0.9)
NEUT#: 1.6 10*3/uL (ref 1.5–6.5)
NEUT%: 48.3 % (ref 39.0–75.0)
Platelets: 188 10*3/uL (ref 140–400)
WBC: 3.4 10*3/uL — ABNORMAL LOW (ref 4.0–10.3)
lymph#: 1.1 10*3/uL (ref 0.9–3.3)

## 2011-03-23 LAB — MORPHOLOGY: PLT EST: ADEQUATE

## 2011-03-23 LAB — CHCC SMEAR

## 2011-03-23 MED ORDER — INFLUENZA VIRUS VACC SPLIT PF IM SUSP
0.5000 mL | INTRAMUSCULAR | Status: AC | PRN
  Administered 2011-03-23: 0.5 mL via INTRAMUSCULAR

## 2011-03-23 NOTE — Telephone Encounter (Signed)
gve the pt his April,july,dec 2013 appt calendar

## 2011-03-23 NOTE — Progress Notes (Signed)
[facility] OFFICE PROGRESS NOTE  DIAGNOSIS:  Leukocytosis, and normocytic anemia, NOS  PAST THERAPY: watchful observation  CURRENT THERAPY: watchful observation  INTERVAL HISTORY: Steven Beasley 70 y.o. male returns for regular follow up.  He reports that he feels well.  He works as a Special educational needs teacher.  He denies infections and bleeding symptoms.   Patient denies fatigue, headache, visual changes, confusion, drenching night sweats, palpable lymph node swelling, mucositis, odynophagia, dysphagia, nausea vomiting, jaundice, chest pain, palpitation, shortness of breath, dyspnea on exertion, productive cough, gum bleeding, epistaxis, hematemesis, hemoptysis, abdominal pain, abdominal swelling, early satiety, melena, hematochezia, hematuria, skin rash, spontaneous bleeding, joint swelling, joint pain, heat or cold intolerance, bowel bladder incontinence, back pain, focal motor weakness, paresthesia, depression, suicidal or homocidal ideation, feeling hopelessness.   MEDICAL HISTORY: Past Medical History  Diagnosis Date  . Hypertension   . Leukocytopenia   . Asthma   . Abdominal pain, chronic, generalized   . GERD (gastroesophageal reflux disease)   . BPH (benign prostatic hyperplasia)     SURGICAL HISTORY: No past surgical history on file.  MEDICATIONS: Current Outpatient Prescriptions  Medication Sig Dispense Refill  . amLODipine (NORVASC) 10 MG tablet Take 10 mg by mouth daily.        Marland Kitchen esomeprazole (NEXIUM) 40 MG capsule Take 40 mg by mouth daily before breakfast.        . gabapentin (NEURONTIN) 300 MG capsule Take 300 mg by mouth 3 (three) times daily.        . Lisinopril (ZESTRIL PO) Take 20 mg by mouth daily.       Marland Kitchen oxybutynin (DITROPAN) 5 MG tablet Take 5 mg by mouth daily.        . pentosan polysulfate (ELMIRON) 100 MG capsule Take 100 mg by mouth 3 (three) times daily before meals.       . Tamsulosin HCl (FLOMAX) 0.4 MG CAPS Take 0.4 mg by mouth daily.         . Tolterodine Tartrate (DETROL LA PO) Take by mouth.        . venlafaxine (EFFEXOR-XR) 150 MG 24 hr capsule Take 150 mg by mouth daily.         Current Facility-Administered Medications  Medication Dose Route Frequency Provider Last Rate Last Dose  . influenza  inactive virus vaccine (FLUZONE/FLUARIX) injection 0.5 mL  0.5 mL Intramuscular Prior to discharge Jethro Bolus, MD   0.5 mL at 03/23/11 1542    ALLERGIES:   has no known allergies.  REVIEW OF SYSTEMS:  The rest of the 14-point review of system was negative.   Filed Vitals:   03/23/11 1521  BP: 138/88  Pulse: 85  Temp: 97 F (36.1 C)   Wt Readings from Last 3 Encounters:  03/23/11 158 lb 6.4 oz (71.85 kg)  07/28/10 160 lb 3.2 oz (72.666 kg)  10/01/09 162 lb 2.1 oz (73.542 kg)   ECOG Performance status: 1  PHYSICAL EXAMINATION:  General:  well-nourished in no acute distress.  Eyes:  no scleral icterus.  ENT:  There were no oropharyngeal lesions.  Neck was without thyromegaly.  Lymphatics:  Negative cervical, supraclavicular or axillary adenopathy.  Respiratory: lungs were clear bilaterally without wheezing or crackles.  Cardiovascular:  Regular rate and rhythm, S1/S2, without murmur, rub or gallop.  There was no pedal edema.  GI:  abdomen was soft, flat, nontender, nondistended, without organomegaly.  Muscoloskeletal:  no spinal tenderness of palpation of vertebral spine.  Skin exam was without echymosis,  petichae.  Neuro exam was nonfocal.  Patient was able to get on and off exam table without assistance.  Gait was normal.  Patient was alerted and oriented.  Attention was good.   Language was appropriate.  Mood was normal without depression.  Speech was not pressured.  Thought content was not tangential.      LABORATORY/RADIOLOGY DATA:  Lab Results  Component Value Date   WBC 3.4* 03/23/2011   HGB 11.8* 03/23/2011   HCT 34.6* 03/23/2011   PLT 188 03/23/2011   GLUCOSE 86 03/23/2011   CHOL 122 04/05/2007   TRIG 73 04/05/2007    HDL 53 04/05/2007   LDLCALC 54 04/05/2007   ALT 12 03/23/2011   AST 22 03/23/2011   NA 130* 03/23/2011   K 4.8 03/23/2011   CL 93* 03/23/2011   CREATININE 1.00 03/23/2011   BUN 16 03/23/2011   CO2 32 03/23/2011   TSH 1.236 10/21/2010   PSA 1.92 08/20/2009   INR 0.91 10/21/2010    I personally reviewed the patient's peripheral blood smear today.  There was isocytosis.  There was no peripheral blast.  There was no schistocytosis, spherocytosis, target cell, rouleaux formation, tear drop cell.  There was no giant platelets or platelet clumps.      ASSESSMENT AND PLAN:   1.  History of prostate cancer, status post radiation therapy between December 2010 and February 2011.  He is NED and he follows with Dr Dayton Scrape and Dr Reche Dixon.  2.  History of hypertension:  He is on lisinopril and amlodipine per PCP.  3.  Benign prostatic hypertrophy:  He is on tamsulosin per PCP.  4.  Chronic leukocytopenia:  His leukocytopenia has been chronic ever since 2003.  Most likely this is benign.  He has had leukocytopenia for quite a while.  If in the future, he develops significantly worsened neutropenia, anemia, or thrombocytopenia, I may consider a bone marrow biopsy.  5.  Normocytic anemia:  I will send for anemia work up with Iron panel, SPEP (given his hyponatremia) to rule out myeloma.  He does not remember when his last colonoscopy was.  He denies family history of colon cancer.  If he has iron deficiency, I will refer him to GI.   6.  Follow up:  Lab only in 4 and 8 months.  I'll see him in one year.

## 2011-03-24 ENCOUNTER — Other Ambulatory Visit: Payer: Self-pay | Admitting: Oncology

## 2011-03-24 DIAGNOSIS — K219 Gastro-esophageal reflux disease without esophagitis: Secondary | ICD-10-CM

## 2011-03-24 DIAGNOSIS — D72819 Decreased white blood cell count, unspecified: Secondary | ICD-10-CM

## 2011-03-24 DIAGNOSIS — D649 Anemia, unspecified: Secondary | ICD-10-CM

## 2011-03-24 DIAGNOSIS — Z23 Encounter for immunization: Secondary | ICD-10-CM

## 2011-03-24 DIAGNOSIS — N400 Enlarged prostate without lower urinary tract symptoms: Secondary | ICD-10-CM

## 2011-03-24 NOTE — Progress Notes (Signed)
Addended by: Jethro Bolus T on: 03/24/2011 11:10 AM   Modules accepted: Orders

## 2011-03-27 LAB — IRON AND TIBC
%SAT: 31 % (ref 20–55)
Iron: 78 ug/dL (ref 42–165)
TIBC: 251 ug/dL (ref 215–435)
UIBC: 173 ug/dL (ref 125–400)

## 2011-03-27 LAB — PROTEIN ELECTROPHORESIS, SERUM
Albumin ELP: 60.6 % (ref 55.8–66.1)
Alpha-1-Globulin: 4.5 % (ref 2.9–4.9)
Alpha-2-Globulin: 10.8 % (ref 7.1–11.8)
Beta 2: 3.6 % (ref 3.2–6.5)
Beta Globulin: 4.5 % — ABNORMAL LOW (ref 4.7–7.2)

## 2011-03-27 LAB — KAPPA/LAMBDA LIGHT CHAINS: Kappa free light chain: 4.35 mg/dL — ABNORMAL HIGH (ref 0.33–1.94)

## 2011-04-01 ENCOUNTER — Telehealth: Payer: Self-pay | Admitting: *Deleted

## 2011-04-01 NOTE — Telephone Encounter (Signed)
Attempted to call pt again and phone rings w/ no answer.  Called sister's number and there was also no answer.

## 2011-04-01 NOTE — Telephone Encounter (Signed)
Called pt's home and his brother, Jabreel Chimento, answered.  Pt was not at home.  Informed him that Dr. Gaylyn Rong wants to go over some lab results w/ pt in office.  Asked if he can come at 12:30pm tomorrow.  Freddie states he will give pt message and will plan on pt coming at 12:30pm tomorrow.  Instructed him to call us if he can't make it at that time.  He verbalized understanding.

## 2011-04-01 NOTE — Telephone Encounter (Signed)
Attempting to call pt several times today to schedule time for pt to come in and discuss lab results with Dr. Gaylyn Rong in office.  No answer and no answering machine.  Will continue to try to reach pt.Marland Kitchen

## 2011-04-02 ENCOUNTER — Telehealth: Payer: Self-pay | Admitting: Oncology

## 2011-04-02 ENCOUNTER — Ambulatory Visit (HOSPITAL_BASED_OUTPATIENT_CLINIC_OR_DEPARTMENT_OTHER): Payer: Medicare Other

## 2011-04-02 ENCOUNTER — Other Ambulatory Visit: Payer: Self-pay | Admitting: Oncology

## 2011-04-02 DIAGNOSIS — D649 Anemia, unspecified: Secondary | ICD-10-CM

## 2011-04-02 DIAGNOSIS — D72829 Elevated white blood cell count, unspecified: Secondary | ICD-10-CM

## 2011-04-02 LAB — CBC WITH DIFFERENTIAL/PLATELET
Eosinophils Absolute: 0.1 10*3/uL (ref 0.0–0.5)
LYMPH%: 34.5 % (ref 14.0–49.0)
MCHC: 33.4 g/dL (ref 32.0–36.0)
MCV: 96.6 fL (ref 79.3–98.0)
MONO%: 13 % (ref 0.0–14.0)
NEUT#: 1.9 10*3/uL (ref 1.5–6.5)
Platelets: 221 10*3/uL (ref 140–400)
RBC: 3.66 10*6/uL — ABNORMAL LOW (ref 4.20–5.82)

## 2011-04-02 NOTE — Telephone Encounter (Signed)
gve the pt his jan 2013 appt calendar along with the bone survey appt

## 2011-04-07 ENCOUNTER — Other Ambulatory Visit (HOSPITAL_COMMUNITY)
Admission: RE | Admit: 2011-04-07 | Discharge: 2011-04-07 | Disposition: A | Discharge status: Home / Self Care | Payer: Medicare Other | Source: Ambulatory Visit | Attending: Oncology | Admitting: Oncology

## 2011-04-07 ENCOUNTER — Other Ambulatory Visit: Payer: Self-pay | Admitting: Oncology

## 2011-04-07 ENCOUNTER — Ambulatory Visit (HOSPITAL_COMMUNITY)
Admission: RE | Admit: 2011-04-07 | Discharge: 2011-04-07 | Disposition: A | Discharge status: Home / Self Care | Payer: Medicare Other | Source: Ambulatory Visit | Attending: Oncology | Admitting: Oncology

## 2011-04-07 ENCOUNTER — Ambulatory Visit: Payer: Medicare Other

## 2011-04-07 ENCOUNTER — Ambulatory Visit (HOSPITAL_BASED_OUTPATIENT_CLINIC_OR_DEPARTMENT_OTHER): Payer: Medicare Other | Admitting: Oncology

## 2011-04-07 DIAGNOSIS — D649 Anemia, unspecified: Secondary | ICD-10-CM

## 2011-04-07 DIAGNOSIS — M47812 Spondylosis without myelopathy or radiculopathy, cervical region: Secondary | ICD-10-CM | POA: Insufficient documentation

## 2011-04-07 DIAGNOSIS — D472 Monoclonal gammopathy: Secondary | ICD-10-CM | POA: Insufficient documentation

## 2011-04-07 NOTE — Patient Instructions (Signed)
Patient dressing to right hip dry and intact,  instrucrted to leave dressing on until tomorrow and remove it in the shower

## 2011-04-07 NOTE — Progress Notes (Signed)
Bone marrow biopsy and aspiration.  INDICATION:  Anemia, positive SPEP; rule out plasma cell dyscrasia/myeloma.  Procedure: After obtained from consent, Steven Beasley was placed in the prone position. Time out was performed verifying correct patient and procedure. The skin overlying the left posterior crest was prepped with Betadine and draped in the usual sterile fashion. The skin and periosteum were infiltrated with 10 mL of 2% lidocaine. A small puncture wound was made with #11 scalpel blade.  Bone marrow aspirate was obtained on the first pass of the aspiration needle.  One separate core biopsy was obtained through the same incision.   The aspirate was sent for routine histology, flow cytometry, and cytogenetics and also FISH panel for myeloma if positive for myeloma.  Core biopsy was sent for routine histology.   Steven Beasley tolerated procedure well with minimal  blood loss and without immediate complication.   A sterile dressing was applied.   Jethro Bolus M.D. 04/07/2011

## 2011-05-07 ENCOUNTER — Ambulatory Visit (HOSPITAL_BASED_OUTPATIENT_CLINIC_OR_DEPARTMENT_OTHER): Payer: Medicare HMO | Admitting: Oncology

## 2011-05-07 ENCOUNTER — Telehealth: Payer: Self-pay | Admitting: Oncology

## 2011-05-07 VITALS — BP 150/88 | HR 97 | Temp 98.7°F | Ht 70.0 in | Wt 158.6 lb

## 2011-05-07 DIAGNOSIS — D649 Anemia, unspecified: Secondary | ICD-10-CM

## 2011-05-07 DIAGNOSIS — D72819 Decreased white blood cell count, unspecified: Secondary | ICD-10-CM

## 2011-05-07 DIAGNOSIS — C9000 Multiple myeloma not having achieved remission: Secondary | ICD-10-CM

## 2011-05-07 DIAGNOSIS — Z8546 Personal history of malignant neoplasm of prostate: Secondary | ICD-10-CM

## 2011-05-07 NOTE — Telephone Encounter (Signed)
gv pt appt schedule for march °

## 2011-05-08 ENCOUNTER — Encounter: Payer: Self-pay | Admitting: Oncology

## 2011-05-08 NOTE — Progress Notes (Signed)
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  Presenting sign was mild anemia with Hgb of 11.8.  Work up showed M-spike of 0.88.  BM biopsy showed 8% plasma cell.  Skeletal survey showed no lytic lesion.   CURRENT THERAPY: watchful observation  INTERVAL HISTORY: Steven Beasley 71 y.o. male returns to clinic with his 2 sisters to discuss result of recent bone marrow biopsy.  He reports that he feels well.  He still works as a Special educational needs teacher.  He denies fatigue, focal bone pain, SOB, CP, bleeding symptoms, recurrent infection, lower back pain, bowel/bladder incontinence, neuropathy.   Patient denies fatigue, headache, visual changes, confusion, drenching night sweats, palpable lymph node swelling, mucositis, odynophagia, dysphagia, nausea vomiting, jaundice, chest pain, palpitation, shortness of breath, dyspnea on exertion, productive cough, gum bleeding, epistaxis, hematemesis, hemoptysis, abdominal pain, abdominal swelling, early satiety, melena, hematochezia, hematuria, skin rash, spontaneous bleeding, joint swelling, joint pain, heat or cold intolerance, bowel bladder incontinence, back pain, focal motor weakness, paresthesia, depression, suicidal or homocidal ideation, feeling hopelessness.   MEDICAL HISTORY: Past Medical History  Diagnosis Date  . Hypertension   . Leukocytopenia   . Asthma   . Abdominal pain, chronic, generalized   . GERD (gastroesophageal reflux disease)   . BPH (benign prostatic hyperplasia)     SURGICAL HISTORY: No past surgical history on file.  MEDICATIONS: Current Outpatient Prescriptions  Medication Sig Dispense Refill  . amLODipine (NORVASC) 10 MG tablet Take 10 mg by mouth daily.        Marland Kitchen esomeprazole (NEXIUM) 40 MG capsule Take 40 mg by mouth daily before breakfast.        . gabapentin (NEURONTIN) 300 MG capsule Take 300 mg by mouth 3 (three) times daily.        . Lisinopril (ZESTRIL  PO) Take 20 mg by mouth daily.       Marland Kitchen oxybutynin (DITROPAN) 5 MG tablet Take 5 mg by mouth daily.        . pentosan polysulfate (ELMIRON) 100 MG capsule Take 100 mg by mouth 3 (three) times daily before meals.       . Tamsulosin HCl (FLOMAX) 0.4 MG CAPS Take 0.4 mg by mouth daily.       . Tolterodine Tartrate (DETROL LA PO) Take by mouth.        . venlafaxine (EFFEXOR-XR) 150 MG 24 hr capsule Take 150 mg by mouth daily.          ALLERGIES:   has no known allergies.  REVIEW OF SYSTEMS:  The rest of the 14-point review of system was negative.   Filed Vitals:   05/07/11 1152  BP: 150/88  Pulse: 97  Temp: 98.7 F (37.1 C)   Wt Readings from Last 3 Encounters:  05/07/11 158 lb 9.6 oz (71.94 kg)  03/23/11 158 lb 6.4 oz (71.85 kg)  07/28/10 160 lb 3.2 oz (72.666 kg)   ECOG Performance status: 0  PHYSICAL EXAMINATION:  General:  well-nourished man in no acute distress.  Eyes:  no scleral icterus.  ENT:  There were no oropharyngeal lesions.  Neck was without thyromegaly.  Lymphatics:  Negative cervical, supraclavicular or axillary adenopathy.  Respiratory: lungs were clear bilaterally without wheezing or crackles.  Cardiovascular:  Regular rate and rhythm, S1/S2, without murmur, rub or gallop.  There was no pedal edema.  GI:  abdomen was soft, flat, nontender, nondistended, without organomegaly.  Muscoloskeletal:  no spinal tenderness of palpation of  vertebral spine.  Skin exam was without echymosis, petichae.  Neuro exam was nonfocal.  Patient was able to get on and off exam table without assistance.  Gait was normal.  Patient was alerted and oriented.  Attention was good.   Language was appropriate.  Mood was normal without depression.  Speech was not pressured.  Thought content was not tangential.      LABORATORY/RADIOLOGY DATA:  Lab Results  Component Value Date   WBC 3.9* 04/02/2011   HGB 11.8* 04/02/2011   HCT 35.4* 04/02/2011   PLT 221 04/02/2011   GLUCOSE 86 03/23/2011   CHOL  122 04/05/2007   TRIG 73 04/05/2007   HDL 53 04/05/2007   LDLCALC 54 04/05/2007   ALT 12 03/23/2011   AST 22 03/23/2011   NA 130* 03/23/2011   K 4.8 03/23/2011   CL 93* 03/23/2011   CREATININE 1.00 03/23/2011   BUN 16 03/23/2011   CO2 32 03/23/2011   TSH 1.236 10/21/2010   PSA 1.92 08/20/2009   INR 0.91 10/21/2010    I personally reviewed the patient's peripheral blood smear today.  There was isocytosis.  There was no peripheral blast.  There was no schistocytosis, spherocytosis, target cell, rouleaux formation, tear drop cell.  There was no giant platelets or platelet clumps.      ASSESSMENT AND PLAN:   1.  History of prostate cancer, status post radiation therapy between December 2010 and February 2011.  He is NED and he follows with Dr Dayton Scrape and Dr Reche Dixon.  2.  History of hypertension:  He is on lisinopril and amlodipine per PCP.  3.  Benign prostatic hypertrophy:  He is on tamsulosin per PCP.  4.  Chronic leukocytopenia:  His leukocytopenia has been chronic ever since 2003.  Most likely this is benign.  He has had leukocytopenia for quite a while.  If in the future, he develops significantly worsened neutropenia, anemia, or thrombocytopenia, I may consider a bone marrow biopsy.  5.  Smoldering myeloma:  I had an extensive discussion with patient and his sisters.  He only has mild anemia with Hgb 11.i;  0.8gm/dL of M-spike; 8% plasma cell on bone marrow aspirate.  He has not renal insufficiency, hypercalcemia, lytic bone lesion.  Thus, he is best classified as smoldering myeloma.  One option is to observe closely with follow lab in 2 months.  If his anemia significantly worsens or he develops renal insufficiency or hypercalcemia, then we can start therapy then.  An aggressive option is to start chemotherapy with Revelimid/Velcade/Dexamethasone followed by evaluation for autologous BMT.  This chemo regimen has side effects which include but not limited to fatigue, nausea/vomiting, neuropathy,  cytopenia, bleeding, infection, thrombosis.    At this time, Steven Beasley and his sisters lean toward close watchful observation with follow up in 2 months.  I advised him to contact clinic if he changes his mind and would like to start chemo instead.   6.  Follow up: Lab/RV with me in about 2 months.    The length of time of the face-to-face encounter was 25 minutes. More than 50% of time was spent counseling and coordination of care.

## 2011-07-06 ENCOUNTER — Ambulatory Visit (HOSPITAL_BASED_OUTPATIENT_CLINIC_OR_DEPARTMENT_OTHER): Payer: Medicare Other | Admitting: Lab

## 2011-07-06 DIAGNOSIS — C9000 Multiple myeloma not having achieved remission: Secondary | ICD-10-CM

## 2011-07-06 LAB — CBC WITH DIFFERENTIAL/PLATELET
Basophils Absolute: 0 10*3/uL (ref 0.0–0.1)
Eosinophils Absolute: 0 10*3/uL (ref 0.0–0.5)
LYMPH%: 19.9 % (ref 14.0–49.0)
MCV: 97.3 fL (ref 79.3–98.0)
MONO%: 15.9 % — ABNORMAL HIGH (ref 0.0–14.0)
NEUT#: 3.2 10*3/uL (ref 1.5–6.5)
NEUT%: 62.9 % (ref 39.0–75.0)
Platelets: 201 10*3/uL (ref 140–400)
RBC: 3.89 10*6/uL — ABNORMAL LOW (ref 4.20–5.82)

## 2011-07-08 LAB — COMPREHENSIVE METABOLIC PANEL
Alkaline Phosphatase: 71 U/L (ref 39–117)
BUN: 16 mg/dL (ref 6–23)
Creatinine, Ser: 1.36 mg/dL — ABNORMAL HIGH (ref 0.50–1.35)
Glucose, Bld: 90 mg/dL (ref 70–99)
Sodium: 134 mEq/L — ABNORMAL LOW (ref 135–145)
Total Bilirubin: 0.5 mg/dL (ref 0.3–1.2)

## 2011-07-08 LAB — KAPPA/LAMBDA LIGHT CHAINS: Kappa free light chain: 5.29 mg/dL — ABNORMAL HIGH (ref 0.33–1.94)

## 2011-07-09 ENCOUNTER — Other Ambulatory Visit: Payer: Self-pay | Admitting: Lab

## 2011-07-09 DIAGNOSIS — C9000 Multiple myeloma not having achieved remission: Secondary | ICD-10-CM

## 2011-07-09 LAB — IMMUNOFIXATION ELECTROPHORESIS

## 2011-07-13 ENCOUNTER — Ambulatory Visit (HOSPITAL_BASED_OUTPATIENT_CLINIC_OR_DEPARTMENT_OTHER): Payer: Medicare Other | Admitting: Oncology

## 2011-07-13 ENCOUNTER — Telehealth: Payer: Self-pay | Admitting: Oncology

## 2011-07-13 VITALS — BP 132/69 | HR 91 | Temp 97.1°F | Ht 70.0 in | Wt 156.6 lb

## 2011-07-13 DIAGNOSIS — C9000 Multiple myeloma not having achieved remission: Secondary | ICD-10-CM

## 2011-07-13 NOTE — Telephone Encounter (Signed)
appts made and printed for pt aom °

## 2011-07-13 NOTE — Progress Notes (Signed)
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  Presenting sign was mild anemia with Hgb of 11.8.  Work up showed M-spike of 0.88.  BM biopsy showed 8% plasma cell.  Skeletal survey showed no lytic lesion.   CURRENT THERAPY: watchful observation  INTERVAL HISTORY: ESA RADEN 71 y.o. male returns to clinic by himself.  He has intermittent bilateral inferior iliac bone pain when he sits for a long time.  Pain is mild.  He does not need pain med.  He does not have problem with bowel/bladder incontinence, paresthesia, leg weakness.  He denies bone pain anywhere else.  He still works at [employer] without fatigue, SOB, CP, abd pain, bleeding symptoms, recurrent infection.    MEDICAL HISTORY: Past Medical History  Diagnosis Date  . Hypertension   . Leukocytopenia   . Asthma   . Abdominal pain, chronic, generalized   . GERD (gastroesophageal reflux disease)   . BPH (benign prostatic hyperplasia)     SURGICAL HISTORY: No past surgical history on file.  MEDICATIONS: Current Outpatient Prescriptions  Medication Sig Dispense Refill  . amLODipine (NORVASC) 10 MG tablet Take 10 mg by mouth daily.        Marland Kitchen esomeprazole (NEXIUM) 40 MG capsule Take 40 mg by mouth daily before breakfast.        . gabapentin (NEURONTIN) 300 MG capsule Take 300 mg by mouth 3 (three) times daily.        . Lisinopril (ZESTRIL PO) Take 20 mg by mouth daily.       Marland Kitchen oxybutynin (DITROPAN) 5 MG tablet Take 5 mg by mouth daily.        . pentosan polysulfate (ELMIRON) 100 MG capsule Take 100 mg by mouth 3 (three) times daily before meals.       . Tamsulosin HCl (FLOMAX) 0.4 MG CAPS Take 0.4 mg by mouth daily.       . Tolterodine Tartrate (DETROL LA PO) Take by mouth.        . venlafaxine (EFFEXOR-XR) 150 MG 24 hr capsule Take 150 mg by mouth daily.          ALLERGIES:   has no known allergies.  REVIEW OF SYSTEMS:  The rest of the 14-point  review of system was negative.   Filed Vitals:   07/13/11 1136  BP: 132/69  Pulse: 91  Temp: 97.1 F (36.2 C)   Wt Readings from Last 3 Encounters:  07/13/11 156 lb 9.6 oz (71.033 kg)  05/07/11 158 lb 9.6 oz (71.94 kg)  03/23/11 158 lb 6.4 oz (71.85 kg)   ECOG Performance status: 0  PHYSICAL EXAMINATION:  General:  well-nourished man in no acute distress.  Eyes:  no scleral icterus.  ENT:  There were no oropharyngeal lesions.  Neck was without thyromegaly.  Lymphatics:  Negative cervical, supraclavicular or axillary adenopathy.  Respiratory: lungs were clear bilaterally without wheezing or crackles.  Cardiovascular:  Regular rate and rhythm, S1/S2, without murmur, rub or gallop.  There was no pedal edema.  GI:  abdomen was soft, flat, nontender, nondistended, without organomegaly.  Muscoloskeletal:  no spinal tenderness of palpation of vertebral spine.  There was no pain in bilateral hip or iliac with passive movement of his legs.  Skin exam was without echymosis, petichae.  Neuro exam was nonfocal.  Patient was able to get on and off exam table without assistance.  Gait was normal.  Patient was alerted and oriented.  Attention  was good.   Language was appropriate.  Mood was normal without depression.  Speech was not pressured.  Thought content was not tangential.      LABORATORY/RADIOLOGY DATA:  Lab Results  Component Value Date   WBC 5.0 07/06/2011   HGB 12.6* 07/06/2011   HCT 37.8* 07/06/2011   PLT 201 07/06/2011   GLUCOSE 90 07/06/2011   CHOL 122 04/05/2007   TRIG 73 04/05/2007   HDL 53 04/05/2007   LDLCALC 54 04/05/2007   ALT 14 07/06/2011   AST 24 07/06/2011   NA 134* 07/06/2011   K 3.8 07/06/2011   CL 91* 07/06/2011   CREATININE 1.36* 07/06/2011   BUN 16 07/06/2011   CO2 35* 07/06/2011   TSH 1.236 10/21/2010   PSA 1.92 08/20/2009   INR 0.91 10/21/2010    ASSESSMENT AND PLAN:   1.  History of prostate cancer, status post radiation therapy between December 2010 and February 2011.   He is NED and he follows with Dr Dayton Scrape and Dr Reche Dixon.  2.  History of hypertension:  He is on lisinopril and amlodipine per PCP with good control.  3.  Benign prostatic hypertrophy:  He is on tamsulosin per PCP.  4.  Chronic leukocytopenia:  Resolved.   5.  Smoldering myeloma:  He has stable serum light chain.  His Hgb has improved on just observation compared to before.  His Cr is slightly worsened now compared to last year.  I have M-spike pending.  However, beside slightly worsened Cr, there is no evidence of worsening anemia, hypercalcemia, focal bone pain to suggest progression to active myeloma.  I recommended very close observation with BMET check every 2 wks x 3 months.  If his Cr worsens without obvious explanation of dehydration, then I may consider starting therapy for active myeloma with Revlimid/Dex.  Mr. Montilla felt comfortable with this watchful waiting approach.   6.  Follow up: Lab only every 2 wks.  Lab/RV with me in about 3 months.    The length of time of the face-to-face encounter was 15 minutes. More than 50% of time was spent counseling and coordination of care.

## 2011-07-14 LAB — PROTEIN ELECTROPHORESIS, SERUM
Alpha-2-Globulin: 10.8 % (ref 7.1–11.8)
Beta 2: 3.8 % (ref 3.2–6.5)
Beta Globulin: 4.7 % (ref 4.7–7.2)
M-Spike, %: 0.88 g/dL

## 2011-07-21 ENCOUNTER — Other Ambulatory Visit: Payer: Medicare Other

## 2011-07-27 ENCOUNTER — Telehealth: Payer: Self-pay | Admitting: *Deleted

## 2011-07-27 ENCOUNTER — Other Ambulatory Visit (HOSPITAL_BASED_OUTPATIENT_CLINIC_OR_DEPARTMENT_OTHER): Payer: Medicare Other | Admitting: Lab

## 2011-07-27 DIAGNOSIS — D72819 Decreased white blood cell count, unspecified: Secondary | ICD-10-CM

## 2011-07-27 DIAGNOSIS — Z23 Encounter for immunization: Secondary | ICD-10-CM

## 2011-07-27 DIAGNOSIS — K219 Gastro-esophageal reflux disease without esophagitis: Secondary | ICD-10-CM

## 2011-07-27 DIAGNOSIS — N400 Enlarged prostate without lower urinary tract symptoms: Secondary | ICD-10-CM

## 2011-07-27 DIAGNOSIS — D649 Anemia, unspecified: Secondary | ICD-10-CM

## 2011-07-27 DIAGNOSIS — C9000 Multiple myeloma not having achieved remission: Secondary | ICD-10-CM

## 2011-07-27 LAB — CBC WITH DIFFERENTIAL/PLATELET
Basophils Absolute: 0 10*3/uL (ref 0.0–0.1)
Eosinophils Absolute: 0.1 10*3/uL (ref 0.0–0.5)
HGB: 12.2 g/dL — ABNORMAL LOW (ref 13.0–17.1)
MCV: 96.8 fL (ref 79.3–98.0)
MONO%: 18.2 % — ABNORMAL HIGH (ref 0.0–14.0)
NEUT#: 1.5 10*3/uL (ref 1.5–6.5)
RBC: 3.74 10*6/uL — ABNORMAL LOW (ref 4.20–5.82)
RDW: 12.6 % (ref 11.0–14.6)
WBC: 3 10*3/uL — ABNORMAL LOW (ref 4.0–10.3)
lymph#: 0.9 10*3/uL (ref 0.9–3.3)
nRBC: 0 % (ref 0–0)

## 2011-07-27 LAB — BASIC METABOLIC PANEL
BUN: 16 mg/dL (ref 6–23)
Calcium: 10 mg/dL (ref 8.4–10.5)
Chloride: 90 mEq/L — ABNORMAL LOW (ref 96–112)
Creatinine, Ser: 1.13 mg/dL (ref 0.50–1.35)

## 2011-07-27 NOTE — Telephone Encounter (Signed)
Called pt and informed of labs stable today per Dr. Gaylyn Rong keep next appt as scheduled in 2 weeks.  He verbalized understanding.

## 2011-07-27 NOTE — Telephone Encounter (Signed)
Message copied by Wende Mott on Mon Jul 27, 2011  3:07 PM ------      Message from: HA, Raliegh Ip T      Created: Mon Jul 27, 2011  2:46 PM       Please call pt.  He still has smoldering myeloma (not active, nor fast progressing myeloma).  His anemia and kidney function are still stable.  Continue observation for now.  Continue to keep previously arranged appointments.  Thanks.

## 2011-08-10 ENCOUNTER — Other Ambulatory Visit (HOSPITAL_BASED_OUTPATIENT_CLINIC_OR_DEPARTMENT_OTHER): Payer: Medicare Other

## 2011-08-10 DIAGNOSIS — C9000 Multiple myeloma not having achieved remission: Secondary | ICD-10-CM

## 2011-08-10 LAB — BASIC METABOLIC PANEL
CO2: 32 mEq/L (ref 19–32)
Calcium: 9.4 mg/dL (ref 8.4–10.5)
Potassium: 4.2 mEq/L (ref 3.5–5.3)
Sodium: 136 mEq/L (ref 135–145)

## 2011-08-12 ENCOUNTER — Telehealth: Payer: Self-pay | Admitting: *Deleted

## 2011-08-12 NOTE — Telephone Encounter (Signed)
Message copied by Wende Mott on Wed Aug 12, 2011  3:20 PM ------      Message from: HA, Raliegh Ip T      Created: Mon Aug 10, 2011  5:24 PM       Please call patient.  His kidney function is still normal.  Again, he has smoldering myeloma (not active yet).  I again recommend watchful observation.  Thanks.

## 2011-08-12 NOTE — Telephone Encounter (Signed)
Called pt and informed his kidney function still normal per Dr. Gaylyn Rong.  Informed pt he continues to have smoldering myeloma,  Not active and instructed to keep next lab as scheduled in May.  He verbalized understanding.

## 2011-08-24 ENCOUNTER — Other Ambulatory Visit (HOSPITAL_BASED_OUTPATIENT_CLINIC_OR_DEPARTMENT_OTHER): Payer: Medicare Other

## 2011-08-24 DIAGNOSIS — C9000 Multiple myeloma not having achieved remission: Secondary | ICD-10-CM

## 2011-08-24 DIAGNOSIS — D649 Anemia, unspecified: Secondary | ICD-10-CM

## 2011-08-24 LAB — BASIC METABOLIC PANEL WITH GFR
BUN: 14 mg/dL (ref 6–23)
CO2: 37 meq/L — ABNORMAL HIGH (ref 19–32)
Calcium: 9.8 mg/dL (ref 8.4–10.5)
Chloride: 100 meq/L (ref 96–112)
Creatinine, Ser: 1.04 mg/dL (ref 0.50–1.35)
Glucose, Bld: 94 mg/dL (ref 70–99)
Potassium: 4.5 meq/L (ref 3.5–5.3)
Sodium: 141 meq/L (ref 135–145)

## 2011-08-25 ENCOUNTER — Telehealth: Payer: Self-pay

## 2011-08-25 NOTE — Telephone Encounter (Signed)
Called patient to inform that myeloma is not active and we will continue to monitor labs; verbalized understanding.

## 2011-09-07 ENCOUNTER — Other Ambulatory Visit (HOSPITAL_BASED_OUTPATIENT_CLINIC_OR_DEPARTMENT_OTHER): Payer: Medicare Other | Admitting: Lab

## 2011-09-07 DIAGNOSIS — C9000 Multiple myeloma not having achieved remission: Secondary | ICD-10-CM

## 2011-09-07 LAB — BASIC METABOLIC PANEL
BUN: 15 mg/dL (ref 6–23)
CO2: 33 mEq/L — ABNORMAL HIGH (ref 19–32)
Calcium: 10.2 mg/dL (ref 8.4–10.5)
Chloride: 93 mEq/L — ABNORMAL LOW (ref 96–112)
Creatinine, Ser: 0.99 mg/dL (ref 0.50–1.35)

## 2011-09-08 ENCOUNTER — Telehealth: Payer: Self-pay | Admitting: Oncology

## 2011-09-08 NOTE — Telephone Encounter (Signed)
Message copied by Myrtis Ser on Tue Sep 08, 2011  9:31 AM ------      Message from: HA, Raliegh Ip T      Created: Mon Sep 07, 2011  7:08 PM       Please call patient.  His smoldering myeloma is stable. No indication for chemo yet.   Continue observation. Thanks.

## 2011-09-08 NOTE — Telephone Encounter (Signed)
Patient called with labs. His smoldering myeloma is stable. No indication for chemo yet. Continue observation.

## 2011-09-09 ENCOUNTER — Emergency Department (HOSPITAL_COMMUNITY)
Admission: EM | Admit: 2011-09-09 | Discharge: 2011-09-09 | Disposition: A | Discharge status: Home / Self Care | Payer: Medicare Other | Attending: Emergency Medicine | Admitting: Emergency Medicine

## 2011-09-09 ENCOUNTER — Encounter (HOSPITAL_COMMUNITY): Payer: Self-pay | Admitting: *Deleted

## 2011-09-09 DIAGNOSIS — J45909 Unspecified asthma, uncomplicated: Secondary | ICD-10-CM | POA: Insufficient documentation

## 2011-09-09 DIAGNOSIS — I4510 Unspecified right bundle-branch block: Secondary | ICD-10-CM | POA: Insufficient documentation

## 2011-09-09 DIAGNOSIS — I491 Atrial premature depolarization: Secondary | ICD-10-CM

## 2011-09-09 DIAGNOSIS — R000 Tachycardia, unspecified: Secondary | ICD-10-CM | POA: Insufficient documentation

## 2011-09-09 DIAGNOSIS — E875 Hyperkalemia: Secondary | ICD-10-CM | POA: Insufficient documentation

## 2011-09-09 DIAGNOSIS — I10 Essential (primary) hypertension: Secondary | ICD-10-CM | POA: Insufficient documentation

## 2011-09-09 DIAGNOSIS — K219 Gastro-esophageal reflux disease without esophagitis: Secondary | ICD-10-CM | POA: Insufficient documentation

## 2011-09-09 LAB — COMPREHENSIVE METABOLIC PANEL
ALT: 14 U/L (ref 0–53)
AST: 20 U/L (ref 0–37)
Alkaline Phosphatase: 69 U/L (ref 39–117)
CO2: 32 mEq/L (ref 19–32)
GFR calc Af Amer: >90 mL/min (ref >90)
Glucose, Bld: 124 mg/dL — ABNORMAL HIGH (ref 70–99)
Potassium: 5.2 mEq/L — ABNORMAL HIGH (ref 3.5–5.1)
Sodium: 127 mEq/L — ABNORMAL LOW (ref 135–145)
Total Protein: 7.4 g/dL (ref 6.0–8.3)

## 2011-09-09 LAB — DIFFERENTIAL
Basophils Absolute: 0 10*3/uL (ref 0.0–0.1)
Eosinophils Absolute: 0.1 10*3/uL (ref 0.0–0.7)
Lymphocytes Relative: 39 % (ref 12–46)
Lymphs Abs: 1.7 10*3/uL (ref 0.7–4.0)
Neutrophils Relative %: 43 % (ref 43–77)

## 2011-09-09 LAB — CBC
Platelets: 248 10*3/uL (ref 150–400)
RBC: 3.94 MIL/uL — ABNORMAL LOW (ref 4.22–5.81)
RDW: 12.2 % (ref 11.5–15.5)
WBC: 4.3 10*3/uL (ref 4.0–10.5)

## 2011-09-09 NOTE — ED Provider Notes (Signed)
History     CSN: [id number]  Arrival date & time 09/09/11  1518   First MD Initiated Contact with Patient 09/09/11 1630      Chief Complaint  Patient presents with  . Atrial Fibrillation    (Consider location/radiation/quality/duration/timing/severity/associated sxs/prior treatment) HPI Comments: Steven Beasley is a 71 y.o. Male who was at his urology appointment. Today, when he was suddenly discovered to have a heart rate of 160 and an irregularly irregular rhythm. Documentation, with him, does not include an EKG. There are several heart rates documented between 2:39 and 2:54 PM, that show bradycardia. The patient was not dizzy or weak today. He denies recent nausea, vomiting, fever, chills, back pain, shortness of breath, or chest pain.  Patient is a 71 y.o. male presenting with atrial fibrillation. The history is provided by the patient.  Atrial Fibrillation    Past Medical History  Diagnosis Date  . Hypertension   . Leukocytopenia   . Asthma   . Abdominal pain, chronic, generalized   . GERD (gastroesophageal reflux disease)   . BPH (benign prostatic hyperplasia)     History reviewed. No pertinent past surgical history.  No family history on file.  History  Substance Use Topics  . Smoking status: Not on file  . Smokeless tobacco: Not on file  . Alcohol Use: Not on file      Review of Systems  All other systems reviewed and are negative.    Allergies  Review of patient's allergies indicates no known allergies.  Home Medications   Current Outpatient Rx  Name Route Sig Dispense Refill  . AMLODIPINE BESYLATE 10 MG PO TABS Oral Take 10 mg by mouth daily.      Marland Kitchen ESOMEPRAZOLE MAGNESIUM 40 MG PO CPDR Oral Take 40 mg by mouth daily before breakfast.      . FLUTICASONE PROPIONATE 50 MCG/ACT NA SUSP Nasal Place 1 spray into the nose 2 (two) times daily.    Marland Kitchen GABAPENTIN 300 MG PO CAPS Oral Take 300 mg by mouth 3 (three) times daily.      Marland Kitchen ZESTRIL PO Oral Take  20 mg by mouth daily.     Marland Kitchen PENTOSAN POLYSULFATE SODIUM 100 MG PO CAPS Oral Take 100 mg by mouth 3 (three) times daily before meals.     . TAMSULOSIN HCL 0.4 MG PO CAPS Oral Take 0.4 mg by mouth daily.     Marland Kitchen DETROL LA PO Oral Take 4 mg by mouth daily.     . VENLAFAXINE HCL ER 150 MG PO CP24 Oral Take 150 mg by mouth daily.      Marland Kitchen PROAIR HFA 108 (90 BASE) MCG/ACT IN AERS Inhalation Inhale 1-2 puffs into the lungs as needed. For asthma.      BP 139/85  Pulse 83  Temp(Src) 98.5 F (36.9 C) (Oral)  Resp 13  SpO2 98%  Physical Exam  Nursing note and vitals reviewed. Constitutional: He is oriented to person, place, and time. He appears well-developed and well-nourished.  HENT:  Head: Normocephalic and atraumatic.  Right Ear: External ear normal.  Left Ear: External ear normal.  Eyes: Conjunctivae and EOM are normal. Pupils are equal, round, and reactive to light.  Neck: Normal range of motion and phonation normal. Neck supple.  Cardiovascular: Normal rate, regular rhythm, normal heart sounds and intact distal pulses.   Pulmonary/Chest: Effort normal and breath sounds normal. He exhibits no bony tenderness.  Abdominal: Soft. Normal appearance. There is no tenderness.  Musculoskeletal: Normal  range of motion.  Neurological: He is alert and oriented to person, place, and time. He has normal strength. No cranial nerve deficit or sensory deficit. He exhibits normal muscle tone. Coordination normal.  Skin: Skin is warm, dry and intact.  Psychiatric: He has a normal mood and affect. His behavior is normal. Judgment and thought content normal.    ED Course  Procedures (including critical care time)   Date: 09/09/2011  Rate:   Rhythm: normal sinus rhythm with PAC  QRS Axis: normal  Intervals: normal  ST/T Wave abnormalities: normal  Conduction Disutrbances:Incomplete RBBB  Narrative Interpretation:   Old EKG Reviewed: unchanged  Ambulation trial on monitor: No new  arrhythmias,  tachycardia and no symptoms.    Labs Reviewed  CBC - Abnormal; Notable for the following:    RBC 3.94 (*)    Hemoglobin 12.5 (*)    HCT 35.9 (*)    All other components within normal limits  DIFFERENTIAL - Abnormal; Notable for the following:    Monocytes Relative 14 (*)    All other components within normal limits  COMPREHENSIVE METABOLIC PANEL - Abnormal; Notable for the following:    Sodium 127 (*)    Potassium 5.2 (*)    Chloride 90 (*)    Glucose, Bld 124 (*)    GFR calc non Af Amer 82 (*)    All other components within normal limits  POTASSIUM   No results found.   1. Premature atrial contraction   2. Hyperkalemia       MDM  Reported tachycardia, with documented, PAC. Possible transient atrial fibrillation. Borderline elevated potassium is likely secondary to lisinopril. Blood pressure is well-controlled. Patient is asymptomatic. Doubt ACS, metabolic instability or serious bacterial infection.   Plan: Home Medications- Stop Lisinopril; Home Treatments- rest; Recommended follow up- PCP tomorrow as scheduled- discuss med change        Flint Melter, MD 09/09/11 1955

## 2011-09-09 NOTE — ED Notes (Signed)
Pt. ambulated on cardiac monitior. Pt's heart rate varied from 99-100 on first 300 feet. Pt. went around circle (300 feet = 1 circle) 4xs. Last 2 circles, pt. did a brisk walk. Heartrate never went past 115-116. Documentation of heart rhythm from first 300 feet and last 300 feet is in chart. MD Effie Shy aware of ambulation and received documents of rhythms. RN Burnett Harry also notified.

## 2011-09-09 NOTE — ED Notes (Signed)
MD at bedside. Dr. Wentz at bedside.  

## 2011-09-09 NOTE — ED Notes (Signed)
Pt reports he was sent here by Urology d/t irregular heart rhythm.  Pt denies any SOB, CP or dizziness at this time but reports dizziness when he stands up.  Pt in no distress.

## 2011-09-09 NOTE — Discharge Instructions (Signed)
When you see a doctor, tell them that your potassium was 5.2, and that we told you to stop taking your lisinopril. They may need to adjust your medicines.   Hyperkalemia Hyperkalemia means you have too much potassium in your blood. Potassium is a type of salt in the blood (electrolyte). Normally, your kidneys remove potassium from the body. Too much potassium can be life-threatening. HOME CARE  Only take medicine as told by your doctor.   Do not take vitamins or natural products unless your doctor says they are okay.   Keep all doctor visits as told.   Follow diet instructions as told by your doctor.  GET HELP RIGHT AWAY IF:  Your heartbeat is not regular or very slow.   You feel dizzy (lightheaded).   You feel weak.   You are short of breath.   You have chest pain.   You pass out (faint).  MAKE SURE YOU:   Understand these instructions.   Will watch your condition.   Will get help right away if you are not doing well or get worse.  Document Released: 04/06/2005 Document Revised: 03/26/2011 Document Reviewed: 09/24/2010 [facility] Patient Information 2012 [place], [place].Premature Beats A premature beat is an extra heartbeat that happens earlier than normal. Premature beats are called premature atrial contractions (PACs) or premature ventricular contractions (PVCs) depending on the area of the heart where they start. CAUSES  Premature beats may be brought on by a variety of factors including:  Emotional stress.   Lack of sleep.   Caffeine.   Asthma medicines.   Stimulants.   Herbal teas.   Dietary supplements.   Alcohol.  In most cases, premature beats are not dangerous and are not a sign of serious heart disease. Most patients evaluated for premature beats have completely normal heart function. Rarely, premature beats may be a sign of more significant heart problems or medical illness. SYMPTOMS  Premature beats may cause palpitations. This means you feel like  your heart is skipping a beat or beating harder than usual. Sometimes, slight chest pain occurs with premature beats, lasting only a few seconds. This pain has been described as a "flopping" feeling inside the chest. In many cases, premature beats do not cause any symptoms and they are only detected when an electrocardiography test (EKG) or heart monitoring is performed. DIAGNOSIS  Your caregiver may run some tests to evaluate your heart such as an EKG or echocardiography. You may need to wear a portable heart monitor for several days to record the electrical activity of your heart. Blood testing may also be performed to check your electrolytes and thyroid function. TREATMENT  Premature beats usually go away with rest. If the problem continues, your caregiver will determine a treatment plan for you.  HOME CARE INSTRUCTIONS  Get plenty of rest over the next few days until your symptoms improve.   Avoid coffee, tea, alcohol, and soda (pop, cola).   Do not smoke.  SEEK MEDICAL CARE IF:  Your symptoms continue after 1 to 2 days of rest.   You have new symptoms, such as chest pain or trouble breathing.  SEEK IMMEDIATE MEDICAL CARE IF:  You have severe chest pain or abdominal pain.   You have pain that radiates into the neck, arm, or jaw.   You faint or have extreme weakness.   You have shortness of breath.   Your heartbeat races for more than 5 seconds.  MAKE SURE YOU:  Understand these instructions.   Will watch  your condition.   Will get help right away if you are not doing well or get worse.  Document Released: 05/14/2004 Document Revised: 03/26/2011 Document Reviewed: 12/08/2010 [facility] Patient Information 2012 [place], [place].

## 2011-09-21 ENCOUNTER — Telehealth: Payer: Self-pay | Admitting: *Deleted

## 2011-09-21 ENCOUNTER — Other Ambulatory Visit (HOSPITAL_BASED_OUTPATIENT_CLINIC_OR_DEPARTMENT_OTHER): Payer: Medicare Other | Admitting: Lab

## 2011-09-21 DIAGNOSIS — C9000 Multiple myeloma not having achieved remission: Secondary | ICD-10-CM

## 2011-09-21 LAB — BASIC METABOLIC PANEL
BUN: 18 mg/dL (ref 6–23)
Chloride: 93 mEq/L — ABNORMAL LOW (ref 96–112)
Creatinine, Ser: 1.03 mg/dL (ref 0.50–1.35)
Glucose, Bld: 86 mg/dL (ref 70–99)
Potassium: 4.7 mEq/L (ref 3.5–5.3)

## 2011-09-21 NOTE — Telephone Encounter (Signed)
Message copied by Wende Mott on Mon Sep 21, 2011  5:30 PM ------      Message from: HA, Raliegh Ip T      Created: Mon Sep 21, 2011  4:39 PM       Please call pt.  His renal function and calcium are still normal.  His myeloma is still smoldering; not active yet.  I again recommend watchful observation.  Thanks.

## 2011-09-21 NOTE — Telephone Encounter (Signed)
Called pt w/ lab results and informed per Dr. Gaylyn Rong that his Myeloma is still smoldering but not active yet.  Per Dr. Gaylyn Rong no lab needed on 6/17,  Next lab on 6/24 and office visit on 7/01.  Pt verbalized understanding.  POF sent to scheduling to cancel lab on 6/17.

## 2011-10-05 ENCOUNTER — Other Ambulatory Visit: Payer: Medicare Other | Admitting: Lab

## 2011-10-12 ENCOUNTER — Other Ambulatory Visit: Payer: Medicare Other | Admitting: Lab

## 2011-10-13 ENCOUNTER — Other Ambulatory Visit (HOSPITAL_BASED_OUTPATIENT_CLINIC_OR_DEPARTMENT_OTHER): Payer: Medicare Other | Admitting: Lab

## 2011-10-13 DIAGNOSIS — C9000 Multiple myeloma not having achieved remission: Secondary | ICD-10-CM

## 2011-10-13 LAB — CBC WITH DIFFERENTIAL/PLATELET
BASO%: 0.4 % (ref 0.0–2.0)
EOS%: 1.6 % (ref 0.0–7.0)
Eosinophils Absolute: 0.1 10*3/uL (ref 0.0–0.5)
LYMPH%: 35.6 % (ref 14.0–49.0)
MCH: 32.2 pg (ref 27.2–33.4)
MCHC: 33.5 g/dL (ref 32.0–36.0)
MCV: 96.2 fL (ref 79.3–98.0)
MONO%: 15.1 % — ABNORMAL HIGH (ref 0.0–14.0)
NEUT#: 1.8 10*3/uL (ref 1.5–6.5)
Platelets: 218 10*3/uL (ref 140–400)
RBC: 4.03 10*6/uL — ABNORMAL LOW (ref 4.20–5.82)
RDW: 13.1 % (ref 11.0–14.6)

## 2011-10-18 NOTE — Patient Instructions (Addendum)
1.  Smoldering myeloma:  No clear progression to active myeloma.  Slight elevated Cr could still within variation.   2.  Recommendation:  Continue watchful observation with lab appointment in 2 weeks and then 2 months.  Return visit in about 4 months.  However, if renal function worsens in the interval, I may consider starting therapy.

## 2011-10-19 ENCOUNTER — Encounter: Payer: Self-pay | Admitting: Oncology

## 2011-10-19 ENCOUNTER — Telehealth: Payer: Self-pay | Admitting: Oncology

## 2011-10-19 ENCOUNTER — Ambulatory Visit (HOSPITAL_BASED_OUTPATIENT_CLINIC_OR_DEPARTMENT_OTHER): Payer: Medicare Other | Admitting: Oncology

## 2011-10-19 VITALS — BP 131/85 | HR 93 | Temp 97.0°F | Ht 70.0 in | Wt 162.1 lb

## 2011-10-19 DIAGNOSIS — D472 Monoclonal gammopathy: Secondary | ICD-10-CM | POA: Insufficient documentation

## 2011-10-19 DIAGNOSIS — C9000 Multiple myeloma not having achieved remission: Secondary | ICD-10-CM

## 2011-10-19 HISTORY — DX: Multiple myeloma not having achieved remission: C90.00

## 2011-10-19 HISTORY — DX: Monoclonal gammopathy: D47.2

## 2011-10-19 LAB — SPEP & IFE WITH QIG
Albumin ELP: 61.3 % (ref 55.8–66.1)
Alpha-1-Globulin: 4.4 % (ref 2.9–4.9)
Beta 2: 3.6 % (ref 3.2–6.5)
Gamma Globulin: 15.8 % (ref 11.1–18.8)
IgA: 149 mg/dL (ref 68–379)
IgM, Serum: 36 mg/dL — ABNORMAL LOW (ref 41–251)

## 2011-10-19 LAB — COMPREHENSIVE METABOLIC PANEL
ALT: 19 U/L (ref 0–53)
AST: 29 U/L (ref 0–37)
Albumin: 4.6 g/dL (ref 3.5–5.2)
Alkaline Phosphatase: 72 U/L (ref 39–117)
Potassium: 4.1 mEq/L (ref 3.5–5.3)
Sodium: 133 mEq/L — ABNORMAL LOW (ref 135–145)
Total Bilirubin: 0.5 mg/dL (ref 0.3–1.2)
Total Protein: 6.9 g/dL (ref 6.0–8.3)

## 2011-10-19 LAB — KAPPA/LAMBDA LIGHT CHAINS: Lambda Free Lght Chn: 0.19 mg/dL — ABNORMAL LOW (ref 0.57–2.63)

## 2011-10-19 NOTE — Telephone Encounter (Signed)
appts made and printed for pt aom °

## 2011-10-19 NOTE — Progress Notes (Signed)
[facility]  Telephone:(336) [phone] Fax:(336) [phone]   OFFICE PROGRESS NOTE   Cc:  No primary provider on file.  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 Presenting sign was mild anemia with Hgb of 11.8. Work up showed M-spike of 0.88. BM biopsy showed 8% plasma cell. Skeletal survey showed no lytic lesion.   CURRENT THERAPY: watchful observation  INTERVAL HISTORY: ALICK LECOMTE 71 y.o. male returns for regular follow up.  He has been having nasal drainage from allergy.  He has been having cough with clear phlegm.  He denies fever, SOB, chest pain, hemoptysis, pleurisy. He denied fatigue; he still works full time at a moving company.   Patient denies fever, anorexia, weight loss, headache, visual changes, confusion, drenching night sweats, palpable lymph node swelling, mucositis, odynophagia, dysphagia, nausea vomiting, jaundice, gum bleeding, epistaxis, hematemesis, hemoptysis, abdominal pain, abdominal swelling, early satiety, melena, hematochezia, hematuria, skin rash, spontaneous bleeding, joint swelling, joint pain, heat or cold intolerance, bowel bladder incontinence, back pain, focal motor weakness, paresthesia, depression, suicidal or homocidal ideation, feeling hopelessness.   Past Medical History  Diagnosis Date  . Hypertension   . Leukocytopenia   . Asthma   . Abdominal pain, chronic, generalized   . GERD (gastroesophageal reflux disease)   . BPH (benign prostatic hyperplasia)   . Smoldering myeloma 10/19/2011    No past surgical history on file.  Current Outpatient Prescriptions  Medication Sig Dispense Refill  . amLODipine (NORVASC) 10 MG tablet Take 10 mg by mouth daily.        Marland Kitchen esomeprazole (NEXIUM) 40 MG capsule Take 40 mg by mouth daily before breakfast.        . fluticasone (FLONASE) 50 MCG/ACT nasal spray Place 1 spray into the nose 2 (two) times daily.      Marland Kitchen gabapentin (NEURONTIN) 300 MG capsule Take 300 mg by  mouth 3 (three) times daily.        . pentosan polysulfate (ELMIRON) 100 MG capsule Take 100 mg by mouth 3 (three) times daily before meals.       Marland Kitchen PROAIR HFA 108 (90 BASE) MCG/ACT inhaler Inhale 1-2 puffs into the lungs as needed. For asthma.      . Tamsulosin HCl (FLOMAX) 0.4 MG CAPS Take 0.4 mg by mouth daily.       . Tolterodine Tartrate (DETROL LA PO) Take 4 mg by mouth daily.       Marland Kitchen venlafaxine (EFFEXOR-XR) 150 MG 24 hr capsule Take 150 mg by mouth daily.          ALLERGIES:   has no known allergies.  REVIEW OF SYSTEMS:  The rest of the 14-point review of system was negative.   Filed Vitals:   10/19/11 1502  BP: 131/85  Pulse: 93  Temp: 97 F (36.1 C)   Wt Readings from Last 3 Encounters:  10/19/11 162 lb 1.6 oz (73.528 kg)  07/13/11 156 lb 9.6 oz (71.033 kg)  05/07/11 158 lb 9.6 oz (71.94 kg)   ECOG Performance status: 0  PHYSICAL EXAMINATION:   General: well-nourished man in no acute distress. Eyes: no scleral icterus. ENT: There were no oropharyngeal lesions. Neck was without thyromegaly. Lymphatics: Negative cervical, supraclavicular or axillary adenopathy. Respiratory: lungs were clear bilaterally without wheezing or crackles. Cardiovascular: Regular rate and rhythm, S1/S2, without murmur, rub or gallop. There was no pedal edema. GI: abdomen was soft, flat, nontender, nondistended, without organomegaly. Muscoloskeletal: no spinal tenderness of palpation of vertebral spine. There  was no pain in bilateral hip or iliac with passive movement of his legs. Skin exam was without echymosis, petichae. Neuro exam was nonfocal. Patient was able to get on and off exam table without assistance. Gait was normal. Patient was alerted and oriented. Attention was good. Language was appropriate. Mood was normal without depression. Speech was not pressured. Thought content was not tangential.     LABORATORY/RADIOLOGY DATA:  Lab Results  Component Value Date   WBC 3.8* 10/13/2011   HGB  13.0 10/13/2011   HCT 38.7 10/13/2011   PLT 218 10/13/2011   GLUCOSE 125* 10/13/2011   CHOL 122 04/05/2007   TRIG 73 04/05/2007   HDL 53 04/05/2007   LDLCALC 54 04/05/2007   ALKPHOS 72 10/13/2011   ALT 19 10/13/2011   AST 29 10/13/2011   NA 133* 10/13/2011   K 4.1 10/13/2011   CL 95* 10/13/2011   CREATININE 1.37* 10/13/2011   BUN 18 10/13/2011   CO2 31 10/13/2011   PSA 1.92 08/20/2009   INR 0.91 10/21/2010   SPEP:  M-spike stable at 0.84.   ASSESSMENT AND PLAN:   1. History of prostate cancer, status post radiation therapy between December 2010 and February 2011. He is NED and he follows with Dr Dayton Scrape and Dr Reche Dixon.   2. History of hypertension: He is on amlodipine per PCP with good control.   3. Benign prostatic hypertrophy: He is on tamsulosin per PCP.   4. Chronic leukocytopenia: mild; stable.  Most likely normal variation.  There was no myeloproliferative disease on last bone marrow biopsy.   5. Smoldering myeloma: He has stable M-spike without anemia, hypercalcemia.  I again recommended observation.  Since his Cr slightly elevated, I will check his BMET in 2 wks and again in 2 months if stable in 2 wks.  We will see him again in 4 months.  In the future, if and when there is concern for progression to active myeloma, I may consider repeating bone marrow biopsy.  Mr. Ditullio expressed informed understanding and wished to pursue observation for now.   6.  Seasonal allergy:  OTC meds prn.  There was no evidence of pneumonia on exam today.        The length of time of the face-to-face encounter was 15 minutes. More than 50% of time was spent counseling and coordination of care.

## 2011-11-02 ENCOUNTER — Other Ambulatory Visit (HOSPITAL_BASED_OUTPATIENT_CLINIC_OR_DEPARTMENT_OTHER): Payer: Medicare Other | Admitting: Lab

## 2011-11-02 DIAGNOSIS — D72819 Decreased white blood cell count, unspecified: Secondary | ICD-10-CM

## 2011-11-02 DIAGNOSIS — D649 Anemia, unspecified: Secondary | ICD-10-CM

## 2011-11-02 DIAGNOSIS — K219 Gastro-esophageal reflux disease without esophagitis: Secondary | ICD-10-CM

## 2011-11-02 DIAGNOSIS — Z23 Encounter for immunization: Secondary | ICD-10-CM

## 2011-11-02 DIAGNOSIS — N400 Enlarged prostate without lower urinary tract symptoms: Secondary | ICD-10-CM

## 2011-11-02 DIAGNOSIS — C9000 Multiple myeloma not having achieved remission: Secondary | ICD-10-CM

## 2011-11-02 LAB — CBC WITH DIFFERENTIAL/PLATELET
Basophils Absolute: 0 10*3/uL (ref 0.0–0.1)
Eosinophils Absolute: 0.1 10*3/uL (ref 0.0–0.5)
HCT: 37.9 % — ABNORMAL LOW (ref 38.4–49.9)
HGB: 13.1 g/dL (ref 13.0–17.1)
NEUT#: 2.1 10*3/uL (ref 1.5–6.5)
NEUT%: 55.7 % (ref 39.0–75.0)
RDW: 13 % (ref 11.0–14.6)
lymph#: 1 10*3/uL (ref 0.9–3.3)

## 2011-11-02 LAB — BASIC METABOLIC PANEL
CO2: 30 mEq/L (ref 19–32)
Calcium: 9.6 mg/dL (ref 8.4–10.5)
Chloride: 99 mEq/L (ref 96–112)
Creatinine, Ser: 0.97 mg/dL (ref 0.50–1.35)
Sodium: 135 mEq/L (ref 135–145)

## 2011-11-03 ENCOUNTER — Telehealth: Payer: Self-pay

## 2011-11-03 NOTE — Telephone Encounter (Signed)
Message copied by Kallie Locks on Tue Nov 03, 2011  3:50 PM ------      Message from: HA, Raliegh Ip T      Created: Mon Nov 02, 2011  3:29 PM       Please call pt.  His myeloma is still smoldering, not active.  His kidney function was normal today.  Please continue observation with lab in about 2 months (as previously arranged).  Thanks

## 2011-11-06 ENCOUNTER — Telehealth: Payer: Self-pay

## 2011-11-06 NOTE — Telephone Encounter (Signed)
Message copied by Kallie Locks on Fri Nov 06, 2011 11:30 AM ------      Message from: HA, Raliegh Ip T      Created: Mon Nov 02, 2011  3:29 PM       Please call pt.  His myeloma is still smoldering, not active.  His kidney function was normal today.  Please continue observation with lab in about 2 months (as previously arranged).  Thanks

## 2011-11-13 ENCOUNTER — Telehealth: Payer: Self-pay | Admitting: *Deleted

## 2011-11-13 ENCOUNTER — Other Ambulatory Visit: Payer: Self-pay | Admitting: Oncology

## 2011-11-13 DIAGNOSIS — C9000 Multiple myeloma not having achieved remission: Secondary | ICD-10-CM

## 2011-11-13 DIAGNOSIS — IMO0001 Reserved for inherently not codable concepts without codable children: Secondary | ICD-10-CM

## 2011-11-13 NOTE — Telephone Encounter (Signed)
Pt called requesting to speak with a nurse due to he is having difficulty breathing when he walks. Transferred the call over to the desk nurse being that i was not the person he wanted to talk with.

## 2011-11-13 NOTE — Telephone Encounter (Signed)
Pt called reports sob w/ walking.  Feeling tired and "can't hardly breathe" with walking about one city block.  States "I used to be able to walk downtown and back w/o getting winded, now I feel so sob and tired."  Denies any chest pains or sob at rest.  States history of asthma.  Denies any wheezing or fevers.  States occasional clear sputum when coughing. States symptoms aprox one week.   Notified Dr. Gaylyn Rong and he instructs to inform pt he will order Echocardiogram.  Called pt back and informed him of test ordered to check his heart and to expect call from scheduler to arrange date/time.  Instructed him to call 911 for any resp distress/ sob at rest and to "take it easy" until his echo. Pt verbalized understanding.

## 2011-11-18 ENCOUNTER — Other Ambulatory Visit: Payer: Medicare Other | Admitting: Lab

## 2011-11-19 ENCOUNTER — Telehealth: Payer: Self-pay | Admitting: *Deleted

## 2011-11-19 NOTE — Telephone Encounter (Signed)
Made patient appointment for echo on 11-26-2011 at 10:00am

## 2011-11-26 ENCOUNTER — Ambulatory Visit (HOSPITAL_COMMUNITY): Payer: Medicare Other | Attending: Oncology

## 2012-01-04 ENCOUNTER — Other Ambulatory Visit (HOSPITAL_BASED_OUTPATIENT_CLINIC_OR_DEPARTMENT_OTHER): Payer: Medicare Other | Admitting: Lab

## 2012-01-04 DIAGNOSIS — C9000 Multiple myeloma not having achieved remission: Secondary | ICD-10-CM

## 2012-01-04 LAB — CBC WITH DIFFERENTIAL/PLATELET
Basophils Absolute: 0 10*3/uL (ref 0.0–0.1)
Eosinophils Absolute: 0 10*3/uL (ref 0.0–0.5)
HCT: 35 % — ABNORMAL LOW (ref 38.4–49.9)
HGB: 11.9 g/dL — ABNORMAL LOW (ref 13.0–17.1)
LYMPH%: 42.3 % (ref 14.0–49.0)
MCH: 32.9 pg (ref 27.2–33.4)
MCV: 96.9 fL (ref 79.3–98.0)
MONO%: 22 % — ABNORMAL HIGH (ref 0.0–14.0)
NEUT#: 1.2 10*3/uL — ABNORMAL LOW (ref 1.5–6.5)
NEUT%: 34 % — ABNORMAL LOW (ref 39.0–75.0)
Platelets: 200 10*3/uL (ref 140–400)

## 2012-01-04 LAB — COMPREHENSIVE METABOLIC PANEL (CC13)
Albumin: 3.9 g/dL (ref 3.5–5.0)
Alkaline Phosphatase: 63 U/L (ref 40–150)
BUN: 20 mg/dL (ref 7.0–26.0)
Creatinine: 1 mg/dL (ref 0.7–1.3)
Glucose: 77 mg/dl (ref 70–99)
Potassium: 4.2 mEq/L (ref 3.5–5.1)
Total Bilirubin: 0.5 mg/dL (ref 0.20–1.20)

## 2012-03-01 ENCOUNTER — Encounter: Payer: Self-pay | Admitting: Oncology

## 2012-03-01 ENCOUNTER — Other Ambulatory Visit (HOSPITAL_BASED_OUTPATIENT_CLINIC_OR_DEPARTMENT_OTHER): Payer: Medicare Other | Admitting: Lab

## 2012-03-01 ENCOUNTER — Ambulatory Visit (HOSPITAL_BASED_OUTPATIENT_CLINIC_OR_DEPARTMENT_OTHER): Payer: Medicare Other | Admitting: Oncology

## 2012-03-01 VITALS — BP 135/80 | HR 97 | Temp 96.7°F | Resp 20 | Ht 70.0 in | Wt 158.6 lb

## 2012-03-01 DIAGNOSIS — C9000 Multiple myeloma not having achieved remission: Secondary | ICD-10-CM

## 2012-03-01 DIAGNOSIS — D72819 Decreased white blood cell count, unspecified: Secondary | ICD-10-CM

## 2012-03-01 DIAGNOSIS — C61 Malignant neoplasm of prostate: Secondary | ICD-10-CM

## 2012-03-01 LAB — CBC WITH DIFFERENTIAL/PLATELET
BASO%: 0.4 % (ref 0.0–2.0)
EOS%: 3 % (ref 0.0–7.0)
HGB: 13.3 g/dL (ref 13.0–17.1)
LYMPH%: 34.4 % (ref 14.0–49.0)
MCH: 33.4 pg (ref 27.2–33.4)
MCHC: 33.8 g/dL (ref 32.0–36.0)
MONO#: 0.5 10*3/uL (ref 0.1–0.9)
NEUT#: 1.5 10*3/uL (ref 1.5–6.5)
NEUT%: 46 % (ref 39.0–75.0)
Platelets: 183 10*3/uL (ref 140–400)
RBC: 3.97 10*6/uL — ABNORMAL LOW (ref 4.20–5.82)

## 2012-03-01 LAB — COMPREHENSIVE METABOLIC PANEL (CC13)
AST: 24 U/L (ref 5–34)
Albumin: 4.3 g/dL (ref 3.5–5.0)
Alkaline Phosphatase: 75 U/L (ref 40–150)
BUN: 17 mg/dL (ref 7.0–26.0)
Creatinine: 1.1 mg/dL (ref 0.7–1.3)
Glucose: 103 mg/dl — ABNORMAL HIGH (ref 70–99)
Potassium: 4.1 mEq/L (ref 3.5–5.1)
Total Bilirubin: 0.42 mg/dL (ref 0.20–1.20)

## 2012-03-01 MED ORDER — ALBUTEROL SULFATE HFA 108 (90 BASE) MCG/ACT IN AERS: 1.0000 | INHALATION_SPRAY | Freq: Four times a day (QID) | RESPIRATORY_TRACT | Status: DC | PRN

## 2012-03-02 NOTE — Progress Notes (Signed)
[facility]  Telephone:(336) [phone] Fax:(336) [phone]   OFFICE PROGRESS NOTE   Cc:  No primary provider on file.  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 Presenting sign was mild anemia with Hgb of 11.8. Work up showed M-spike of 0.88. BM biopsy showed 8% plasma cell. Skeletal survey showed no lytic lesion.   CURRENT THERAPY: watchful observation  INTERVAL HISTORY: Steven Beasley 71 y.o. male returns for regular follow up.  He has been having nasal drainage from allergy.  He has been having cough with clear phlegm.  He denies fever, SOB, chest pain, hemoptysis, pleurisy. States he is almost out of his inhaler and does not have a a PCP currently due to [facility] closing. He denied fatigue. He denies any bone pain.   Patient denies fever, anorexia, weight loss, headache, visual changes, confusion, drenching night sweats, palpable lymph node swelling, mucositis, odynophagia, dysphagia, nausea vomiting, jaundice, gum bleeding, epistaxis, hematemesis, hemoptysis, abdominal pain, abdominal swelling, early satiety, melena, hematochezia, hematuria, skin rash, spontaneous bleeding, joint swelling, joint pain, heat or cold intolerance, bowel bladder incontinence, back pain, focal motor weakness, paresthesia, depression, suicidal or homocidal ideation, feeling hopelessness.   Past Medical History  Diagnosis Date  . Hypertension   . Leukocytopenia   . Asthma   . Abdominal pain, chronic, generalized   . GERD (gastroesophageal reflux disease)   . BPH (benign prostatic hyperplasia)   . 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 No pertinent past surgical history.  Current Outpatient Prescriptions  Medication Sig Dispense Refill  . albuterol (PROAIR HFA) 108 (90 BASE) MCG/ACT inhaler Inhale 1-2 puffs into the lungs every 6 (six) hours as needed for wheezing. For asthma.  18 g  1  . amLODipine (NORVASC) 10 MG tablet Take 10 mg by mouth daily.         Marland Kitchen esomeprazole (NEXIUM) 40 MG capsule Take 40 mg by mouth daily before breakfast.        . fluticasone (FLONASE) 50 MCG/ACT nasal spray Place 1 spray into the nose 2 (two) times daily.      Marland Kitchen gabapentin (NEURONTIN) 300 MG capsule Take 300 mg by mouth 3 (three) times daily.        . pentosan polysulfate (ELMIRON) 100 MG capsule Take 100 mg by mouth 3 (three) times daily before meals.       . Tamsulosin HCl (FLOMAX) 0.4 MG CAPS Take 0.4 mg by mouth daily.       . Tolterodine Tartrate (DETROL LA PO) Take 4 mg by mouth daily.       Marland Kitchen venlafaxine (EFFEXOR-XR) 150 MG 24 hr capsule Take 150 mg by mouth daily.          ALLERGIES:   has no known allergies.  REVIEW OF SYSTEMS:  The rest of the 14-point review of system was negative.   Filed Vitals:   03/01/12 0940  BP: 135/80  Pulse: 97  Temp: 96.7 F (35.9 C)  Resp: 20   Wt Readings from Last 3 Encounters:  03/01/12 158 lb 9.6 oz (71.94 kg)  10/19/11 162 lb 1.6 oz (73.528 kg)  07/13/11 156 lb 9.6 oz (71.033 kg)   ECOG Performance status: 0  PHYSICAL EXAMINATION:   General: well-nourished man in no acute distress. Eyes: no scleral icterus. ENT: There were no oropharyngeal lesions. Neck was without thyromegaly. Lymphatics: Negative cervical, supraclavicular or axillary adenopathy. Respiratory: lungs with expiratory wheezing bilaterally. No crackles. Cardiovascular: Regular rate and rhythm, S1/S2, without murmur,  rub or gallop. There was no pedal edema. GI: abdomen was soft, flat, nontender, nondistended, without organomegaly. Muscoloskeletal: no spinal tenderness of palpation of vertebral spine. There was no pain in bilateral hip or iliac with passive movement of his legs. Skin exam was without echymosis, petichae. Neuro exam was nonfocal. Patient was able to get on and off exam table without assistance. Gait was normal. Patient was alerted and oriented. Attention was good. Language was appropriate. Mood was normal without depression.  Speech was not pressured. Thought content was not tangential.     LABORATORY/RADIOLOGY DATA:  Lab Results  Component Value Date   WBC 3.3* 03/01/2012   HGB 13.3 03/01/2012   HCT 39.2 03/01/2012   PLT 183 03/01/2012   GLUCOSE 103* 03/01/2012   CHOL 122 04/05/2007   TRIG 73 04/05/2007   HDL 53 04/05/2007   LDLCALC 54 04/05/2007   ALKPHOS 75 03/01/2012   ALT 19 03/01/2012   AST 24 03/01/2012   NA 138 03/01/2012   K 4.1 03/01/2012   CL 98 03/01/2012   CREATININE 1.1 03/01/2012   BUN 17.0 03/01/2012   CO2 34* 03/01/2012   PSA 1.92 08/20/2009   INR 0.91 10/21/2010  Calcium 10.9  ASSESSMENT AND PLAN:   1. History of prostate cancer, status post radiation therapy between December 2010 and February 2011. He is NED and he follows with Dr Dayton Scrape and Dr Reche Dixon.   2. History of hypertension: He is on amlodipine per PCP with good control.   3. Benign prostatic hypertrophy: He is on tamsulosin per PCP.   4. Chronic leukocytopenia: mild; stable.  Most likely normal variation.  There was no myeloproliferative disease on last bone marrow biopsy.   5. Smoldering myeloma: M-spike is pending. He has no anemia and creatinine is normal today. Calcium is elevated. Smoldering myeloma may now be active. Labs dicussed with Dr Gaylyn Rong. Recommend a repeat bone marrow biopsy within 7-10 days. Dr Gaylyn Rong will arrange for this. Follow-up appointment will be scheduled pending bone marrow biopsy.  6.  Seasonal allergy:  OTC meds prn.  There was no evidence of pneumonia on exam today. I have refilled his ProAir for him today as he does not have a PCP and he is wheezing.       The length of time of the face-to-face encounter was 15 minutes. More than 50% of time was spent counseling and coordination of care.

## 2012-03-03 LAB — PROTEIN ELECTROPHORESIS, SERUM
Alpha-2-Globulin: 10.5 % (ref 7.1–11.8)
Gamma Globulin: 16.1 % (ref 11.1–18.8)
M-Spike, %: 0.93 g/dL
Total Protein, Serum Electrophoresis: 7.4 g/dL (ref 6.0–8.3)

## 2012-03-03 LAB — KAPPA/LAMBDA LIGHT CHAINS
Kappa free light chain: 4.7 mg/dL — ABNORMAL HIGH (ref 0.33–1.94)
Lambda Free Lght Chn: 0.34 mg/dL — ABNORMAL LOW (ref 0.57–2.63)

## 2012-03-08 ENCOUNTER — Ambulatory Visit (HOSPITAL_BASED_OUTPATIENT_CLINIC_OR_DEPARTMENT_OTHER): Payer: Medicare Other | Admitting: Oncology

## 2012-03-08 ENCOUNTER — Other Ambulatory Visit (HOSPITAL_COMMUNITY)
Admission: RE | Admit: 2012-03-08 | Discharge: 2012-03-08 | Disposition: A | Discharge status: Home / Self Care | Payer: Medicare Other | Source: Ambulatory Visit | Attending: Oncology | Admitting: Oncology

## 2012-03-08 ENCOUNTER — Other Ambulatory Visit: Payer: Medicare Other

## 2012-03-08 VITALS — BP 113/71 | HR 92 | Temp 97.0°F | Resp 18

## 2012-03-08 DIAGNOSIS — D72819 Decreased white blood cell count, unspecified: Secondary | ICD-10-CM | POA: Insufficient documentation

## 2012-03-08 DIAGNOSIS — C9000 Multiple myeloma not having achieved remission: Secondary | ICD-10-CM

## 2012-03-08 DIAGNOSIS — D72822 Plasmacytosis: Secondary | ICD-10-CM | POA: Insufficient documentation

## 2012-03-08 NOTE — Progress Notes (Signed)
Patient tolerated Bone Marrow Biopsy well; biopsy site has no signs of bleeding; discharged home with no complaints.

## 2012-03-08 NOTE — Procedures (Signed)
   [facility]  Telephone:([phone]) [phone] Fax:(336) [phone]   BONE MARROW BIOPSY AND ASPIRATION   INDICATION:  MGUS; now with intermittent anemia and hypercalcemia, ruling out progression to active myeloma.   Procedure: After obtained from consent, Steven Beasley was placed in the prone position. Time out was performed verifying correct patient and procedure. The skin overlying the left posterior crest was prepped with Betadine and draped in the usual sterile fashion. The skin and periosteum were infiltrated with 10 mL of 2% lidocaine. A small puncture wound was made with #11 scalpel blade.  Bone marrow aspirate was obtained on the first pass of the aspiration needle.  One core biopsy was obtained through the same incision.   The aspirate was sent for routine histology, flow cytometry, FISH myeloma panel, and cytogenetics.  Core biopsy was sent for routine histology.   Steven Beasley tolerated procedure well with minimal  blood loss and without immediate complication.   A sterile dressing was applied.   Jethro Bolus M.D. 03/08/2012

## 2012-03-08 NOTE — Patient Instructions (Signed)
Patient aware of next appointment; discharged home with no complaints. 

## 2012-03-11 ENCOUNTER — Telehealth: Payer: Self-pay | Admitting: Oncology

## 2012-03-11 ENCOUNTER — Other Ambulatory Visit: Payer: Self-pay | Admitting: Oncology

## 2012-03-11 DIAGNOSIS — C9000 Multiple myeloma not having achieved remission: Secondary | ICD-10-CM

## 2012-03-11 NOTE — Telephone Encounter (Signed)
called pt's mobile # and it is not in service and there was no answer ....mailed appt schedule for NOV and Dec to pt.

## 2012-03-18 ENCOUNTER — Other Ambulatory Visit (HOSPITAL_BASED_OUTPATIENT_CLINIC_OR_DEPARTMENT_OTHER): Payer: Medicare Other

## 2012-03-18 DIAGNOSIS — C9000 Multiple myeloma not having achieved remission: Secondary | ICD-10-CM

## 2012-03-18 DIAGNOSIS — I10 Essential (primary) hypertension: Secondary | ICD-10-CM

## 2012-03-18 LAB — CBC WITH DIFFERENTIAL/PLATELET
BASO%: 0.4 % (ref 0.0–2.0)
Eosinophils Absolute: 0.1 10*3/uL (ref 0.0–0.5)
LYMPH%: 40.8 % (ref 14.0–49.0)
MCHC: 33.5 g/dL (ref 32.0–36.0)
MCV: 98.3 fL — ABNORMAL HIGH (ref 79.3–98.0)
MONO#: 0.5 10*3/uL (ref 0.1–0.9)
MONO%: 14.1 % — ABNORMAL HIGH (ref 0.0–14.0)
NEUT#: 1.3 10*3/uL — ABNORMAL LOW (ref 1.5–6.5)
Platelets: 214 10*3/uL (ref 140–400)
RBC: 3.92 10*6/uL — ABNORMAL LOW (ref 4.20–5.82)
RDW: 13.4 % (ref 11.0–14.6)
WBC: 3.2 10*3/uL — ABNORMAL LOW (ref 4.0–10.3)

## 2012-03-18 LAB — BASIC METABOLIC PANEL (CC13)
BUN: 14 mg/dL (ref 7.0–26.0)
Calcium: 10.1 mg/dL (ref 8.4–10.4)
Glucose: 74 mg/dl (ref 70–99)
Potassium: 4 mEq/L (ref 3.5–5.1)

## 2012-03-21 LAB — TISSUE HYBRIDIZATION (BONE MARROW)-NCBH

## 2012-03-21 LAB — PTH, INTACT AND CALCIUM
Calcium, Total (PTH): 9.9 mg/dL (ref 8.4–10.5)
PTH: 44.5 pg/mL (ref 14.0–72.0)

## 2012-03-21 LAB — CHROMOSOME ANALYSIS, BONE MARROW

## 2012-03-24 ENCOUNTER — Ambulatory Visit (HOSPITAL_BASED_OUTPATIENT_CLINIC_OR_DEPARTMENT_OTHER): Payer: Medicare Other | Admitting: Oncology

## 2012-03-24 ENCOUNTER — Telehealth: Payer: Self-pay | Admitting: Oncology

## 2012-03-24 VITALS — BP 146/83 | HR 93 | Temp 97.1°F | Resp 20 | Ht 70.0 in | Wt 171.5 lb

## 2012-03-24 DIAGNOSIS — Z8546 Personal history of malignant neoplasm of prostate: Secondary | ICD-10-CM

## 2012-03-24 DIAGNOSIS — D708 Other neutropenia: Secondary | ICD-10-CM

## 2012-03-24 DIAGNOSIS — C9000 Multiple myeloma not having achieved remission: Secondary | ICD-10-CM

## 2012-03-24 NOTE — Telephone Encounter (Signed)
Pt aware of appts,printed      anne

## 2012-03-24 NOTE — Patient Instructions (Addendum)
1.  Diagnosis:  Smoldering myeloma.  2.  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  However, there is still no persistent hypercalcemia.  There is not anemia or renal problem. 3.  Recommendation:  Continue observation with blood test in 3 months.  Follow up in 6 months.  In the future, if persistent anemia, hypercalcemia, or renal problem, then we will start chemo with both oral chemo Revlimid and SQ chemo Velcade given del 13.

## 2012-03-25 NOTE — Progress Notes (Signed)
[facility]  Telephone:(336) [phone] Fax:(336) [phone]   OFFICE PROGRESS NOTE   Cc:  No primary provider on file.  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 Presenting sign was mild anemia with Hgb of 11.8. Work up showed M-spike of 0.88. BM biopsy showed 8% plasma cell. Skeletal survey showed no lytic lesion.   CURRENT THERAPY: watchful observation  INTERVAL HISTORY: Steven Beasley 71 y.o. male returns for regular follow up.  He reports doing well.  He has mild chest congestion with productive cough with clear phlegm.  He denied fever, chest pain, hemoptysis.  He denied SOB, DOE, bleeding, skin rash, bone pain. He still works as a Special educational needs teacher.   The rest of the 14-point review of system was negative.   Past Medical History  Diagnosis Date  . Hypertension   . Leukocytopenia   . Asthma   . Abdominal pain, chronic, generalized   . GERD (gastroesophageal reflux disease)   . BPH (benign prostatic hyperplasia)   . Smoldering myeloma 10/19/2011    No past surgical history on file.  Current Outpatient Prescriptions  Medication Sig Dispense Refill  . albuterol (PROAIR HFA) 108 (90 BASE) MCG/ACT inhaler Inhale 1-2 puffs into the lungs every 6 (six) hours as needed for wheezing. For asthma.  18 g  1  . amLODipine (NORVASC) 10 MG tablet Take 10 mg by mouth daily.        Marland Kitchen esomeprazole (NEXIUM) 40 MG capsule Take 40 mg by mouth daily before breakfast.        . fluticasone (FLONASE) 50 MCG/ACT nasal spray Place 1 spray into the nose 2 (two) times daily.      Marland Kitchen gabapentin (NEURONTIN) 300 MG capsule Take 300 mg by mouth 3 (three) times daily.        . pentosan polysulfate (ELMIRON) 100 MG capsule Take 100 mg by mouth 3 (three) times daily before meals.       . Tamsulosin HCl (FLOMAX) 0.4 MG CAPS Take 0.4 mg by mouth daily.       . Tolterodine Tartrate (DETROL LA PO) Take 4 mg by mouth daily.       Marland Kitchen venlafaxine (EFFEXOR-XR) 150 MG 24 hr capsule  Take 150 mg by mouth daily.          ALLERGIES:   has no known allergies.  REVIEW OF SYSTEMS:  The rest of the 14-point review of system was negative.   Filed Vitals:   03/24/12 1451  BP: 146/83  Pulse: 93  Temp: 97.1 F (36.2 C)  Resp: 20   Wt Readings from Last 3 Encounters:  03/24/12 171 lb 8 oz (77.792 kg)  03/01/12 158 lb 9.6 oz (71.94 kg)  10/19/11 162 lb 1.6 oz (73.528 kg)   ECOG Performance status: 0  PHYSICAL EXAMINATION:   General: well-nourished man in no acute distress. Eyes: no scleral icterus. ENT: There were no oropharyngeal lesions. Neck was without thyromegaly. Lymphatics: Negative cervical, supraclavicular or axillary adenopathy. Respiratory: lungs were clear bilaterally without wheezing or crackles. Cardiovascular: Regular rate and rhythm, S1/S2, without murmur, rub or gallop. There was no pedal edema. GI: abdomen was soft, flat, nontender, nondistended, without organomegaly. Muscoloskeletal: no spinal tenderness of palpation of vertebral spine. There was no pain in bilateral hip or iliac with passive movement of his legs. Skin exam was without echymosis, petichae. Neuro exam was nonfocal. Patient was able to get on and off exam table without assistance. Gait was normal. Patient was  alerted and oriented. Attention was good. Language was appropriate. Mood was normal without depression. Speech was not pressured. Thought content was not tangential.     LABORATORY/RADIOLOGY DATA:  Lab Results  Component Value Date   WBC 3.2* 03/18/2012   HGB 12.9* 03/18/2012   HCT 38.5 03/18/2012   PLT 214 03/18/2012   GLUCOSE 74 03/18/2012   CHOL 122 04/05/2007   TRIG 73 04/05/2007   HDL 53 04/05/2007   LDLCALC 54 04/05/2007   ALKPHOS 75 03/01/2012   ALT 19 03/01/2012   AST 24 03/01/2012   NA 139 03/18/2012   K 4.0 03/18/2012   CL 98 03/18/2012   CREATININE 1.0 03/18/2012   BUN 14.0 03/18/2012   CO2 32* 03/18/2012   PSA 1.92 08/20/2009   INR 0.91 10/21/2010   SPEP:   M-spike stable at 0.84.   ASSESSMENT AND PLAN:   1. History of prostate cancer, status post radiation therapy between December 2010 and February 2011. He is NED and he follows with Dr Dayton Scrape and Dr Reche Dixon.   2. Hypertension: He is on amlodipine per PCP with good control.   3. Benign prostatic hypertrophy: He is on tamsulosin per PCP.   4. Chronic leukocytopenia: mild; stable.  Most likely normal variation.  There was no myeloproliferative disease on last bone marrow biopsy.   5. Smoldering myeloma: - He has had slightly worsened hypercalcemia.  This prompted restaging bone marrow last month.  Hypercalcemia work up was negative for hyperparathyroidism, VitD elevation.   - Bone marrow biopsy showed slight increase of bone marrow plasmacytosis from 8% to 13%.  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  - His hypercalcemia has been intermittent.  He does not have anemia, renal failure, nor lytic bone lesion last year.  Thus, I think that his plasma cell dyscrasia is still smoldering instead of being active. - I recommended again watchful observation.  Steven Beasley was OK with observation.  We will follow his lab in 3 months and visit in 6 months. 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   6.  Seasonal allergy:  OTC meds prn.        The length of time of the face-to-face encounter was 15 minutes. More than 50% of time was spent counseling and coordination of care.

## 2012-03-29 ENCOUNTER — Emergency Department (INDEPENDENT_AMBULATORY_CARE_PROVIDER_SITE_OTHER)
Admission: EM | Admit: 2012-03-29 | Discharge: 2012-03-29 | Disposition: A | Discharge status: Home / Self Care | Payer: Medicare Other | Source: Home / Self Care

## 2012-03-29 ENCOUNTER — Encounter (HOSPITAL_COMMUNITY): Payer: Self-pay

## 2012-03-29 DIAGNOSIS — J45901 Unspecified asthma with (acute) exacerbation: Secondary | ICD-10-CM

## 2012-03-29 DIAGNOSIS — J45909 Unspecified asthma, uncomplicated: Secondary | ICD-10-CM

## 2012-03-29 DIAGNOSIS — R05 Cough: Secondary | ICD-10-CM

## 2012-03-29 DIAGNOSIS — R0602 Shortness of breath: Secondary | ICD-10-CM

## 2012-03-29 MED ORDER — DOXYCYCLINE HYCLATE 100 MG PO CAPS: 100.0000 mg | ORAL_CAPSULE | Freq: Two times a day (BID) | ORAL | Status: DC

## 2012-03-29 MED ORDER — METHYLPREDNISOLONE SODIUM SUCC 125 MG IJ SOLR
125.0000 mg | Freq: Once | INTRAMUSCULAR | Status: AC
  Administered 2012-03-29: 125 mg via INTRAMUSCULAR

## 2012-03-29 MED ORDER — ALBUTEROL SULFATE HFA 108 (90 BASE) MCG/ACT IN AERS: 1.0000 | INHALATION_SPRAY | RESPIRATORY_TRACT | Status: DC | PRN

## 2012-03-29 MED ORDER — IPRATROPIUM-ALBUTEROL 0.5-2.5 (3) MG/3ML IN SOLN
3.0000 mL | Freq: Once | RESPIRATORY_TRACT | Status: AC
  Administered 2012-03-29: 3 mL via RESPIRATORY_TRACT

## 2012-03-29 MED ORDER — ALBUTEROL SULFATE (2.5 MG/3ML) 0.083% IN NEBU: 2.5000 mg | INHALATION_SOLUTION | RESPIRATORY_TRACT | Status: DC | PRN

## 2012-03-29 MED ORDER — ALBUTEROL SULFATE (5 MG/ML) 0.5% IN NEBU
INHALATION_SOLUTION | RESPIRATORY_TRACT | Status: AC
  Filled 2012-03-29: qty 1

## 2012-03-29 MED ORDER — FLUTICASONE PROPIONATE 50 MCG/ACT NA SUSP: 2.0000 | Freq: Every day | NASAL | Status: DC

## 2012-03-29 MED ORDER — PREDNISONE 20 MG PO TABS: 20.0000 mg | ORAL_TABLET | Freq: Every day | ORAL | Status: DC

## 2012-03-29 MED ORDER — IPRATROPIUM BROMIDE 0.02 % IN SOLN: 500.0000 ug | RESPIRATORY_TRACT | Status: DC | PRN

## 2012-03-29 MED ORDER — METHYLPREDNISOLONE SODIUM SUCC 125 MG IJ SOLR
INTRAMUSCULAR | Status: AC
  Filled 2012-03-29: qty 2

## 2012-03-29 NOTE — ED Notes (Signed)
C/o sob past couple of days

## 2012-03-29 NOTE — ED Provider Notes (Addendum)
History     CSN: [id number]  Arrival date & time 03/29/12  1244  Chief Complaint  Patient presents with  . Shortness of Breath   HPI Pt reports he has been coughing and wheezing for over 1 week, with cough and SOB, he has used up his rescue inhaler as well.  He says that he has been having a difficult time breathing and having some whitish sputum production.  Pt says that he has not been able to sleep and reports that he has felt like he would die if he did not get some relief from his SOB.   He says that he has used all of his rescue inhaler because he has been using it so frequently.  He says that he is coughing and wheezing all the time and having a difficult time catching his breath.  He gets SOB with activity and his ambulation has been diminished because of this acute attack .  He denies headache and palpatations and chest pain.  He denies fever and chills.  He does report that he has had some allergy symptoms recently.      Past Medical History  Diagnosis Date  . Hypertension   . Leukocytopenia   . Asthma   . Abdominal pain, chronic, generalized   . GERD (gastroesophageal reflux disease)   . BPH (benign prostatic hyperplasia)   . 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 No pertinent past surgical history.  No family history on file.  History  Substance Use Topics  . Smoking status: Former Games developer  . Smokeless tobacco: Not on file  . Alcohol Use:    Review of Systems  Constitutional: Positive for chills and activity change. Negative for fever, diaphoresis, fatigue and unexpected weight change.  HENT: Positive for congestion, rhinorrhea, sneezing and postnasal drip. Negative for hearing loss, ear pain, nosebleeds, neck pain, neck stiffness, tinnitus and ear discharge.   Eyes: Negative.   Respiratory: Positive for chest tightness, shortness of breath, wheezing and stridor. Negative for apnea, cough and choking.   Cardiovascular: Negative.   Gastrointestinal:  Negative.   Musculoskeletal: Negative.   Neurological: Negative.   Hematological: Negative.   Psychiatric/Behavioral: Negative.     Allergies  Review of patient's allergies indicates no known allergies.  Home Medications   Current Outpatient Rx  Name  Route  Sig  Dispense  Refill  . ALBUTEROL SULFATE HFA 108 (90 BASE) MCG/ACT IN AERS   Inhalation   Inhale 1-2 puffs into the lungs every 6 (six) hours as needed for wheezing. For asthma.   18 g   1   . AMLODIPINE BESYLATE 10 MG PO TABS   Oral   Take 10 mg by mouth daily.           Marland Kitchen ESOMEPRAZOLE MAGNESIUM 40 MG PO CPDR   Oral   Take 40 mg by mouth daily before breakfast.           . FLUTICASONE PROPIONATE 50 MCG/ACT NA SUSP   Nasal   Place 1 spray into the nose 2 (two) times daily.         Marland Kitchen GABAPENTIN 300 MG PO CAPS   Oral   Take 300 mg by mouth 3 (three) times daily.           Marland Kitchen PENTOSAN POLYSULFATE SODIUM 100 MG PO CAPS   Oral   Take 100 mg by mouth 3 (three) times daily before meals.          . TAMSULOSIN  HCL 0.4 MG PO CAPS   Oral   Take 0.4 mg by mouth daily.          Marland Kitchen DETROL LA PO   Oral   Take 4 mg by mouth daily.          . VENLAFAXINE HCL ER 150 MG PO CP24   Oral   Take 150 mg by mouth daily.             BP 123/71  Pulse 107  Temp 98 F (36.7 C) (Oral)  Resp 19  SpO2 97%  Physical Exam  Constitutional: He is oriented to person, place, and time. He appears well-developed and well-nourished. No distress.  HENT:  Head: Normocephalic and atraumatic.  Eyes: EOM are normal. Pupils are equal, round, and reactive to light.  Neck: Normal range of motion. Neck supple. No thyromegaly present.  Pulmonary/Chest: No respiratory distress. He has wheezes. He has rales.       Tight bilateral with poor air movement  Abdominal: Soft. Bowel sounds are normal.  Musculoskeletal: Normal range of motion. He exhibits edema.  Neurological: He is alert and oriented to person, place, and time.  Skin:  Skin is warm and dry.  Psychiatric: He has a normal mood and affect. His behavior is normal. Judgment and thought content normal.    ED Course  Procedures (including critical care time)  Labs Reviewed - No data to display No results found.  No diagnosis found.  MDM  IMPRESSION  Asthma Exacerbation  SOB  Wheezing  HTN   Allergic Rhinitis  RECOMMENDATIONS / PLAN Pt was monitored in office and received 2 duoneb treatments in the office. He had marked improvement in symptoms after the second treatment.  He was given a prescription to start a prednisone taper.  Pt to take prednisone 60 mg daily for 7 days, then 40 mg daily for 5 days, then 20 mg daily for 5 days.  I refilled his rescue albuterol HFA and prescribed home albuterol nebs and ipratropium nebs to mix together and use every 4 hours for the next 2-3 days as needed and then to wean down.  I prescribed doxycycline 100 mg po bid with food #14, NRF, I prescribed fluticasone NS to continue using,  And recommended he start cetirizine Zyrtec 10 mg po daily.  RTC or go to ER if no improvement or worsening of symptoms.   Solumedrol 125 mg IM given in office.    Time spent : 75 mins with greater than 50% was spent on counseling and coordination of care  FOLLOW UP 1 week for recheck or sooner   The patient was given clear instructions to go to ER or return to medical center if symptoms don't improve, worsen or new problems develop.  The patient verbalized understanding.  The patient was told to call to get lab results if they haven't heard anything in the next week.            Cleora Fleet, MD 03/29/12 1435  [place] Cyndie Mull, MD 04/06/12 [phone]

## 2012-04-05 ENCOUNTER — Other Ambulatory Visit: Payer: Medicare Other | Admitting: Lab

## 2012-04-06 ENCOUNTER — Encounter: Payer: Self-pay | Admitting: Oncology

## 2012-04-06 ENCOUNTER — Other Ambulatory Visit: Payer: Medicare Other | Admitting: Lab

## 2012-04-06 ENCOUNTER — Ambulatory Visit: Payer: Medicare Other | Admitting: Oncology

## 2012-04-07 ENCOUNTER — Encounter (HOSPITAL_COMMUNITY): Payer: Self-pay

## 2012-04-07 ENCOUNTER — Emergency Department (INDEPENDENT_AMBULATORY_CARE_PROVIDER_SITE_OTHER)
Admission: EM | Admit: 2012-04-07 | Discharge: 2012-04-07 | Disposition: A | Discharge status: Home / Self Care | Payer: Medicare Other | Source: Home / Self Care

## 2012-04-07 DIAGNOSIS — J45909 Unspecified asthma, uncomplicated: Secondary | ICD-10-CM

## 2012-04-07 DIAGNOSIS — K219 Gastro-esophageal reflux disease without esophagitis: Secondary | ICD-10-CM

## 2012-04-07 DIAGNOSIS — J45901 Unspecified asthma with (acute) exacerbation: Secondary | ICD-10-CM

## 2012-04-07 NOTE — ED Notes (Signed)
Follow up asthma  

## 2012-04-07 NOTE — ED Provider Notes (Signed)
History     CSN: [id number]  Arrival date & time 04/07/12  1247  Chief Complaint  Patient presents with  . Follow-up   HPI Pt says he is breathing much better today.   He is not having to use nebs as much and has been having much more energy and breathing on his own.   No cough and no CP and no SOB.    Past Medical History  Diagnosis Date  . Hypertension   . Leukocytopenia   . Asthma   . Abdominal pain, chronic, generalized   . GERD (gastroesophageal reflux disease)   . BPH (benign prostatic hyperplasia)   . 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 No pertinent past surgical history.  No family history on file.  History  Substance Use Topics  . Smoking status: Former Games developer  . Smokeless tobacco: Not on file  . Alcohol Use:     Review of Systems  Constitutional: Negative.   HENT: Negative.   Eyes: Negative.   Respiratory: Negative.   Cardiovascular: Negative.   Gastrointestinal: Negative.   Musculoskeletal: Negative.   Neurological: Negative.   Hematological: Negative.   Psychiatric/Behavioral: Negative.     Allergies  Review of patient's allergies indicates no known allergies.  Home Medications   Current Outpatient Rx  Name  Route  Sig  Dispense  Refill  . ALBUTEROL SULFATE HFA 108 (90 BASE) MCG/ACT IN AERS   Inhalation   Inhale 1-2 puffs into the lungs every 4 (four) hours as needed for wheezing or shortness of breath. For asthma.   18 g   5   . ALBUTEROL SULFATE (2.5 MG/3ML) 0.083% IN NEBU   Nebulization   Take 3 mLs (2.5 mg total) by nebulization every 4 (four) hours as needed for wheezing or shortness of breath.   75 mL   12   . AMLODIPINE BESYLATE 10 MG PO TABS   Oral   Take 10 mg by mouth daily.           Marland Kitchen DOXYCYCLINE HYCLATE 100 MG PO CAPS   Oral   Take 1 capsule (100 mg total) by mouth 2 (two) times daily. Take with something on your stomach   14 capsule   0   . ESOMEPRAZOLE MAGNESIUM 40 MG PO CPDR   Oral   Take 40 mg  by mouth daily before breakfast.           . FLUTICASONE PROPIONATE 50 MCG/ACT NA SUSP   Nasal   Place 2 sprays into the nose daily.   16 g   5   . GABAPENTIN 300 MG PO CAPS   Oral   Take 300 mg by mouth 3 (three) times daily.           . IPRATROPIUM BROMIDE 0.02 % IN SOLN   Nebulization   Take 2.5 mLs (500 mcg total) by nebulization every 4 (four) hours as needed for wheezing (shortness of breath).   75 mL   12   . PENTOSAN POLYSULFATE SODIUM 100 MG PO CAPS   Oral   Take 100 mg by mouth 3 (three) times daily before meals.          Marland Kitchen PREDNISONE 20 MG PO TABS   Oral   Take 1 tablet (20 mg total) by mouth daily. Take 3 tabs po daily for 7 days, then take 2 tabs po daily for 5 days, then take 1 tab po daily for 5 days   36 tablet  0   . TAMSULOSIN HCL 0.4 MG PO CAPS   Oral   Take 0.4 mg by mouth daily.          Marland Kitchen DETROL LA PO   Oral   Take 4 mg by mouth daily.          . VENLAFAXINE HCL ER 150 MG PO CP24   Oral   Take 150 mg by mouth daily.             BP 122/80  Pulse 114  Temp 97.4 F (36.3 C) (Oral)  Resp 18  SpO2 99%  Physical Exam  Nursing note and vitals reviewed. Constitutional: He is oriented to person, place, and time. He appears well-developed and well-nourished. No distress.  HENT:  Head: Normocephalic and atraumatic.  Eyes: Pupils are equal, round, and reactive to light.  Neck: Normal range of motion. Neck supple.  Cardiovascular: Normal rate, regular rhythm and normal heart sounds.   Pulmonary/Chest: Effort normal.       Rare exp wheeze   Abdominal: Soft. Bowel sounds are normal.  Neurological: He is alert and oriented to person, place, and time.  Skin: Skin is warm and dry.  Psychiatric: He has a normal mood and affect. His behavior is normal. Thought content normal.    ED Course  Procedures (including critical care time)  Labs Reviewed - No data to display No results found.   No diagnosis found.   MDM   IMPRESSION  Asthma exacerbation -  Resolved now  Asthma  RECOMMENDATIONS / PLAN  Pt much better, finish prednisone course and other meds until completed.    FOLLOW UP 3 months. Asthma check.   The patient was given clear instructions to go to ER or return to medical center if symptoms don't improve, worsen or new problems develop.  The patient verbalized understanding.  The patient was told to call to get lab results if they haven't heard anything in the next week.            Cleora Fleet, MD 04/07/12 1326

## 2012-05-30 MED ORDER — AMLODIPINE BESYLATE 10 MG PO TABS: 10.0000 mg | ORAL_TABLET | Freq: Every day | ORAL | Status: DC

## 2012-06-22 ENCOUNTER — Other Ambulatory Visit (HOSPITAL_BASED_OUTPATIENT_CLINIC_OR_DEPARTMENT_OTHER): Payer: Medicare Other

## 2012-06-22 DIAGNOSIS — C9000 Multiple myeloma not having achieved remission: Secondary | ICD-10-CM

## 2012-06-22 LAB — COMPREHENSIVE METABOLIC PANEL (CC13)
ALT: 79 U/L — ABNORMAL HIGH (ref 0–55)
AST: 50 U/L — ABNORMAL HIGH (ref 5–34)
Albumin: 3.7 g/dL (ref 3.5–5.0)
Calcium: 9.7 mg/dL (ref 8.4–10.4)
Chloride: 94 mEq/L — ABNORMAL LOW (ref 98–107)
Potassium: 4 mEq/L (ref 3.5–5.1)
Sodium: 134 mEq/L — ABNORMAL LOW (ref 136–145)

## 2012-06-22 LAB — CBC WITH DIFFERENTIAL/PLATELET
BASO%: 0.6 % (ref 0.0–2.0)
EOS%: 2.5 % (ref 0.0–7.0)
HGB: 12.9 g/dL — ABNORMAL LOW (ref 13.0–17.1)
MCH: 33.2 pg (ref 27.2–33.4)
MCHC: 34.5 g/dL (ref 32.0–36.0)
RDW: 13.6 % (ref 11.0–14.6)
lymph#: 1.3 10*3/uL (ref 0.9–3.3)

## 2012-06-24 ENCOUNTER — Encounter: Payer: Self-pay | Admitting: Oncology

## 2012-07-06 NOTE — ED Notes (Signed)
Refill for effexor called into [facility] on [address]

## 2012-09-15 ENCOUNTER — Ambulatory Visit: Payer: Medicare Other | Attending: Family Medicine | Admitting: Family Medicine

## 2012-09-15 ENCOUNTER — Encounter: Payer: Self-pay | Admitting: Family Medicine

## 2012-09-15 VITALS — BP 166/76 | HR 88 | Temp 97.8°F | Resp 15 | Wt 168.0 lb

## 2012-09-15 DIAGNOSIS — Z87891 Personal history of nicotine dependence: Secondary | ICD-10-CM | POA: Insufficient documentation

## 2012-09-15 DIAGNOSIS — M2550 Pain in unspecified joint: Secondary | ICD-10-CM | POA: Insufficient documentation

## 2012-09-15 DIAGNOSIS — R059 Cough, unspecified: Secondary | ICD-10-CM | POA: Insufficient documentation

## 2012-09-15 DIAGNOSIS — J309 Allergic rhinitis, unspecified: Secondary | ICD-10-CM | POA: Insufficient documentation

## 2012-09-15 DIAGNOSIS — J45909 Unspecified asthma, uncomplicated: Secondary | ICD-10-CM | POA: Insufficient documentation

## 2012-09-15 DIAGNOSIS — R05 Cough: Secondary | ICD-10-CM

## 2012-09-15 MED ORDER — ALBUTEROL SULFATE HFA 108 (90 BASE) MCG/ACT IN AERS: 1.0000 | INHALATION_SPRAY | RESPIRATORY_TRACT | Status: DC | PRN

## 2012-09-15 MED ORDER — DOXYCYCLINE HYCLATE 100 MG PO CAPS: 100.0000 mg | ORAL_CAPSULE | Freq: Two times a day (BID) | ORAL | Status: DC

## 2012-09-15 MED ORDER — ALBUTEROL SULFATE (2.5 MG/3ML) 0.083% IN NEBU: 2.5000 mg | INHALATION_SOLUTION | RESPIRATORY_TRACT | Status: DC | PRN

## 2012-09-15 MED ORDER — IPRATROPIUM BROMIDE 0.02 % IN SOLN: 500.0000 ug | RESPIRATORY_TRACT | Status: DC | PRN

## 2012-09-15 MED ORDER — FLUTICASONE PROPIONATE 50 MCG/ACT NA SUSP: 2.0000 | Freq: Every day | NASAL | Status: DC

## 2012-09-15 NOTE — Patient Instructions (Signed)
You can take over the counter Robitussin to help the cough  Cough, Adult  A cough is a reflex that helps clear your throat and airways. It can help heal the body or may be a reaction to an irritated airway. A cough may only last 2 or 3 weeks (acute) or may last more than 8 weeks (chronic).  CAUSES Acute cough:  Viral or bacterial infections. Chronic cough:  Infections.  Allergies.  Asthma.  Post-nasal drip.  Smoking.  Heartburn or acid reflux.  Some medicines.  Chronic lung problems (COPD).  Cancer. SYMPTOMS   Cough.  Fever.  Chest pain.  Increased breathing rate.  High-pitched whistling sound when breathing (wheezing).  Colored mucus that you cough up (sputum). TREATMENT   A bacterial cough may be treated with antibiotic medicine.  A viral cough must run its course and will not respond to antibiotics.  Your caregiver may recommend other treatments if you have a chronic cough. HOME CARE INSTRUCTIONS   Only take over-the-counter or prescription medicines for pain, discomfort, or fever as directed by your caregiver. Use cough suppressants only as directed by your caregiver.  Use a cold steam vaporizer or humidifier in your bedroom or home to help loosen secretions.  Sleep in a semi-upright position if your cough is worse at night.  Rest as needed.  Stop smoking if you smoke. SEEK IMMEDIATE MEDICAL CARE IF:   You have pus in your sputum.  Your cough starts to worsen.  You cannot control your cough with suppressants and are losing sleep.  You begin coughing up blood.  You have difficulty breathing.  You develop pain which is getting worse or is uncontrolled with medicine.  You have a fever. MAKE SURE YOU:   Understand these instructions.  Will watch your condition.  Will get help right away if you are not doing well or get worse. Document Released: 10/03/2010 Document Revised: 06/29/2011 Document Reviewed: 10/03/2010 [facility] Patient  Information 2014 [place], [place].

## 2012-09-15 NOTE — Progress Notes (Signed)
Patient here for cough which is causing him to have SOB Some swelling to his ankles

## 2012-09-16 NOTE — Progress Notes (Signed)
Subjective:     Patient ID: Steven Beasley, male   DOB: 12-11-1940, 72 y.o.   MRN: [id number]  HPI Pt here with increased coughing over the past 2 weeks. Initially thought was allergies but cough has continued. Mild chest tightness consistent with his asthma, worse at night and relieved by albuertol 3-4x/week.   Pt quit smoking severl months ago.   Continues to have sx of allergic rhinitis esp when outdoors. Clritin has not helped his nasal congestion.   Review of Systemsper hpi     Objective:   Physical Exam  Nursing note and vitals reviewed. Constitutional: He appears well-developed and well-nourished.  Cardiovascular: Normal rate and normal heart sounds.   Pulmonary/Chest: Effort normal.  Scattered wheezing  Psychiatric: He has a normal mood and affect.       Assessment:     Cough - Plan: albuterol (PROAIR HFA) 108 (90 BASE) MCG/ACT inhaler, albuterol (PROVENTIL) (2.5 MG/3ML) 0.083% nebulizer solution, doxycycline (VIBRAMYCIN) 100 MG capsule, fluticasone (FLONASE) 50 MCG/ACT nasal spray, ipratropium (ATROVENT) 0.02 % nebulizer solution       Plan:     meds as above. Add flonase for allergic rhinitis. Doxy for bronchitis. Not bad enough at this point for steroids but have told him that if his breathing worsens to please call or be seen so we can get him on prednisone. Hopefully this ownt be needed.    f/u 1 week - at that time we can reassess his lungs, also discuss his joint pain that he mentioned today and discuss refills that he says he is coming up on needing.

## 2012-09-22 ENCOUNTER — Other Ambulatory Visit (HOSPITAL_BASED_OUTPATIENT_CLINIC_OR_DEPARTMENT_OTHER): Payer: Medicare Other

## 2012-09-22 DIAGNOSIS — C9000 Multiple myeloma not having achieved remission: Secondary | ICD-10-CM

## 2012-09-22 LAB — COMPREHENSIVE METABOLIC PANEL (CC13)
ALT: 16 U/L (ref 0–55)
AST: 22 U/L (ref 5–34)
Albumin: 4 g/dL (ref 3.5–5.0)
CO2: 29 mEq/L (ref 22–29)
Calcium: 9.6 mg/dL (ref 8.4–10.4)
Chloride: 98 mEq/L (ref 98–107)
Potassium: 4.2 mEq/L (ref 3.5–5.1)

## 2012-09-22 LAB — CBC WITH DIFFERENTIAL/PLATELET
BASO%: 0.2 % (ref 0.0–2.0)
EOS%: 3.7 % (ref 0.0–7.0)
HCT: 38.3 % — ABNORMAL LOW (ref 38.4–49.9)
MCH: 31.9 pg (ref 27.2–33.4)
MCHC: 34.5 g/dL (ref 32.0–36.0)
NEUT%: 35.6 % — ABNORMAL LOW (ref 39.0–75.0)
RDW: 12.4 % (ref 11.0–14.6)
lymph#: 1.8 10*3/uL (ref 0.9–3.3)

## 2012-09-26 LAB — KAPPA/LAMBDA LIGHT CHAINS
Kappa:Lambda Ratio: 9.46 — ABNORMAL HIGH (ref 0.26–1.65)
Lambda Free Lght Chn: 0.5 mg/dL — ABNORMAL LOW (ref 0.57–2.63)

## 2012-09-26 LAB — PROTEIN ELECTROPHORESIS, SERUM
Alpha-1-Globulin: 5.9 % — ABNORMAL HIGH (ref 2.9–4.9)
Beta 2: 3.3 % (ref 3.2–6.5)
M-Spike, %: 0.99 g/dL

## 2012-09-29 ENCOUNTER — Encounter: Payer: Self-pay | Admitting: Oncology

## 2012-09-29 ENCOUNTER — Telehealth: Payer: Self-pay | Admitting: Oncology

## 2012-09-29 ENCOUNTER — Ambulatory Visit (HOSPITAL_BASED_OUTPATIENT_CLINIC_OR_DEPARTMENT_OTHER): Payer: Medicare Other | Admitting: Oncology

## 2012-09-29 VITALS — BP 131/84 | HR 98 | Temp 97.0°F | Resp 18 | Ht 70.0 in | Wt 161.1 lb

## 2012-09-29 DIAGNOSIS — I10 Essential (primary) hypertension: Secondary | ICD-10-CM

## 2012-09-29 DIAGNOSIS — C61 Malignant neoplasm of prostate: Secondary | ICD-10-CM

## 2012-09-29 DIAGNOSIS — D708 Other neutropenia: Secondary | ICD-10-CM

## 2012-09-29 DIAGNOSIS — C9000 Multiple myeloma not having achieved remission: Secondary | ICD-10-CM

## 2012-09-29 DIAGNOSIS — E8809 Other disorders of plasma-protein metabolism, not elsewhere classified: Secondary | ICD-10-CM

## 2012-09-29 NOTE — Progress Notes (Signed)
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 Presenting sign was mild anemia with Hgb of 11.8. Work up showed M-spike of 0.88. BM biopsy showed 8% plasma cell. Skeletal survey showed no lytic lesion.   CURRENT THERAPY: watchful observation  INTERVAL HISTORY: Steven Beasley 72 y.o. male returns for regular follow up.  He reports doing well.  He has mild chest congestion with productive cough with clear phlegm.  He denied fever, chest pain, hemoptysis.  He denied SOB, DOE, bleeding, skin rash, bone pain. He still works as a Special educational needs teacher.   The rest of the 14-point review of system was negative.   Past Medical History  Diagnosis Date  . Hypertension   . Leukocytopenia   . Asthma   . Abdominal pain, chronic, generalized   . GERD (gastroesophageal reflux disease)   . BPH (benign prostatic hyperplasia)   . 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 No pertinent past surgical history.  Current Outpatient Prescriptions  Medication Sig Dispense Refill  . albuterol (PROAIR HFA) 108 (90 BASE) MCG/ACT inhaler Inhale 1-2 puffs into the lungs every 4 (four) hours as needed for wheezing or shortness of breath. For asthma.  18 g  5  . albuterol (PROVENTIL) (2.5 MG/3ML) 0.083% nebulizer solution Take 3 mLs (2.5 mg total) by nebulization every 4 (four) hours as needed for wheezing or shortness of breath.  75 mL  12  . amLODipine (NORVASC) 10 MG tablet Take 1 tablet (10 mg total) by mouth daily.  30 tablet  4  . esomeprazole (NEXIUM) 40 MG capsule Take 40 mg by mouth daily before breakfast.        . fluticasone (FLONASE) 50 MCG/ACT nasal spray Place 2 sprays into the nose daily.  16 g  5  . gabapentin (NEURONTIN) 300 MG capsule Take 300 mg by mouth 3 (three) times daily.        Marland Kitchen ipratropium (ATROVENT) 0.02 % nebulizer solution Take 2.5 mLs  (500 mcg total) by nebulization every 4 (four) hours as needed for wheezing (shortness of breath).  75 mL  12  . pentosan polysulfate (ELMIRON) 100 MG capsule Take 100 mg by mouth 3 (three) times daily before meals.       . Tamsulosin HCl (FLOMAX) 0.4 MG CAPS Take 0.4 mg by mouth daily.       Marland Kitchen venlafaxine (EFFEXOR-XR) 150 MG 24 hr capsule Take 150 mg by mouth daily.         No current facility-administered medications for this visit.    ALLERGIES:  has No Known Allergies.  REVIEW OF SYSTEMS:  The rest of the 14-point review of system was negative.   Filed Vitals:   09/29/12 0938  BP: 131/84  Pulse: 98  Temp: 97 F (36.1 C)  Resp: 18   Wt Readings from Last 3 Encounters:  09/29/12 161 lb 1.6 oz (73.074 kg)  09/15/12 168 lb (76.204 kg)  03/24/12 171 lb 8 oz (77.792 kg)   ECOG Performance status: 0  PHYSICAL EXAMINATION:   General: well-nourished man in no acute distress. Eyes: no scleral icterus. ENT: There were no oropharyngeal lesions. Neck was without thyromegaly. Lymphatics: Negative cervical, supraclavicular or axillary adenopathy. Respiratory: lungs were clear bilaterally without wheezing or crackles. Cardiovascular: Regular rate and rhythm, S1/S2, without murmur, rub or gallop. There was no pedal edema. GI: abdomen was  soft, flat, nontender, nondistended, without organomegaly. Muscoloskeletal: no spinal tenderness of palpation of vertebral spine. There was no pain in bilateral hip or iliac with passive movement of his legs. Skin exam was without echymosis, petichae. Neuro exam was nonfocal. Patient was able to get on and off exam table without assistance. Gait was normal. Patient was alerted and oriented. Attention was good. Language was appropriate. Mood was normal without depression. Speech was not pressured. Thought content was not tangential.     LABORATORY/RADIOLOGY DATA:  Lab Results  Component Value Date   WBC 4.1 09/22/2012   HGB 13.2 09/22/2012   HCT 38.3* 09/22/2012     PLT 197 09/22/2012   GLUCOSE 100* 09/22/2012   CHOL 122 04/05/2007   TRIG 73 04/05/2007   HDL 53 04/05/2007   LDLCALC 54 04/05/2007   ALKPHOS 68 09/22/2012   ALT 16 09/22/2012   AST 22 09/22/2012   NA 136 09/22/2012   K 4.2 09/22/2012   CL 98 09/22/2012   CREATININE 1.1 09/22/2012   BUN 9.6 09/22/2012   CO2 29 09/22/2012   PSA 1.92 08/20/2009   INR 0.91 10/21/2010   SPEP:  M-spike stable at 0.99  ASSESSMENT AND PLAN:   1. History of prostate cancer, status post radiation therapy between December 2010 and February 2011. He is NED and he follows with Dr Dayton Scrape and Dr Reche Dixon.   2. Hypertension: He is on amlodipine per PCP with good control.   3. Benign prostatic hypertrophy: He is on tamsulosin per PCP.   4. Chronic leukocytopenia: mild; stable.  Most likely normal variation.  There was no myeloproliferative disease on last bone marrow biopsy.   5. Smoldering myeloma: - Bone marrow biopsy from November 2013 showed slight increase of bone marrow plasmacytosis from 8% to 13%.  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  - His hypercalcemia has been intermittent.  He does not have anemia, renal failure, nor lytic bone lesion last year.  Thus, I think that his plasma cell dyscrasia is still smoldering instead of being active. - I recommended again watchful observation.  Mr. Farrior was OK with observation.  We will follow his lab in 3 months and visit in 6 months. 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   6.  Seasonal allergy:  OTC meds prn.        The length of time of the face-to-face encounter was 15 minutes. More than 50% of time was spent counseling and coordination of care.

## 2012-09-29 NOTE — Patient Instructions (Addendum)
You may use Mucinex 1 tablet twice a day for cough/congestion. If you develop fevers and wheezing, please follow-up with your primary care provider for treatment.

## 2012-10-26 ENCOUNTER — Ambulatory Visit: Payer: Medicare Other | Attending: Family Medicine | Admitting: Internal Medicine

## 2012-10-26 ENCOUNTER — Inpatient Hospital Stay (HOSPITAL_COMMUNITY)
Admission: EM | Admit: 2012-10-26 | Discharge: 2012-10-29 | DRG: 194 | Disposition: A | Discharge status: Home / Self Care | Payer: Medicare Other | Attending: Internal Medicine | Admitting: Internal Medicine

## 2012-10-26 ENCOUNTER — Encounter: Payer: Self-pay | Admitting: Internal Medicine

## 2012-10-26 ENCOUNTER — Encounter (HOSPITAL_COMMUNITY): Payer: Self-pay

## 2012-10-26 ENCOUNTER — Emergency Department (HOSPITAL_COMMUNITY): Payer: Medicare Other

## 2012-10-26 VITALS — BP 141/79 | HR 116 | Temp 98.7°F | Resp 18 | Ht 73.0 in | Wt 162.0 lb

## 2012-10-26 DIAGNOSIS — K219 Gastro-esophageal reflux disease without esophagitis: Secondary | ICD-10-CM | POA: Diagnosis present

## 2012-10-26 DIAGNOSIS — N400 Enlarged prostate without lower urinary tract symptoms: Secondary | ICD-10-CM | POA: Diagnosis present

## 2012-10-26 DIAGNOSIS — R636 Underweight: Secondary | ICD-10-CM

## 2012-10-26 DIAGNOSIS — J449 Chronic obstructive pulmonary disease, unspecified: Secondary | ICD-10-CM | POA: Diagnosis present

## 2012-10-26 DIAGNOSIS — R1013 Epigastric pain: Secondary | ICD-10-CM

## 2012-10-26 DIAGNOSIS — R58 Hemorrhage, not elsewhere classified: Secondary | ICD-10-CM | POA: Diagnosis present

## 2012-10-26 DIAGNOSIS — D72819 Decreased white blood cell count, unspecified: Secondary | ICD-10-CM

## 2012-10-26 DIAGNOSIS — Z87891 Personal history of nicotine dependence: Secondary | ICD-10-CM

## 2012-10-26 DIAGNOSIS — R918 Other nonspecific abnormal finding of lung field: Secondary | ICD-10-CM

## 2012-10-26 DIAGNOSIS — C9000 Multiple myeloma not having achieved remission: Secondary | ICD-10-CM | POA: Diagnosis present

## 2012-10-26 DIAGNOSIS — R042 Hemoptysis: Secondary | ICD-10-CM

## 2012-10-26 DIAGNOSIS — J189 Pneumonia, unspecified organism: Principal | ICD-10-CM

## 2012-10-26 DIAGNOSIS — R05 Cough: Secondary | ICD-10-CM

## 2012-10-26 DIAGNOSIS — J4489 Other specified chronic obstructive pulmonary disease: Secondary | ICD-10-CM | POA: Diagnosis present

## 2012-10-26 DIAGNOSIS — I10 Essential (primary) hypertension: Secondary | ICD-10-CM | POA: Diagnosis present

## 2012-10-26 HISTORY — DX: Shortness of breath: R06.02

## 2012-10-26 LAB — POCT I-STAT, CHEM 8
Calcium, Ion: 1.09 mmol/L — ABNORMAL LOW (ref 1.13–1.30)
Chloride: 93 mEq/L — ABNORMAL LOW (ref 96–112)
HCT: 33 % — ABNORMAL LOW (ref 39.0–52.0)
Potassium: 3.9 mEq/L (ref 3.5–5.1)
Sodium: 133 mEq/L — ABNORMAL LOW (ref 135–145)

## 2012-10-26 LAB — CBC WITH DIFFERENTIAL/PLATELET
Basophils Absolute: 0 10*3/uL (ref 0.0–0.1)
Basophils Relative: 0 % (ref 0–1)
MCHC: 34.9 g/dL (ref 30.0–36.0)
Neutro Abs: 4.5 10*3/uL (ref 1.7–7.7)
Neutrophils Relative %: 60 % (ref 43–77)
Platelets: 212 10*3/uL (ref 150–400)
RDW: 12.6 % (ref 11.5–15.5)
WBC: 7.5 10*3/uL (ref 4.0–10.5)

## 2012-10-26 MED ORDER — BENZONATATE 100 MG PO CAPS
200.0000 mg | ORAL_CAPSULE | Freq: Three times a day (TID) | ORAL | Status: DC | PRN
  Administered 2012-10-27: 200 mg via ORAL
  Filled 2012-10-26 (×3): qty 2

## 2012-10-26 MED ORDER — AMLODIPINE BESYLATE 10 MG PO TABS
10.0000 mg | ORAL_TABLET | Freq: Every day | ORAL | Status: DC
  Administered 2012-10-27 – 2012-10-29 (×3): 10 mg via ORAL
  Filled 2012-10-26 (×3): qty 1

## 2012-10-26 MED ORDER — GABAPENTIN 300 MG PO CAPS
300.0000 mg | ORAL_CAPSULE | Freq: Three times a day (TID) | ORAL | Status: DC
  Administered 2012-10-27 – 2012-10-29 (×8): 300 mg via ORAL
  Filled 2012-10-26 (×10): qty 1

## 2012-10-26 MED ORDER — DEXTROSE 5 % IV SOLN
500.0000 mg | Freq: Once | INTRAVENOUS | Status: AC
  Administered 2012-10-26: 500 mg via INTRAVENOUS
  Filled 2012-10-26: qty 500

## 2012-10-26 MED ORDER — SODIUM CHLORIDE 0.9 % IJ SOLN
3.0000 mL | Freq: Two times a day (BID) | INTRAMUSCULAR | Status: DC
  Administered 2012-10-27 – 2012-10-28 (×4): 3 mL via INTRAVENOUS

## 2012-10-26 MED ORDER — PENTOSAN POLYSULFATE SODIUM 100 MG PO CAPS
100.0000 mg | ORAL_CAPSULE | Freq: Three times a day (TID) | ORAL | Status: DC
  Administered 2012-10-27 – 2012-10-29 (×6): 100 mg via ORAL
  Filled 2012-10-26 (×10): qty 1

## 2012-10-26 MED ORDER — VENLAFAXINE HCL ER 150 MG PO CP24
150.0000 mg | ORAL_CAPSULE | Freq: Every day | ORAL | Status: DC
  Administered 2012-10-27 – 2012-10-29 (×3): 150 mg via ORAL
  Filled 2012-10-26 (×3): qty 1

## 2012-10-26 MED ORDER — PANTOPRAZOLE SODIUM 40 MG PO TBEC: 40.0000 mg | DELAYED_RELEASE_TABLET | Freq: Every day | ORAL | Status: DC

## 2012-10-26 MED ORDER — ALBUTEROL SULFATE (5 MG/ML) 0.5% IN NEBU: 2.5000 mg | INHALATION_SOLUTION | RESPIRATORY_TRACT | Status: DC | PRN

## 2012-10-26 MED ORDER — IPRATROPIUM BROMIDE 0.02 % IN SOLN: 500.0000 ug | RESPIRATORY_TRACT | Status: DC | PRN

## 2012-10-26 MED ORDER — IPRATROPIUM BROMIDE 0.02 % IN SOLN
0.5000 mg | Freq: Once | RESPIRATORY_TRACT | Status: AC
  Administered 2012-10-26: 0.5 mg via RESPIRATORY_TRACT
  Filled 2012-10-26: qty 2.5

## 2012-10-26 MED ORDER — DEXTROSE 5 % IV SOLN
1.0000 g | Freq: Once | INTRAVENOUS | Status: AC
  Administered 2012-10-26: 1 g via INTRAVENOUS
  Filled 2012-10-26: qty 10

## 2012-10-26 MED ORDER — ALBUTEROL SULFATE HFA 108 (90 BASE) MCG/ACT IN AERS: 2.0000 | INHALATION_SPRAY | Freq: Four times a day (QID) | RESPIRATORY_TRACT | Status: DC | PRN

## 2012-10-26 MED ORDER — SODIUM CHLORIDE 0.9 % IV SOLN
INTRAVENOUS | Status: DC
  Administered 2012-10-27 (×2): via INTRAVENOUS
  Administered 2012-10-28: 100 mL/h via INTRAVENOUS

## 2012-10-26 MED ORDER — TAMSULOSIN HCL 0.4 MG PO CAPS
0.4000 mg | ORAL_CAPSULE | Freq: Every day | ORAL | Status: DC
  Administered 2012-10-27 – 2012-10-29 (×3): 0.4 mg via ORAL
  Filled 2012-10-26 (×3): qty 1

## 2012-10-26 MED ORDER — ALBUTEROL SULFATE (5 MG/ML) 0.5% IN NEBU
5.0000 mg | INHALATION_SOLUTION | Freq: Once | RESPIRATORY_TRACT | Status: AC
  Administered 2012-10-26: 5 mg via RESPIRATORY_TRACT
  Filled 2012-10-26: qty 1

## 2012-10-26 MED ORDER — FLUTICASONE PROPIONATE 50 MCG/ACT NA SUSP
2.0000 | Freq: Every day | NASAL | Status: DC
  Administered 2012-10-27 – 2012-10-29 (×3): 2 via NASAL
  Filled 2012-10-26: qty 16

## 2012-10-26 MED ORDER — PANTOPRAZOLE SODIUM 40 MG PO TBEC
80.0000 mg | DELAYED_RELEASE_TABLET | Freq: Every day | ORAL | Status: DC
  Administered 2012-10-27 – 2012-10-28 (×2): 80 mg via ORAL
  Filled 2012-10-26 (×3): qty 2

## 2012-10-26 NOTE — Progress Notes (Signed)
Patient ID: Steven Beasley, male   DOB: 1940-05-26, 72 y.o.   MRN: [id number] Patient Demographics  Steven Beasley, is a 72 y.o. male  WUJ:[id number]  [id number]:[id number]  DOB - 20-May-1940  Chief Complaint  Patient presents with  . Cough    bloody sputum        Subjective:   Steven Beasley with History of early myeloma, GERD, lung nodule noted on x-ray few years ago  is here for a cough which has been there for a few weeks however in the last 24 hours he has noticed that his cough is now productive of bloody sputum, he says is largely blood and not blood treat sputum, and eyes any fever chills or shortness of breath, no exposure to tuberculosis, no recent unintentional weight loss, no night sweats, good appetite. Also complains of mild heartburn.    Objective:    Patient Active Problem List   Diagnosis Date Noted  . Hemoptysis 10/26/2012  . 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 Leukocytopenia   . Asthma   . Abdominal pain, chronic, generalized   . GERD (gastroesophageal reflux disease)   . BPH (benign prostatic hyperplasia)   . DYSPEPSIA&OTHER [facility] DISORDERS FUNCTION STOMACH 10/01/2009  . UNDERWEIGHT 10/01/2009     Filed Vitals:   10/26/12 1638  BP: 141/79  Pulse: 116  Temp: 98.7 F (37.1 C)  TempSrc: Oral  Resp: 18  Height: 6\' 1"  (1.854 m)  Weight: 162 lb (73.483 kg)  SpO2: 99%     Exam  Awake Alert, Oriented X 3, No new F.N deficits, Normal affect [place].AT,PERRAL Supple Neck,No JVD, No cervical lymphadenopathy appriciated.  Symmetrical Chest wall movement, Good air movement bilaterally, CTAB RRR,No Gallops,Rubs or new Murmurs, No Parasternal Heave +ve B.Sounds, Abd Soft, Non tender, No organomegaly appriciated, No rebound - guarding or rigidity. No Cyanosis, Clubbing or edema, No new Rash or bruise     Data Review   CBC No results found for this basename: WBC, HGB, HCT, PLT, MCV, MCH, MCHC, RDW, NEUTRABS, LYMPHSABS, MONOABS, EOSABS, BASOSABS, BANDABS,  BANDSABD,  in the last 168 hours  Chemistries   No results found for this basename: NA, K, CL, CO2, GLUCOSE, BUN, CREATININE, GFRCGP, CALCIUM, MG, AST, ALT, ALKPHOS, BILITOT,  in the last 168 hours ------------------------------------------------------------------------------------------------------------------ No results found for this basename: HGBA1C,  in the last 72 hours ------------------------------------------------------------------------------------------------------------------ No results found for this basename: CHOL, HDL, LDLCALC, TRIG, CHOLHDL, LDLDIRECT,  in the last 72 hours ------------------------------------------------------------------------------------------------------------------ No results found for this basename: TSH, T4TOTAL, FREET3, T3FREE, THYROIDAB,  in the last 72 hours ------------------------------------------------------------------------------------------------------------------ No results found for this basename: VITAMINB12, FOLATE, FERRITIN, TIBC, IRON, RETICCTPCT,  in the last 72 hours  Coagulation profile  No results found for this basename: INR, PROTIME,  in the last 168 hours     Prior to Admission medications   Medication Sig Start Date End Date Taking? Authorizing Provider  albuterol (PROAIR HFA) 108 (90 BASE) MCG/ACT inhaler Inhale 1-2 puffs into the lungs every 4 (four) hours as needed for wheezing or shortness of breath. For asthma. 09/15/12   Acey Lav, MD  albuterol (PROVENTIL) (2.5 MG/3ML) 0.083% nebulizer solution Take 3 mLs (2.5 mg total) by nebulization every 4 (four) hours as needed for wheezing or shortness of breath. 09/15/12   Acey Lav, MD  amLODipine (NORVASC) 10 MG tablet Take 1 tablet (10 mg total) by mouth daily. 05/30/12   Clanford Cyndie Mull, MD  esomeprazole (NEXIUM) 40 MG capsule Take 40 mg by mouth  daily before breakfast.      Historical Provider, MD  fluticasone (FLONASE) 50 MCG/ACT nasal spray Place 2 sprays into the  nose daily. 09/15/12   Acey Lav, MD  gabapentin (NEURONTIN) 300 MG capsule Take 300 mg by mouth 3 (three) times daily.      Historical Provider, MD  ipratropium (ATROVENT) 0.02 % nebulizer solution Take 2.5 mLs (500 mcg total) by nebulization every 4 (four) hours as needed for wheezing (shortness of breath). 09/15/12   Acey Lav, MD  pentosan polysulfate (ELMIRON) 100 MG capsule Take 100 mg by mouth 3 (three) times daily before meals.     Historical Provider, MD  Tamsulosin HCl (FLOMAX) 0.4 MG CAPS Take 0.4 mg by mouth daily.     Historical Provider, MD  venlafaxine (EFFEXOR-XR) 150 MG 24 hr capsule Take 150 mg by mouth daily.      Historical Provider, MD     Assessment & Plan    1. Urgent visit for hemoptysis which has been ongoing for about 12-16 hours, patient's chart reviewed he carries a diagnosis of low-grade bone marrow dysplasia/myeloma, history of lung nodule which was noted on the chest x-ray few years ago. He will be at this point transfer to the ER, he will require a two-view chest x-ray and most likely a CT scan of the chest along with routine blood work including INR. I would strongly urge that a pulmonary consult be obtained.   2. Symptoms history of reflux. We'll place him on PPI.  Leroy Sea M.D on 10/26/2012 at 4:42 PM

## 2012-10-26 NOTE — Progress Notes (Signed)
Pt comes in with c/o progressive cough that started with clear sputum but has changed yesterday with hemoptysis. Sob noted. vss

## 2012-10-26 NOTE — ED Notes (Signed)
Pt reports having a productive cough with white colored sputum on and off x1 year. Pt began coughing blood today and feeling nauseated today. Pt seen at [facility] and sent here for further evaluation. Pt denies chest pain.

## 2012-10-26 NOTE — ED Provider Notes (Signed)
History    CSN: [id number] Arrival date & time 10/26/12  1659  First MD Initiated Contact with Patient 10/26/12 1944     Chief Complaint  Patient presents with  . Hemoptysis    Patient is a 72 y.o. male presenting with cough. The history is provided by the patient.  Cough Cough characteristics:  Productive Severity:  Moderate Onset quality:  Gradual Duration: "several months ago" Timing:  Intermittent Progression:  Worsening Chronicity:  Recurrent Smoker: former smoker.   Relieved by:  Nothing Worsened by:  Nothing tried Associated symptoms: chills, shortness of breath and wheezing   Associated symptoms: no chest pain   pt reports he has had cough for "awhile" but at least several months He reports that starting yesterday while coughing he has coughed up blood.  He reports usually his sputum is clear, now it is bloody No recorded fever No CP No syncope No vomiting/diarrhea.  No rectal bleeding or hematemesis reported Past Medical History  Diagnosis Date  . Hypertension   . Leukocytopenia   . Asthma   . Abdominal pain, chronic, generalized   . GERD (gastroesophageal reflux disease)   . BPH (benign prostatic hyperplasia)   . 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 No pertinent past surgical history. History reviewed. No pertinent family history. History  Substance Use Topics  . Smoking status: Former Games developer  . Smokeless tobacco: Not on file  . Alcohol Use:     Review of Systems  Constitutional: Positive for chills.  Respiratory: Positive for cough, shortness of breath and wheezing.   Cardiovascular: Negative for chest pain.  All other systems reviewed and are negative.    Allergies  Review of patient's allergies indicates no known allergies.  Home Medications   Current Outpatient Rx  Name  Route  Sig  Dispense  Refill  . albuterol (PROVENTIL HFA;VENTOLIN HFA) 108 (90 BASE) MCG/ACT inhaler   Inhalation   Inhale 2 puffs into the lungs every 6  (six) hours as needed for wheezing.         Marland Kitchen albuterol (PROAIR HFA) 108 (90 BASE) MCG/ACT inhaler   Inhalation   Inhale 1-2 puffs into the lungs every 4 (four) hours as needed for wheezing or shortness of breath. For asthma.   18 g   5   . albuterol (PROVENTIL) (2.5 MG/3ML) 0.083% nebulizer solution   Nebulization   Take 3 mLs (2.5 mg total) by nebulization every 4 (four) hours as needed for wheezing or shortness of breath.   75 mL   12   . amLODipine (NORVASC) 10 MG tablet   Oral   Take 1 tablet (10 mg total) by mouth daily.   30 tablet   4   . esomeprazole (NEXIUM) 40 MG capsule   Oral   Take 40 mg by mouth daily before breakfast.           . fluticasone (FLONASE) 50 MCG/ACT nasal spray   Nasal   Place 2 sprays into the nose daily.   16 g   5   . gabapentin (NEURONTIN) 300 MG capsule   Oral   Take 300 mg by mouth 3 (three) times daily.           Marland Kitchen ipratropium (ATROVENT) 0.02 % nebulizer solution   Nebulization   Take 2.5 mLs (500 mcg total) by nebulization every 4 (four) hours as needed for wheezing (shortness of breath).   75 mL   12   . pantoprazole (PROTONIX) 40 MG tablet  Oral   Take 1 tablet (40 mg total) by mouth daily.   30 tablet   3   . pentosan polysulfate (ELMIRON) 100 MG capsule   Oral   Take 100 mg by mouth 3 (three) times daily before meals.          . Tamsulosin HCl (FLOMAX) 0.4 MG CAPS   Oral   Take 0.4 mg by mouth daily.          Marland Kitchen venlafaxine (EFFEXOR-XR) 150 MG 24 hr capsule   Oral   Take 150 mg by mouth daily.            BP 136/70  Pulse 71  Temp(Src) 98.4 F (36.9 C) (Oral)  Resp 16  SpO2 97%   Physical Exam CONSTITUTIONAL: Well developed/well nourished HEAD: Normocephalic/atraumatic EYES: EOMI ENMT: Mucous membranes moist NECK: supple no meningeal signs SPINE:entire spine nontender CV: S1/S2 noted, no murmurs/rubs/gallops noted LUNGS: scattered wheezing noted throughout with decreased BS noted in the  bases.  He is able to speak to me clearly ABDOMEN: soft, nontender, no rebound or guarding NEURO: Pt is awake/alert, moves all extremitiesx4 EXTREMITIES: pulses normal, full ROM SKIN: warm, color normal PSYCH: no abnormalities of mood noted  ED Course  Procedures  Labs Reviewed  CBC WITH DIFFERENTIAL - Abnormal; Notable for the following:    RBC 3.26 (*)    Hemoglobin 10.4 (*)    HCT 29.8 (*)    Monocytes Relative 16 (*)    Monocytes Absolute 1.2 (*)    All other components within normal limits  POCT I-STAT, CHEM 8 - Abnormal; Notable for the following:    Sodium 133 (*)    Chloride 93 (*)    Calcium, Ion 1.09 (*)    Hemoglobin 11.2 (*)    HCT 33.0 (*)    All other components within normal limits   Dg Chest 2 View  10/26/2012   *RADIOLOGY REPORT*  Clinical Data: Hemoptysis, chronic cough for 1 year, start coughing up blood yesterday, history hypertension, asthma, GERD, myeloma  CHEST - 2 VIEW  Comparison: 09/05/2009  Findings: Normal heart size and pulmonary vascularity. Tortuous aorta with atherosclerotic calcification. Emphysematous changes consistent with COPD. Extensive right middle lobe infiltrate could represent pneumonia or pulmonary hemorrhage. Small right pleural effusion. Left nipple shadow corresponding to nipple marker on previous study. No pneumothorax or acute osseous findings. Scattered end plate spur formation thoracic spine.  IMPRESSION: COPD with extensive consolidation within the right middle lobe question pneumonia versus pulmonary hemorrhage. Small right pleural effusion.   Original Report Authenticated By: Ulyses Southward, M.D.  8:19 PM Pt reporting chronic cough now with hemoptysis Xray suggestive of pneumonia, though may also have underlying mass that is not seen on this image Will start antibiotics for community acquired pneumonia and will need admission as he is high risk for outpatient failure  Pt agreeable with plan 9:43 PM D/w dr gardner Will admit Pt may  need CT chest, but does not require this emergently at this time and can have done as inpatient (less likely PE at this time)  MDM  Nursing notes including past medical history and social history reviewed and considered in documentation Labs/vital reviewed and considered Previous records reviewed and considered - h/o myeloma, not on chemo xrays reviewed and considered    Date: 10/26/2012  Rate: 116  Rhythm: sinus tachycardia  QRS Axis: normal  Intervals: normal  ST/T Wave abnormalities: nonspecific ST changes  Conduction Disutrbances:right bundle branch block  Narrative Interpretation: PVCs  noted  Old EKG Reviewed: changes noted PVCs noted on today's ekg      Joya Gaskins, MD 10/26/12 2144

## 2012-10-26 NOTE — H&P (Addendum)
[facility] History and Physical  AMEN STASZAK AVW:[id number] DOB: 1940-08-28 DOA: 10/26/2012  Referring physician: ED PCP: [name], MD   Chief Complaint: Hemoptysis  HPI: Steven Beasley is a 72 y.o. male History of smoldering myeloma, GERD, lung nodule noted on x-ray few years ago (4 pulm nodules found on CT chest 05/28/10) is here for a cough which has been there for a few weeks, initially productive of clear sputum; however, in the last 24 hours he has noticed that his cough is now productive of bloody sputum, he says is largely blood and not blood tinged sputum, and denies any fever chills or shortness of breath, no exposure to tuberculosis, no recent unintentional weight loss, no night sweats, good appetite. Also complains of mild heartburn.  He was initially seen by Dr. Thedore Mins at [facility] and sent over to the ED for further work up, in the ED his hemoglobin was somewhat decreased initially at 10.4 / 11.2 compared to 13.2 a month ago.  Thankfully his hemoptysis has resolved on its own at this point.  CXR did show area of infiltrate believed to be related to pulmonary hemorrhage in the RML, hospitalist has been asked to admit for further work up.  Review of Systems: 12 systems reviewed and otherwise negative.  Past Medical History  Diagnosis Date  . Hypertension   . Leukocytopenia   . Asthma   . Abdominal pain, chronic, generalized   . GERD (gastroesophageal reflux disease)   . BPH (benign prostatic hyperplasia)   . 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 No pertinent past surgical history. Social History:  reports that he has quit smoking. He does not have any smokeless tobacco history on file. His alcohol and drug histories are not on file.   No Known Allergies  History reviewed. No pertinent family history.   Prior to Admission medications   Medication Sig Start Date End Date Taking? Authorizing Provider  albuterol (PROVENTIL HFA;VENTOLIN HFA) 108 (90  BASE) MCG/ACT inhaler Inhale 2 puffs into the lungs every 6 (six) hours as needed for wheezing.   Yes Historical Provider, MD  amLODipine (NORVASC) 10 MG tablet Take 1 tablet (10 mg total) by mouth daily. 05/30/12  Yes Clanford Cyndie Mull, MD  esomeprazole (NEXIUM) 40 MG capsule Take 40 mg by mouth daily before breakfast.     Yes Historical Provider, MD  fluticasone (FLONASE) 50 MCG/ACT nasal spray Place 2 sprays into the nose daily. 09/15/12  Yes Acey Lav, MD  gabapentin (NEURONTIN) 300 MG capsule Take 300 mg by mouth 3 (three) times daily.     Yes Historical Provider, MD  pantoprazole (PROTONIX) 40 MG tablet Take 1 tablet (40 mg total) by mouth daily. 10/26/12  Yes Leroy Sea, MD  pentosan polysulfate (ELMIRON) 100 MG capsule Take 100 mg by mouth 3 (three) times daily before meals.    Yes Historical Provider, MD  Tamsulosin HCl (FLOMAX) 0.4 MG CAPS Take 0.4 mg by mouth daily.    Yes Historical Provider, MD  venlafaxine (EFFEXOR-XR) 150 MG 24 hr capsule Take 150 mg by mouth daily.     Yes Historical Provider, MD  albuterol (PROVENTIL) (2.5 MG/3ML) 0.083% nebulizer solution Take 3 mLs (2.5 mg total) by nebulization every 4 (four) hours as needed for wheezing or shortness of breath. 09/15/12   Acey Lav, MD  ipratropium (ATROVENT) 0.02 % nebulizer solution Take 2.5 mLs (500 mcg total) by nebulization every 4 (four) hours as needed for wheezing (shortness of breath).  09/15/12   Acey Lav, MD   Physical Exam: Filed Vitals:   10/26/12 1725 10/26/12 1938 10/26/12 2021  BP: 139/81 136/70   Pulse: 105 71 107  Temp: 98.4 F (36.9 C)    TempSrc: Oral    Resp: 18 16 16   SpO2: 99% 97% 91%    General:  NAD, resting comfortably in bed, thin appearing Eyes: PEERLA EOMI ENT: mucous membranes moist Neck: supple w/o JVD Cardiovascular: RRR w/o MRG Respiratory: CTA B Abdomen: soft, nt, nd, bs+ Skin: no rash nor lesion Musculoskeletal: MAE, full ROM all 4 extremities Psychiatric: normal  tone and affect Neurologic: AAOx3, grossly non-focal  Labs on Admission:  Basic Metabolic Panel:  Recent Labs Lab 10/26/12 1803  NA 133*  K 3.9  CL 93*  GLUCOSE 99  BUN 17  CREATININE 1.20   Liver Function Tests: No results found for this basename: AST, ALT, ALKPHOS, BILITOT, PROT, ALBUMIN,  in the last 168 hours No results found for this basename: LIPASE, AMYLASE,  in the last 168 hours No results found for this basename: AMMONIA,  in the last 168 hours CBC:  Recent Labs Lab 10/26/12 1729 10/26/12 1803  WBC 7.5  --   NEUTROABS 4.5  --   HGB 10.4* 11.2*  HCT 29.8* 33.0*  MCV 91.4  --   PLT 212  --    Cardiac Enzymes: No results found for this basename: CKTOTAL, CKMB, CKMBINDEX, TROPONINI,  in the last 168 hours  BNP (last 3 results) No results found for this basename: PROBNP,  in the last 8760 hours CBG: No results found for this basename: GLUCAP,  in the last 168 hours  Radiological Exams on Admission: Dg Chest 2 View  10/26/2012   *RADIOLOGY REPORT*  Clinical Data: Hemoptysis, chronic cough for 1 year, start coughing up blood yesterday, history hypertension, asthma, GERD, myeloma  CHEST - 2 VIEW  Comparison: 09/05/2009  Findings: Normal heart size and pulmonary vascularity. Tortuous aorta with atherosclerotic calcification. Emphysematous changes consistent with COPD. Extensive right middle lobe infiltrate could represent pneumonia or pulmonary hemorrhage. Small right pleural effusion. Left nipple shadow corresponding to nipple marker on previous study. No pneumothorax or acute osseous findings. Scattered end plate spur formation thoracic spine.  IMPRESSION: COPD with extensive consolidation within the right middle lobe question pneumonia versus pulmonary hemorrhage. Small right pleural effusion.   Original Report Authenticated By: Ulyses Southward, M.D.    EKG: Independently reviewed.  Assessment/Plan Principal Problem:   Hemoptysis Active Problems:   Smoldering  myeloma   Pulmonary nodules/lesions, multiple   1. Hemoptysis - worrisome for possible lung malignancy given the history of smoking, COPD, known malignancy (smoldering myeloma), and at least 2 of the 4 pulmonary nodules seen on CT scan of chest are in the RML area (same area as apparent hemorrhage).  CT chest ordered and likely needs pulm consult re-bronchoscopy.  Got rocephin and azithromycin in ED x1 dose but do not plan on continuing these (PNA possible but seems less likely), given the lack of SIRS criteria, chronic cough symptoms, lack of dyspnea.  Symptomatic relief from coughing with PRN Tessalon, repeat HGB in AM but no indications for transfusion at this time and he has stopped coughing up blood on his own at this time he states.  Keeping patient NPO after midnight.    Code Status: Full Code (must indicate code status--if unknown or must be presumed, indicate so) Family Communication: No family in room (indicate person spoken with, if applicable, with phone  number if by telephone) Disposition Plan: Admit to inpatient (indicate anticipated LOS)  Time spent: 70 min  Jorey Dollard M. [facility] Pager [phone]  If 7PM-7AM, please contact night-coverage [email or web address] Password [facility] 10/26/2012, 10:38 PM

## 2012-10-26 NOTE — ED Notes (Signed)
Patient states that he has had a cold and cough for a while.  States that he has bee coughing out white phlegm.  States that the phlegm turned dark red yesterday evening. Phlegm is red each time he coughs.

## 2012-10-27 ENCOUNTER — Inpatient Hospital Stay (HOSPITAL_COMMUNITY): Payer: Medicare Other

## 2012-10-27 ENCOUNTER — Encounter (HOSPITAL_COMMUNITY): Payer: Self-pay | Admitting: Radiology

## 2012-10-27 DIAGNOSIS — I10 Essential (primary) hypertension: Secondary | ICD-10-CM

## 2012-10-27 LAB — BASIC METABOLIC PANEL
BUN: 13 mg/dL (ref 6–23)
CO2: 29 mEq/L (ref 19–32)
Chloride: 99 mEq/L (ref 96–112)
Creatinine, Ser: 0.87 mg/dL (ref 0.50–1.35)
GFR calc Af Amer: >90 mL/min (ref >90)
Glucose, Bld: 105 mg/dL — ABNORMAL HIGH (ref 70–99)

## 2012-10-27 LAB — CBC
HCT: 29.2 % — ABNORMAL LOW (ref 39.0–52.0)
Hemoglobin: 10.3 g/dL — ABNORMAL LOW (ref 13.0–17.0)
MCV: 91.8 fL (ref 78.0–100.0)
RDW: 12.7 % (ref 11.5–15.5)
WBC: 5.3 10*3/uL (ref 4.0–10.5)

## 2012-10-27 MED ORDER — IPRATROPIUM BROMIDE 0.02 % IN SOLN
0.5000 mg | Freq: Four times a day (QID) | RESPIRATORY_TRACT | Status: DC
  Administered 2012-10-27 – 2012-10-29 (×7): 0.5 mg via RESPIRATORY_TRACT
  Filled 2012-10-27 (×7): qty 2.5

## 2012-10-27 MED ORDER — IOHEXOL 300 MG/ML  SOLN
100.0000 mL | Freq: Once | INTRAMUSCULAR | Status: AC | PRN
  Administered 2012-10-27: 100 mL via INTRAVENOUS

## 2012-10-27 MED ORDER — DEXTROSE 5 % IV SOLN
1.0000 g | INTRAVENOUS | Status: DC
  Administered 2012-10-27 – 2012-10-29 (×3): 1 g via INTRAVENOUS
  Filled 2012-10-27 (×3): qty 10

## 2012-10-27 MED ORDER — AZITHROMYCIN 500 MG PO TABS
500.0000 mg | ORAL_TABLET | Freq: Every day | ORAL | Status: DC
  Administered 2012-10-27 – 2012-10-29 (×3): 500 mg via ORAL
  Filled 2012-10-27 (×3): qty 1

## 2012-10-27 NOTE — Consult Note (Addendum)
PULMONARY  / CRITICAL CARE MEDICINE  Name: Steven Beasley MRN: [id number] DOB: 1940-11-03    ADMISSION DATE:  10/26/2012 CONSULTATION DATE:  10/27/12  REFERRING MD :  Dr. Julian Reil PRIMARY SERVICE:  TRH  CHIEF COMPLAINT:  Hemoptysis  BRIEF PATIENT DESCRIPTION: 72 y.o. AA male with history of smoldering myeloma, GERD, and COPD presents to [facility] ED with 1 day history of frank hemoptysis.  PCCM called to consult for hemoptysis and possible bronch.      SIGNIFICANT EVENTS / STUDIES:  06/17/10 4 Lung nodules identified on CT scan 10/25/12 hemoptysis 7/09 admitted by hospitalists    ANTIBIOTICS: 7/10 Azithromycin>>> 7/10 Rocephin >>>   HISTORY OF PRESENT ILLNESS:  72 y.o. Male with PMH of smoldering myeloma, COPD, and GERD presents 10/26/12 to the [facility] ED with one day history of hemoptysis.  Patient states that he has had a near constant cough for the past year and coughs up "white frothy stuff".  He noticed 10/25/12 that he was coughing up frank blood.  Associated symptoms include 3 week history of nausea, 1 week history of anorexia, and mild intermittent hoarseness.  Patient denies bleeding from the mouth and gums, nosebleeds, chest pain, pleuritic chest pain, unintentional weight loss, or difficulty swallowing.  Patient is a current smoker and admits to 3 cigarettes per day.  He started smoking at age 38 and at his heaviest smoke 1 ppd.  He drinks 40 oz of beer over the weekends.  He denies illicit drug use.  Patient was born and raised in [place].  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  Patient denies exotic pets, recent travel, recent surgeries, or immobilization.  Patient has never experienced lung problems in the past.     PAST MEDICAL HISTORY :  Past Medical History  Diagnosis Date  . Hypertension   . Leukocytopenia   . Asthma   . Abdominal pain, chronic, generalized   . GERD (gastroesophageal reflux disease)   . BPH (benign prostatic hyperplasia)   .  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 Shortness of breath    History reviewed. No pertinent past surgical history. Prior to Admission medications   Medication Sig Start Date End Date Taking? Authorizing Provider  albuterol (PROVENTIL HFA;VENTOLIN HFA) 108 (90 BASE) MCG/ACT inhaler Inhale 2 puffs into the lungs every 6 (six) hours as needed for wheezing.   Yes Historical Provider, MD  amLODipine (NORVASC) 10 MG tablet Take 1 tablet (10 mg total) by mouth daily. 05/30/12  Yes Clanford Cyndie Mull, MD  esomeprazole (NEXIUM) 40 MG capsule Take 40 mg by mouth daily before breakfast.     Yes Historical Provider, MD  fluticasone (FLONASE) 50 MCG/ACT nasal spray Place 2 sprays into the nose daily. 09/15/12  Yes Acey Lav, MD  gabapentin (NEURONTIN) 300 MG capsule Take 300 mg by mouth 3 (three) times daily.     Yes Historical Provider, MD  pantoprazole (PROTONIX) 40 MG tablet Take 1 tablet (40 mg total) by mouth daily. 10/26/12  Yes Leroy Sea, MD  pentosan polysulfate (ELMIRON) 100 MG capsule Take 100 mg by mouth 3 (three) times daily before meals.    Yes Historical Provider, MD  Tamsulosin HCl (FLOMAX) 0.4 MG CAPS Take 0.4 mg by mouth daily.    Yes Historical Provider, MD  venlafaxine (EFFEXOR-XR) 150 MG 24 hr capsule Take 150 mg by mouth daily.     Yes Historical Provider, MD  albuterol (PROVENTIL) (2.5 MG/3ML) 0.083% nebulizer solution Take 3  mLs (2.5 mg total) by nebulization every 4 (four) hours as needed for wheezing or shortness of breath. 09/15/12   Acey Lav, MD  ipratropium (ATROVENT) 0.02 % nebulizer solution Take 2.5 mLs (500 mcg total) by nebulization every 4 (four) hours as needed for wheezing (shortness of breath). 09/15/12   Acey Lav, MD   No Known Allergies  FAMILY HISTORY:  History reviewed. No pertinent family history. SOCIAL HISTORY:  reports that he has quit smoking. He does not have any smokeless tobacco history on file. He reports that  drinks alcohol. He reports that he  does not use illicit drugs.  REVIEW OF SYSTEMS:    See HPI for pertinent positives and negatives  SUBJECTIVE:   VITAL SIGNS: Temp:  [97.2 F (36.2 C)-98.7 F (37.1 C)] 97.2 F (36.2 C) (07/10 0502) Pulse Rate:  [54-116] 60 (07/10 1350) Resp:  [16-18] 16 (07/10 0502) BP: (105-141)/(56-96) 105/56 mmHg (07/10 1350) SpO2:  [91 %-100 %] 100 % (07/10 0502) FiO2 (%):  [2 %] 2 % (07/10 0502) Weight:  [72.258 kg (159 lb 4.8 oz)-73.483 kg (162 lb)] 72.258 kg (159 lb 4.8 oz) (07/09 2257)  PHYSICAL EXAMINATION: General:  Pleasant AA male, NAD, lying in bed Neuro:  Mild tremor of the right hand, cog wheel rigidity, CN II-XII grossly intact HEENT:  PERL, EOMi, MM moist and pink, oropharynx clear Neck:  No JVD Cardiovascular:  RRR no murmurs noted Lungs:  Mild clubbing of the fingers, no increased resp effort, fine crackles right lung base Abdomen:  +BS, soft, non-tender Musculoskeletal:  No gross deformities, moves extremities x4 Skin:  No breakdowns noted   Recent Labs Lab 10/26/12 1803 10/27/12 0420  NA 133* 137  K 3.9 3.8  CL 93* 99  CO2  --  29  BUN 17 13  CREATININE 1.20 0.87  GLUCOSE 99 105*    Recent Labs Lab 10/26/12 1729 10/26/12 1803 10/27/12 0420  HGB 10.4* 11.2* 10.3*  HCT 29.8* 33.0* 29.2*  WBC 7.5  --  5.3  PLT 212  --  218   Dg Chest 2 View  10/26/2012   *RADIOLOGY REPORT*  Clinical Data: Hemoptysis, chronic cough for 1 year, start coughing up blood yesterday, history hypertension, asthma, GERD, myeloma  CHEST - 2 VIEW  Comparison: 09/05/2009  Findings: Normal heart size and pulmonary vascularity. Tortuous aorta with atherosclerotic calcification. Emphysematous changes consistent with COPD. Extensive right middle lobe infiltrate could represent pneumonia or pulmonary hemorrhage. Small right pleural effusion. Left nipple shadow corresponding to nipple marker on previous study. No pneumothorax or acute osseous findings. Scattered end plate spur formation thoracic  spine.  IMPRESSION: COPD with extensive consolidation within the right middle lobe question pneumonia versus pulmonary hemorrhage. Small right pleural effusion.   Original Report Authenticated By: Ulyses Southward, M.D.   Ct Chest W Contrast  10/27/2012   *RADIOLOGY REPORT*  Clinical Data: Pulmonary hemorrhage.  Evaluate for malignancy.  CT CHEST WITH CONTRAST  Technique:  Multidetector CT imaging of the chest was performed following the standard protocol during bolus administration of intravenous contrast.  Contrast: OMNIPAQUE IOHEXOL 300 MG/ML  SOLN  Comparison: 01/26/2011  Findings: Mild cardiac enlargement.  Normal caliber thoracic aorta. No dissection or aneurysm.  No significant lymphadenopathy in the chest.  Esophagus is decompressed.  There is airspace disease and consolidation in the right middle lung and right lower lung.  This could be pneumonia, aspiration, or pulmonary hemorrhage.  There is no significant bronchiectasis.  Airways appear patent.  No discrete mass lesion is demonstrated.  Given the history, follow-up is recommended after resolution of acute process to exclude underlying mass lesion.  Diffuse emphysematous changes throughout both lungs. No pneumothorax.  Sub centimeter pulmonary nodule in the right upper lung this is stable since the previous study and likely benign.  Circumscribed low attenuation lesion in the midportion of the right kidney measures 2.4 cm diameter. This is likely to represent a cyst but was not present on the previous study.  Recommend ultrasound to confirm cystic nature of this lesion.  Visualized portions of the upper abdominal organs are otherwise grossly unremarkable. Degenerative changes throughout the thoracic spine.  No destructive bone lesions appreciated.  IMPRESSION: Airspace disease and consolidation in the right middle lung and right lower lung.  This could represent pneumonia, aspiration, or hemorrhage.  No definite mass lesion identified.  Follow-up is  recommended after resolution of acute findings.  Indeterminate lesion in the right kidney, likely a cyst.  Ultrasound is recommended for confirmation.   Original Report Authenticated By: Burman Nieves, M.D.    ASSESSMENT / PLAN:  A: Hemoptysis - unclear cause, no endobronchial lesion obvious on CT but high risk for malignancy in this smoker , RLL infiltrate could represent pneumonia or aspirated blood. No obvious reason for upper airway bleeding   Pulmonary Nodules - sub cm, first noted feb 2012, may need FU CT int he future  P:  - Cont tessalon pearls - Cont Azithromycin, and rocephin - Bronch in am - PT/INR due to Pentosan (Low molecular weight heparin analogue for interstitial cystitis) - consider d/c Pentosan if PT > 15 - Cont BDs prn for wheezing  CT scan shows pneumonia vs. aspirated blood.  Patient at high risk for infection due to ineffective plasma cells from myeloma.  Will cont tessalon pearls to suppress cough, cover for infection, and perform inspection bronchoscopy in the morning. Can resume diet now but hold at midnight     The risks of the procedure including coughing, bleeding and the small chance of lung puncture requiring chest tube were discussed in great detail. The benefits & alternatives including serial follow up were also discussed.    Terri Piedra [place] Physician Assistant Student   Care during the described time interval was provided by me and/or other providers on the critical care team.  I have reviewed this patient's available data, including medical history, events of note, physical examination and test results as part of my evaluation  Rilley Poulter V.  10/27/2012, 3:25 PM

## 2012-10-27 NOTE — Progress Notes (Signed)
Pt having irregular beats, did a ECG and showed Afib 101, with R BBB no S/S, MD notified and aware, told to get another ECG if HR sustains greater than 110, and call MD again, will continue to monitor, thanks, Lavonda Jumbo RN

## 2012-10-27 NOTE — Progress Notes (Signed)
Patient evaluated for long-term disease management services with [facility] Care Management Program as a benefit of his [employer]. Explained services at bedside and consents were signed. Reports he lives alone but manages well at home. Will follow up with patient telephonically and he will be evaluated for monthly home visits if needed. Left packet and contact information.  Raiford Noble, MSN-Ed, RN,BSN- [facility] [phone]

## 2012-10-27 NOTE — Progress Notes (Signed)
[facility] PROGRESS NOTE  Steven Beasley GNF:[id number] DOB: 09/18/1940 DOA: 10/26/2012 PCP: [name], MD  Assessment/Plan: Hemoptysis -pneumonia versus hemorrhage -Will continue empiric antibiotics with Rocephin and Zithromax for possible pneumonia in this patient with multiple myeloma - I have consulted pulmonology-Dr. Vassie Loll to see patient for further recommendations/possible bronch -states he quit smoking Active Problems:  Smoldering myeloma  -Follow up oupt Pulmonary nodule/lesion -CT scan today only with subcentimeter pulmonary nodule in the right upper lung stable since previous study and possibly benign  -Pulmonology consulted as above COPD -Continue bronchodilators, add scheduled atrovent Hypertension -Continue Norvasc  Code Status: full Family Communication: Updated patient at the bedside, no family present Disposition Plan: to home when medically stable   Consultants:  pulmonology  Procedures:  none  Antibiotics:  Rocephin and zithromax started on 7/9  HPI/Subjective: Still with hemoptysis this a.m., states breathing better.  Objective: Filed Vitals:   10/26/12 1938 10/26/12 2021 10/26/12 2257 10/27/12 0502  BP: 136/70  113/96 108/73  Pulse: 71 107 102 54  Temp:   97.9 F (36.6 C) 97.2 F (36.2 C)  TempSrc:   Oral Oral  Resp: 16 16 16 16   Height:   6' (1.829 m)   Weight:   72.258 kg (159 lb 4.8 oz)   SpO2: 97% 91% 94% 100%    Intake/Output Summary (Last 24 hours) at 10/27/12 1029 Last data filed at 10/27/12 0700  Gross per 24 hour  Intake 1056.67 ml  Output      0 ml  Net 1056.67 ml   Filed Weights   10/26/12 2257  Weight: 72.258 kg (159 lb 4.8 oz)   Exam:  General: alert & oriented x  In NAD Cardiovascular: RRR, nl S1 s2 Respiratory: CTAB Abdomen: soft +BS NT/ND, no masses palpable Extremities: No cyanosis and no edema   Data Reviewed: Basic Metabolic Panel:  Recent Labs Lab 10/26/12 1803 10/27/12 0420  NA  133* 137  K 3.9 3.8  CL 93* 99  CO2  --  29  GLUCOSE 99 105*  BUN 17 13  CREATININE 1.20 0.87  CALCIUM  --  9.0   Liver Function Tests: No results found for this basename: AST, ALT, ALKPHOS, BILITOT, PROT, ALBUMIN,  in the last 168 hours No results found for this basename: LIPASE, AMYLASE,  in the last 168 hours No results found for this basename: AMMONIA,  in the last 168 hours CBC:  Recent Labs Lab 10/26/12 1729 10/26/12 1803 10/27/12 0420  WBC 7.5  --  5.3  NEUTROABS 4.5  --   --   HGB 10.4* 11.2* 10.3*  HCT 29.8* 33.0* 29.2*  MCV 91.4  --  91.8  PLT 212  --  218   Cardiac Enzymes: No results found for this basename: CKTOTAL, CKMB, CKMBINDEX, TROPONINI,  in the last 168 hours BNP (last 3 results) No results found for this basename: PROBNP,  in the last 8760 hours CBG: No results found for this basename: GLUCAP,  in the last 168 hours  No results found for this or any previous visit (from the past 240 hour(s)).   Studies: Dg Chest 2 View  10/26/2012   *RADIOLOGY REPORT*  Clinical Data: Hemoptysis, chronic cough for 1 year, start coughing up blood yesterday, history hypertension, asthma, GERD, myeloma  CHEST - 2 VIEW  Comparison: 09/05/2009  Findings: Normal heart size and pulmonary vascularity. Tortuous aorta with atherosclerotic calcification. Emphysematous changes consistent with COPD. Extensive right middle lobe infiltrate could represent pneumonia or pulmonary  hemorrhage. Small right pleural effusion. Left nipple shadow corresponding to nipple marker on previous study. No pneumothorax or acute osseous findings. Scattered end plate spur formation thoracic spine.  IMPRESSION: COPD with extensive consolidation within the right middle lobe question pneumonia versus pulmonary hemorrhage. Small right pleural effusion.   Original Report Authenticated By: Ulyses Southward, M.D.   Ct Chest W Contrast  10/27/2012   *RADIOLOGY REPORT*  Clinical Data: Pulmonary hemorrhage.  Evaluate for  malignancy.  CT CHEST WITH CONTRAST  Technique:  Multidetector CT imaging of the chest was performed following the standard protocol during bolus administration of intravenous contrast.  Contrast: OMNIPAQUE IOHEXOL 300 MG/ML  SOLN  Comparison: 01/26/2011  Findings: Mild cardiac enlargement.  Normal caliber thoracic aorta. No dissection or aneurysm.  No significant lymphadenopathy in the chest.  Esophagus is decompressed.  There is airspace disease and consolidation in the right middle lung and right lower lung.  This could be pneumonia, aspiration, or pulmonary hemorrhage.  There is no significant bronchiectasis.  Airways appear patent.  No discrete mass lesion is demonstrated.  Given the history, follow-up is recommended after resolution of acute process to exclude underlying mass lesion.  Diffuse emphysematous changes throughout both lungs. No pneumothorax.  Sub centimeter pulmonary nodule in the right upper lung this is stable since the previous study and likely benign.  Circumscribed low attenuation lesion in the midportion of the right kidney measures 2.4 cm diameter. This is likely to represent a cyst but was not present on the previous study.  Recommend ultrasound to confirm cystic nature of this lesion.  Visualized portions of the upper abdominal organs are otherwise grossly unremarkable. Degenerative changes throughout the thoracic spine.  No destructive bone lesions appreciated.  IMPRESSION: Airspace disease and consolidation in the right middle lung and right lower lung.  This could represent pneumonia, aspiration, or hemorrhage.  No definite mass lesion identified.  Follow-up is recommended after resolution of acute findings.  Indeterminate lesion in the right kidney, likely a cyst.  Ultrasound is recommended for confirmation.   Original Report Authenticated By: Burman Nieves, M.D.    Scheduled Meds: . amLODipine  10 mg Oral Daily  . fluticasone  2 spray Each Nare Daily  . gabapentin  300  mg Oral TID  . pantoprazole  80 mg Oral Q1200  . pentosan polysulfate  100 mg Oral TID AC  . sodium chloride  3 mL Intravenous Q12H  . tamsulosin  0.4 mg Oral Daily  . venlafaxine XR  150 mg Oral Daily   Continuous Infusions: . sodium chloride 100 mL/hr at 10/27/12 0038    Principal Problem:   Hemoptysis Active Problems:   Smoldering myeloma   Pulmonary nodules/lesions, multiple    Time spent: >35    [facility]  [facility] Pager [phone]. If 7PM-7AM, please contact night-coverage at [email or web address], password [facility] 10/27/2012, 10:29 AM  LOS: 1 day

## 2012-10-28 ENCOUNTER — Inpatient Hospital Stay (HOSPITAL_COMMUNITY): Payer: Medicare Other

## 2012-10-28 ENCOUNTER — Encounter (HOSPITAL_COMMUNITY)
Admission: EM | Disposition: A | Discharge status: Home / Self Care | Payer: Self-pay | Source: Home / Self Care | Attending: Internal Medicine

## 2012-10-28 DIAGNOSIS — K219 Gastro-esophageal reflux disease without esophagitis: Secondary | ICD-10-CM

## 2012-10-28 HISTORY — PX: VIDEO BRONCHOSCOPY: SHX5072

## 2012-10-28 SURGERY — VIDEO BRONCHOSCOPY WITHOUT FLUORO
Anesthesia: Moderate Sedation | Laterality: Bilateral

## 2012-10-28 MED ORDER — PHENYLEPHRINE HCL 0.25 % NA SOLN
NASAL | Status: DC | PRN
  Administered 2012-10-28: 2 via NASAL

## 2012-10-28 MED ORDER — LIDOCAINE HCL 2 % EX GEL
CUTANEOUS | Status: DC | PRN
  Administered 2012-10-28: 1

## 2012-10-28 MED ORDER — ALBUTEROL SULFATE (2.5 MG/3ML) 0.083% IN NEBU: 2.5000 mg | INHALATION_SOLUTION | RESPIRATORY_TRACT | Status: DC | PRN

## 2012-10-28 MED ORDER — IPRATROPIUM BROMIDE 0.02 % IN SOLN: 500.0000 ug | RESPIRATORY_TRACT | Status: DC | PRN

## 2012-10-28 MED ORDER — LIDOCAINE HCL (PF) 1 % IJ SOLN
INTRAMUSCULAR | Status: DC | PRN
  Administered 2012-10-28: 5 mL

## 2012-10-28 MED ORDER — MIDAZOLAM HCL 10 MG/2ML IJ SOLN
INTRAMUSCULAR | Status: DC | PRN
  Administered 2012-10-28 (×2): 1 mg via INTRAVENOUS

## 2012-10-28 MED ORDER — CEFUROXIME AXETIL 500 MG PO TABS: 500.0000 mg | ORAL_TABLET | Freq: Two times a day (BID) | ORAL | Status: DC

## 2012-10-28 MED ORDER — BENZONATATE 200 MG PO CAPS: 200.0000 mg | ORAL_CAPSULE | Freq: Three times a day (TID) | ORAL | Status: DC | PRN

## 2012-10-28 MED ORDER — FENTANYL CITRATE 0.05 MG/ML IJ SOLN
INTRAMUSCULAR | Status: DC | PRN
  Administered 2012-10-28 (×3): 25 ug via INTRAVENOUS

## 2012-10-28 MED ORDER — AZITHROMYCIN 500 MG PO TABS: 500.0000 mg | ORAL_TABLET | Freq: Every day | ORAL | Status: DC

## 2012-10-28 NOTE — Progress Notes (Signed)
PULMONARY  / CRITICAL CARE MEDICINE  Name: Steven Beasley MRN: [id number] DOB: [birth year]    ADMISSION DATE:  10/26/2012 CONSULTATION DATE:  10/27/12  REFERRING MD :  Dr. Julian Reil PRIMARY SERVICE:  TRH  CHIEF COMPLAINT:  Hemoptysis  BRIEF PATIENT DESCRIPTION: 72 y.o. AA male with history of smoldering myeloma, GERD, and COPD presents to [facility] ED with 1 day history of frank hemoptysis.  PCCM called to consult for hemoptysis and possible bronch.      SIGNIFICANT EVENTS / STUDIES:  06/17/10 4 Lung nodules identified on CT scan 10/25/12 hemoptysis 7/09 admitted by hospitalists    ANTIBIOTICS: 7/10 Azithromycin>>> 7/10 Rocephin >>>    SUBJECTIVE: afebrile C/o blood tinged sputum this am, no chest pain or dyspnea  VITAL SIGNS: Temp:  [97.1 F (36.2 C)-97.9 F (36.6 C)] 97.9 F (36.6 C) (07/11 0556) Pulse Rate:  [60-84] 84 (07/11 0556) Resp:  [13-18] 17 (07/11 0725) BP: (100-158)/(53-82) 127/74 mmHg (07/11 0725) SpO2:  [98 %-100 %] 98 % (07/11 0725) Weight:  [158 lb 3.2 oz (71.759 kg)] 158 lb 3.2 oz (71.759 kg) (07/11 0556)  PHYSICAL EXAMINATION: General:  Pleasant AA male, NAD, lying in bed Neuro:  Mild tremor of the right hand, cog wheel rigidity, CN II-XII grossly intact HEENT:  PERL, EOMi, MM moist and pink, oropharynx clear Neck:  No JVD Cardiovascular:  RRR no murmurs noted Lungs:  Mild clubbing of the fingers, no increased resp effort, fine crackles right lung base Abdomen:  +BS, soft, non-tender Musculoskeletal:  No gross deformities, moves extremities x4 Skin:  No breakdowns noted   Recent Labs Lab 10/26/12 1803 10/27/12 0420  NA 133* 137  K 3.9 3.8  CL 93* 99  CO2  --  29  BUN 17 13  CREATININE 1.20 0.87  GLUCOSE 99 105*    Recent Labs Lab 10/26/12 1729 10/26/12 1803 10/27/12 0420  HGB 10.4* 11.2* 10.3*  HCT 29.8* 33.0* 29.2*  WBC 7.5  --  5.3  PLT 212  --  218   Dg Chest 2 View  10/26/2012   *RADIOLOGY REPORT*  Clinical Data: Hemoptysis,  chronic cough for 1 year, start coughing up blood yesterday, history hypertension, asthma, GERD, myeloma  CHEST - 2 VIEW  Comparison: 09/05/2009  Findings: Normal heart size and pulmonary vascularity. Tortuous aorta with atherosclerotic calcification. Emphysematous changes consistent with COPD. Extensive right middle lobe infiltrate could represent pneumonia or pulmonary hemorrhage. Small right pleural effusion. Left nipple shadow corresponding to nipple marker on previous study. No pneumothorax or acute osseous findings. Scattered end plate spur formation thoracic spine.  IMPRESSION: COPD with extensive consolidation within the right middle lobe question pneumonia versus pulmonary hemorrhage. Small right pleural effusion.   Original Report Authenticated By: Ulyses Southward, M.D.   Ct Chest W Contrast  10/27/2012   *RADIOLOGY REPORT*  Clinical Data: Pulmonary hemorrhage.  Evaluate for malignancy.  CT CHEST WITH CONTRAST  Technique:  Multidetector CT imaging of the chest was performed following the standard protocol during bolus administration of intravenous contrast.  Contrast: OMNIPAQUE IOHEXOL 300 MG/ML  SOLN  Comparison: 01/26/2011  Findings: Mild cardiac enlargement.  Normal caliber thoracic aorta. No dissection or aneurysm.  No significant lymphadenopathy in the chest.  Esophagus is decompressed.  There is airspace disease and consolidation in the right middle lung and right lower lung.  This could be pneumonia, aspiration, or pulmonary hemorrhage.  There is no significant bronchiectasis.  Airways appear patent.  No discrete mass lesion is demonstrated.  Given the  history, follow-up is recommended after resolution of acute process to exclude underlying mass lesion.  Diffuse emphysematous changes throughout both lungs. No pneumothorax.  Sub centimeter pulmonary nodule in the right upper lung this is stable since the previous study and likely benign.  Circumscribed low attenuation lesion in the midportion of  the right kidney measures 2.4 cm diameter. This is likely to represent a cyst but was not present on the previous study.  Recommend ultrasound to confirm cystic nature of this lesion.  Visualized portions of the upper abdominal organs are otherwise grossly unremarkable. Degenerative changes throughout the thoracic spine.  No destructive bone lesions appreciated.  IMPRESSION: Airspace disease and consolidation in the right middle lung and right lower lung.  This could represent pneumonia, aspiration, or hemorrhage.  No definite mass lesion identified.  Follow-up is recommended after resolution of acute findings.  Indeterminate lesion in the right kidney, likely a cyst.  Ultrasound is recommended for confirmation.   Original Report Authenticated By: Burman Nieves, M.D.    ASSESSMENT / PLAN:  A: Hemoptysis - unclear cause, no endobronchial lesion obvious on CT but high risk for malignancy in this smoker , RLL infiltrate could represent pneumonia or aspirated blood. No obvious reason for upper airway bleeding   Pulmonary Nodules - sub cm, first noted feb 2012, may need FU CT int he future  P:  - Cont tessalon pearls - Cont Azithromycin, and rocephin - Bronch to r/o endobronchial lesions - PT/INR due to Pentosan (Low molecular weight heparin analogue for interstitial cystitis) - Cont BDs prn for wheezing   ALVA,RAKESH V.  10/28/2012, 7:56 AM

## 2012-10-28 NOTE — Progress Notes (Signed)
Bronchoscopy performed with intervention BAL 

## 2012-10-28 NOTE — Progress Notes (Signed)
Pt. Has order to d/c home. On call for hospitalist, Lynch, notified. Okay for patient to stay until am. Order for d/c home to night will be cancelled. Pt. Also has order to discontinue telemetry after 48 hours. CMT notified. Pt. Currently alert and oriented. No s/s of distress or discomfort noted. Pt. Denies pain. RN will continue to monitor pt. Throughout the night. Jewett Mcgann, Cheryll Dessert

## 2012-10-28 NOTE — Progress Notes (Signed)
Pt returned from EGD. Alert/oriented VS done

## 2012-10-28 NOTE — Progress Notes (Signed)
0848 received tel report , Jamie,RN Bronchoscopy RN [phone] Pt back to room Fully awake alert and oriented respiration easy and regular . With occasional cough . No bleedng noted  1000 with poor Gag and swallowing reflexes .

## 2012-10-28 NOTE — Op Note (Signed)
Indication:Unexplained hemoptysis with RLL pulmonary infiltrates in the 72 -year-old smoker  Written informed consent was obtained from the patient prior to the procedure. The risks of the procedure including coughing, bleeding and a small chance of lung cancer requiring a chest tube were discussed with the patient in great detail and evidenced understanding.  2 mg of Versed and  75 mcg of fentanyl were used in divided doses during the procedure. Bronchoscope was inserted from the right Nare. The upper airway appeared normal. Vocal cord showed normal appearance in motion. The trachea bronchial tree was then examined to the subsegmental level. Mild whitish secretions were noted. No endobronchial lesions were noted.  Attention was then turned to the right lower lobe. Bronchoalveolar lavage was obtained from the right lower lobe with good return.  The patient tolerated procedure well with nobleeding.  He was awake and alert in the end of the procedure. BAl sent for cytology, culture & afb for completion. He can be discharged on PO abx x 1 week & FU arranged with [place]  Steven Beasley V.

## 2012-10-28 NOTE — Discharge Summary (Addendum)
Physician Discharge Summary  Steven Beasley:[id number] DOB: 04/06/1941 DOA: 10/26/2012  PCP: Steven Dakins, MD  Admit date: 10/26/2012 Discharge date: 10/28/2012  Time spent: <30 minutes  Recommendations for Outpatient Follow-up:      Follow-up Information   Follow up with PARRETT,TAMMY, NP On 11/08/2012. (1045 am)    Contact information:   [address] [place] [address] [phone]       Follow up with Steven Dakins, MD. (in 1-2weeks, call for appt upon discharge)    Contact information:   [address] [place] [address] [phone]        Discharge Diagnoses:  Principal Problem:   Hemoptysis Active Problems:   Smoldering myeloma   Pulmonary nodule/lesion   Discharge Condition: improved/stable  Diet recommendation: heart healthy  Filed Weights   10/26/12 2257 10/28/12 0556  Weight: 72.258 kg (159 lb 4.8 oz) 71.759 kg (158 lb 3.2 oz)    History of present illness:  Steven Beasley is a 72 y.o. male History of smoldering myeloma, GERD, lung nodule noted on x-ray few years ago (4 pulm nodules found on CT chest 05/28/10) is here for a cough which has been there for a few weeks, initially productive of clear sputum; however, in the last 24 hours he has noticed that his cough is now productive of bloody sputum, he says is largely blood and not blood tinged sputum, and denies any fever chills or shortness of breath, no exposure to tuberculosis, no recent unintentional weight loss, no night sweats, good appetite. Also complains of mild heartburn.  He was initially seen by Steven Beasley at [facility] and sent over to the ED for further work up, in the ED his hemoglobin was somewhat decreased initially at 10.4 / 11.2 compared to 13.2 a month ago. CXR did show area of infiltrate believed to be related to pulmonary hemorrhage in the RML, hospitalist has been asked to admit for further work up.   Hospital Course:  Hemoptysis -pneumonia versus hemorrhage  -Upon  admission patient had a chest x-ray done which showed pneumonia versus hemorrhage, a CT scan of chest was done and showed airspace disease and consolidation in the right middle lung and right long pneumonia versus aspiration or hemorrhage. -He had been started empiric antibiotics with Rocephin and Zithromax in the ED and this were to continued  -Pulmonology was consulted and saw the patient and bronchoscopy was done and no endobronchial lesions were noted are, and patient tolerated procedure well without bleeding. BAL was sent for cytology culture and AFB. -Patient has improved clinically he still has some hemoptysis but his hemoglobin is remaining stable -Following the procedure Dr. Vassie Beasley recommended discharge on oral antibiotics for a week and followup with pulmonology outpatient arranged -states he quit smoking   Active Problems:  Smoldering myeloma  -Follow up oupt scheduled. Pulmonary nodule/lesion  -CT scan today only with subcentimeter pulmonary nodule in the right upper lung stable since previous study and possibly benign  -Status post bronchoscopy with no endobronchial lesions as above, his to followup with pulmonology outpatient. COPD  -Continue bronchodilators, add scheduled atrovent  Hypertension  -Continue Norvasc   Procedures:  Bronchoscopy- 7/11 per Dr Steven Beasley  Consultations:  Pulmonology Dr Steven Beasley  Discharge Exam: Filed Vitals:   10/28/12 0850 10/28/12 0910 10/28/12 1451 10/28/12 1813  BP: 118/86 107/87 114/76 103/58  Pulse:  72 67 85  Temp:  97.3 F (36.3 C) 97.8 F (36.6 C) 97.4 F (36.3 C)  TempSrc:  Oral Oral  Oral  Resp: 26 21 19 20   Height:      Weight:      SpO2: 92% 96% 99% 94%   Exam:  General: alert & oriented x In NAD  Cardiovascular: RRR, nl S1 s2  Respiratory: CTAB  Abdomen: soft +BS NT/ND, no masses palpable  Extremities: No cyanosis and no edema    Discharge Instructions  Discharge Orders   Future Appointments Provider Department Dept  Phone   11/08/2012 11:00 AM Steven Sicks, NP [facility] [phone]   12/30/2012 9:00 AM [facility] [phone]   03/31/2013 9:00 AM [facility] Only [facility] [phone]   04/07/2013 9:00 AM [facility] Covering [facility] [phone]   Future Orders Complete By Expires     Diet - low sodium heart healthy  As directed     Increase activity slowly  As directed         Medication List    STOP taking these medications       pantoprazole 40 MG tablet  Commonly known as:  PROTONIX      TAKE these medications       albuterol (2.5 MG/3ML) 0.083% nebulizer solution  Commonly known as:  PROVENTIL  Take 3 mLs (2.5 mg total) by nebulization every 4 (four) hours as needed for wheezing or shortness of breath.     albuterol 108 (90 BASE) MCG/ACT inhaler  Commonly known as:  PROVENTIL HFA;VENTOLIN HFA  Inhale 2 puffs into the lungs every 6 (six) hours as needed for wheezing.     amLODipine 10 MG tablet  Commonly known as:  NORVASC  Take 1 tablet (10 mg total) by mouth daily.     azithromycin 500 MG tablet  Commonly known as:  ZITHROMAX  Take 1 tablet (500 mg total) by mouth daily.     benzonatate 200 MG capsule  Commonly known as:  TESSALON  Take 1 capsule (200 mg total) by mouth 3 (three) times daily as needed for cough.     cefUROXime 500 MG tablet  Commonly known as:  CEFTIN  Take 1 tablet (500 mg total) by mouth 2 (two) times daily.     esomeprazole 40 MG capsule  Commonly known as:  NEXIUM  Take 40 mg by mouth daily before breakfast.     fluticasone 50 MCG/ACT nasal spray  Commonly known as:  FLONASE  Place 2 sprays into the nose daily.     gabapentin 300 MG capsule  Commonly known as:  NEURONTIN  Take 300 mg by mouth 3 (three) times daily.     ipratropium 0.02 % nebulizer solution  Commonly known as:  ATROVENT  Take 2.5 mLs  (500 mcg total) by nebulization every 4 (four) hours as needed for wheezing (shortness of breath).     pentosan polysulfate 100 MG capsule  Commonly known as:  ELMIRON  Take 100 mg by mouth 3 (three) times daily before meals.     tamsulosin 0.4 MG Caps  Commonly known as:  FLOMAX  Take 0.4 mg by mouth daily.     venlafaxine XR 150 MG 24 hr capsule  Commonly known as:  EFFEXOR-XR  Take 150 mg by mouth daily.       No Known Allergies     Follow-up Information   Follow up with PARRETT,TAMMY, NP On 11/08/2012. (1045 am)    Contact information:   [address] [facility]  [place] [address] [phone]       Follow up with Steven Dakins, MD. (in 1-2weeks, call for appt upon discharge)    Contact information:   [address] [place] [address] [phone]        The results of significant diagnostics from this hospitalization (including imaging, microbiology, ancillary and laboratory) are listed below for reference.    Significant Diagnostic Studies: Dg Chest 2 View  10/26/2012   *RADIOLOGY REPORT*  Clinical Data: Hemoptysis, chronic cough for 1 year, start coughing up blood yesterday, history hypertension, asthma, GERD, myeloma  CHEST - 2 VIEW  Comparison: 09/05/2009  Findings: Normal heart size and pulmonary vascularity. Tortuous aorta with atherosclerotic calcification. Emphysematous changes consistent with COPD. Extensive right middle lobe infiltrate could represent pneumonia or pulmonary hemorrhage. Small right pleural effusion. Left nipple shadow corresponding to nipple marker on previous study. No pneumothorax or acute osseous findings. Scattered end plate spur formation thoracic spine.  IMPRESSION: COPD with extensive consolidation within the right middle lobe question pneumonia versus pulmonary hemorrhage. Small right pleural effusion.   Original Report Authenticated By: Ulyses Southward, M.D.   Ct Chest W Contrast  10/27/2012   *RADIOLOGY REPORT*  Clinical Data:  Pulmonary hemorrhage.  Evaluate for malignancy.  CT CHEST WITH CONTRAST  Technique:  Multidetector CT imaging of the chest was performed following the standard protocol during bolus administration of intravenous contrast.  Contrast: OMNIPAQUE IOHEXOL 300 MG/ML  SOLN  Comparison: 01/26/2011  Findings: Mild cardiac enlargement.  Normal caliber thoracic aorta. No dissection or aneurysm.  No significant lymphadenopathy in the chest.  Esophagus is decompressed.  There is airspace disease and consolidation in the right middle lung and right lower lung.  This could be pneumonia, aspiration, or pulmonary hemorrhage.  There is no significant bronchiectasis.  Airways appear patent.  No discrete mass lesion is demonstrated.  Given the history, follow-up is recommended after resolution of acute process to exclude underlying mass lesion.  Diffuse emphysematous changes throughout both lungs. No pneumothorax.  Sub centimeter pulmonary nodule in the right upper lung this is stable since the previous study and likely benign.  Circumscribed low attenuation lesion in the midportion of the right kidney measures 2.4 cm diameter. This is likely to represent a cyst but was not present on the previous study.  Recommend ultrasound to confirm cystic nature of this lesion.  Visualized portions of the upper abdominal organs are otherwise grossly unremarkable. Degenerative changes throughout the thoracic spine.  No destructive bone lesions appreciated.  IMPRESSION: Airspace disease and consolidation in the right middle lung and right lower lung.  This could represent pneumonia, aspiration, or hemorrhage.  No definite mass lesion identified.  Follow-up is recommended after resolution of acute findings.  Indeterminate lesion in the right kidney, likely a cyst.  Ultrasound is recommended for confirmation.   Original Report Authenticated By: Burman Nieves, M.D.    Microbiology: Recent Results (from the past 240 hour(s))  CULTURE,  RESPIRATORY (NON-EXPECTORATED)     Status: None   Collection Time    10/28/12  8:17 AM      Result Value Range Status   Specimen Description BRONCHIAL WASHINGS   Final   Special Requests RLL   Final   Gram Stain     Final   Value: FEW WBC PRESENT, PREDOMINANTLY PMN     NO SQUAMOUS EPITHELIAL CELLS SEEN     NO ORGANISMS SEEN   Culture PENDING   Incomplete   Report Status PENDING   Incomplete  Labs: Basic Metabolic Panel:  Recent Labs Lab 10/26/12 1803 10/27/12 0420  NA 133* 137  K 3.9 3.8  CL 93* 99  CO2  --  29  GLUCOSE 99 105*  BUN 17 13  CREATININE 1.20 0.87  CALCIUM  --  9.0   Liver Function Tests: No results found for this basename: AST, ALT, ALKPHOS, BILITOT, PROT, ALBUMIN,  in the last 168 hours No results found for this basename: LIPASE, AMYLASE,  in the last 168 hours No results found for this basename: AMMONIA,  in the last 168 hours CBC:  Recent Labs Lab 10/26/12 1729 10/26/12 1803 10/27/12 0420  WBC 7.5  --  5.3  NEUTROABS 4.5  --   --   HGB 10.4* 11.2* 10.3*  HCT 29.8* 33.0* 29.2*  MCV 91.4  --  91.8  PLT 212  --  218   Cardiac Enzymes: No results found for this basename: CKTOTAL, CKMB, CKMBINDEX, TROPONINI,  in the last 168 hours BNP: BNP (last 3 results) No results found for this basename: PROBNP,  in the last 8760 hours CBG: No results found for this basename: GLUCAP,  in the last 168 hours     Signed:  Sabel Hornbeck C  [facility] 10/28/2012, 7:24 PM

## 2012-10-29 NOTE — Progress Notes (Signed)
[facility] PROGRESS NOTE  [name] [name] OZH:[id number] DOB: 10-Feb-1941 DOA: 10/26/2012 PCP: [name], MD I have seen and examined pt today, was discharged last p.m. but stayed overnight as he stated he did not have a ride home. he has remained afebrile and hemodynamically stable with no shortness of breath Lungs -clear Cardiovascular-regular rate and rhythm normal S1-S2 Abdomen-soft, bowel sounds present -Patient remains medically stable for discharge today please see discharge summary of 7/11 for details of hospital stay and  outpt followup.     Kela Millin  [facility] Pager [phone]. If 7PM-7AM, please contact night-coverage at [email or web address], password [facility] 10/29/2012, 11:57 AM  LOS: 3 days

## 2012-10-29 NOTE — Significant Event (Signed)
Dr Suanne Marker called pt ok to d/c home .

## 2012-10-30 LAB — CULTURE, RESPIRATORY W GRAM STAIN

## 2012-10-31 ENCOUNTER — Encounter (HOSPITAL_COMMUNITY): Payer: Self-pay | Admitting: Pulmonary Disease

## 2012-11-08 ENCOUNTER — Ambulatory Visit (INDEPENDENT_AMBULATORY_CARE_PROVIDER_SITE_OTHER)
Admission: RE | Admit: 2012-11-08 | Discharge: 2012-11-08 | Disposition: A | Discharge status: Home / Self Care | Payer: Medicare Other | Source: Ambulatory Visit | Attending: Adult Health | Admitting: Adult Health

## 2012-11-08 ENCOUNTER — Ambulatory Visit (INDEPENDENT_AMBULATORY_CARE_PROVIDER_SITE_OTHER): Payer: Medicare Other | Admitting: Adult Health

## 2012-11-08 ENCOUNTER — Encounter: Payer: Self-pay | Admitting: Adult Health

## 2012-11-08 VITALS — BP 104/64 | HR 84 | Temp 97.5°F | Ht 70.5 in | Wt 160.0 lb

## 2012-11-08 DIAGNOSIS — R05 Cough: Secondary | ICD-10-CM

## 2012-11-08 DIAGNOSIS — R042 Hemoptysis: Secondary | ICD-10-CM

## 2012-11-08 DIAGNOSIS — J189 Pneumonia, unspecified organism: Secondary | ICD-10-CM

## 2012-11-08 DIAGNOSIS — R918 Other nonspecific abnormal finding of lung field: Secondary | ICD-10-CM

## 2012-11-08 DIAGNOSIS — R059 Cough, unspecified: Secondary | ICD-10-CM

## 2012-11-08 MED ORDER — ALBUTEROL SULFATE (2.5 MG/3ML) 0.083% IN NEBU: 2.5000 mg | INHALATION_SOLUTION | RESPIRATORY_TRACT | Status: DC | PRN

## 2012-11-08 NOTE — Patient Instructions (Addendum)
May use Mucinex DM Twice daily  As needed  Cough/congestion .  May use Albuterol neb every 4 hrs as needed  For wheezing.  Follow up Dr. Vassie Loll  In 3-4 with weeks with PFT and chest xray .  Please contact office for sooner follow up if symptoms do not improve or worsen or seek emergency care

## 2012-11-08 NOTE — Assessment & Plan Note (Signed)
Resolved

## 2012-11-08 NOTE — Assessment & Plan Note (Signed)
Right sided infiltrate with hemoptysis.  S/p FOB 10/2012 w/ neg malignant cells  Improving with abx .  CXr showed decrease right sided infiltrates  He will return in 3-4 weeks with cxr and PFT

## 2012-11-08 NOTE — Progress Notes (Signed)
  Subjective:    Patient ID: Steven Beasley, male    DOB: 1940/06/11, 72 y.o.   MRN: [id number]  HPI 72 yo male former smoker seen for initial PCCM consult during admission 10/26/12 for hemoptysis.  Hx of smoldering myeloma followed by Dr. Gaylyn Rong.   11/08/2012 Post Hospital follow up  Pt returns for post hospital follow up . He was admitted 10/26/12 with hemoptysis . CXR did show area of infiltrate believed to be related to pulmonary hemorrhage in the RML, CT chest showed  airspace disease and consolidation in the right middle lung and right long pneumonia versus aspiration or hemorrhage.  -He had been started empiric antibiotics with Rocephin and Zithromax in the ED and this were to continued  -Pulmonology was consulted and saw the patient and bronchoscopy was done and no endobronchial lesions were noted are, and patient tolerated procedure well without bleeding. BAL was sent for cytology culture and AFB. That was neg for malignant cells. -CT scan today showed a  subcentimeter pulmonary nodule in the right upper lung stable since previous study and possibly benign   Since discharge he is feeling better . He has finished his abx. CXR today shows slight decrease in R sided infiltrate.  He has had no further hemoptysis. No fever, chest pain or edema. Says he has quit smoking.  Only taking albuterol nebs As needed   Needs new machine.   Review of Systems Constitutional:   No  weight loss, night sweats,  Fevers, chills, fatigue, or  lassitude.  HEENT:   No headaches,  Difficulty swallowing,  Tooth/dental problems, or  Sore throat,                No sneezing, itching, ear ache, nasal congestion, post nasal drip,   CV:  No chest pain,  Orthopnea, PND, swelling in lower extremities, anasarca, dizziness, palpitations, syncope.   GI  No heartburn, indigestion, abdominal pain, nausea, vomiting, diarrhea, change in bowel habits, loss of appetite, bloody stools.   Resp: No shortness of breath with exertion  or at rest.  No excess mucus, no productive cough,  No non-productive cough,  No coughing up of blood.  No change in color of mucus.  No wheezing.  No chest wall deformity  Skin: no rash or lesions.  GU: no dysuria, change in color of urine, no urgency or frequency.  No flank pain, no hematuria   MS:  No joint pain or swelling.  No decreased range of motion.  No back pain.  Psych:  No change in mood or affect. No depression or anxiety.  No memory loss.         Objective:   Physical Exam  GEN: A/Ox3; pleasant , NAD, thin male   HEENT:  [place]/AT,  EACs-clear, TMs-wnl, NOSE-clear, THROAT-clear, no lesions, no postnasal drip or exudate noted.   NECK:  Supple w/ fair ROM; no JVD; normal carotid impulses w/o bruits; no thyromegaly or nodules palpated; no lymphadenopathy.  RESP  Diminshed BS in bases .no accessory muscle use, no dullness to percussion  CARD:  RRR, no m/r/g  , no peripheral edema, pulses intact, no cyanosis or clubbing.  GI:   Soft & nt; nml bowel sounds; no organomegaly or masses detected.  Musco: Warm bil, no deformities or joint swelling noted.   Neuro: alert, no focal deficits noted.    Skin: Warm, no lesions or rashes        Assessment & Plan:

## 2012-11-20 LAB — FUNGUS CULTURE W SMEAR: Fungal Smear: NONE SEEN

## 2012-12-09 ENCOUNTER — Encounter: Payer: Self-pay | Admitting: Adult Health

## 2012-12-09 ENCOUNTER — Ambulatory Visit (INDEPENDENT_AMBULATORY_CARE_PROVIDER_SITE_OTHER)
Admission: RE | Admit: 2012-12-09 | Discharge: 2012-12-09 | Disposition: A | Discharge status: Home / Self Care | Payer: Medicare Other | Source: Ambulatory Visit | Attending: Pulmonary Disease | Admitting: Pulmonary Disease

## 2012-12-09 ENCOUNTER — Ambulatory Visit (INDEPENDENT_AMBULATORY_CARE_PROVIDER_SITE_OTHER): Payer: Medicare Other | Admitting: Adult Health

## 2012-12-09 ENCOUNTER — Ambulatory Visit (INDEPENDENT_AMBULATORY_CARE_PROVIDER_SITE_OTHER): Payer: Medicare Other | Admitting: Pulmonary Disease

## 2012-12-09 VITALS — BP 150/90 | HR 86 | Temp 97.0°F | Ht 70.5 in | Wt 157.0 lb

## 2012-12-09 DIAGNOSIS — R918 Other nonspecific abnormal finding of lung field: Secondary | ICD-10-CM

## 2012-12-09 DIAGNOSIS — J189 Pneumonia, unspecified organism: Secondary | ICD-10-CM

## 2012-12-09 DIAGNOSIS — J449 Chronic obstructive pulmonary disease, unspecified: Secondary | ICD-10-CM

## 2012-12-09 DIAGNOSIS — J45909 Unspecified asthma, uncomplicated: Secondary | ICD-10-CM

## 2012-12-09 LAB — PULMONARY FUNCTION TEST

## 2012-12-09 MED ORDER — FLUTICASONE FUROATE-VILANTEROL 100-25 MCG/INH IN AEPB: 1.0000 | INHALATION_SPRAY | Freq: Every day | RESPIRATORY_TRACT | Status: DC

## 2012-12-09 NOTE — Progress Notes (Signed)
Subjective:    Patient ID: Steven Beasley, male    DOB: 1940-12-13, 72 y.o.   MRN: [id number]  HPI  72 yo male former smoker seen for initial PCCM consult during admission 10/26/12 for hemoptysis.  Hx of smoldering myeloma followed by Dr. Gaylyn Rong.   11/08/2012 Post Hospital follow up  Pt returns for post hospital follow up . He was admitted 10/26/12 with hemoptysis . CXR did show area of infiltrate believed to be related to pulmonary hemorrhage in the RML, CT chest showed  airspace disease and consolidation in the right middle lung and right long pneumonia versus aspiration or hemorrhage.  -He had been started empiric antibiotics with Rocephin and Zithromax in the ED and this were to continued  -Pulmonology was consulted and saw the patient and bronchoscopy was done and no endobronchial lesions were noted are, and patient tolerated procedure well without bleeding. BAL was sent for cytology culture and AFB. That was neg for malignant cells. -CT scan today showed a  subcentimeter pulmonary nodule in the right upper lung stable since previous study and possibly benign   Since discharge he is feeling better . He has finished his abx. CXR today shows slight decrease in R sided infiltrate.  He has had no further hemoptysis. No fever, chest pain or edema. Says he has quit smoking.  Only taking albuterol nebs As needed   Needs new machine.  >>follow up for PFT   12/09/2012 Follow up  Returns for follow up with with PFT and CXR.   Breathing is better.  Does have some wheezing, runny nose.  No SOB, chest tightness, or chest pain.   PFT today shows FEV1 21%, +BD response, ratio 40. Experienced quite a bit of coughing during PFT, unable to calculate diffucing capacity.  CXR shows Persistent infiltrate in the right lower lobe and right middle lobe consistent with pneumonia. There is been improvement of the right  middle lobe infiltrate but a similar pattern of the right lower lobe infiltrate. Pt denies wt loss,  hemoptysis, chest pain. Or edema.     Review of Systems  Constitutional:   No  weight loss, night sweats,  Fevers, chills, fatigue, or  lassitude.  HEENT:   No headaches,  Difficulty swallowing,  Tooth/dental problems, or  Sore throat,                No sneezing, itching, ear ache, nasal congestion, post nasal drip,   CV:  No chest pain,  Orthopnea, PND, swelling in lower extremities, anasarca, dizziness, palpitations, syncope.   GI  No heartburn, indigestion, abdominal pain, nausea, vomiting, diarrhea, change in bowel habits, loss of appetite, bloody stools.   Resp:   No chest wall deformity  Skin: no rash or lesions.  GU: no dysuria, change in color of urine, no urgency or frequency.  No flank pain, no hematuria   MS:  No joint pain or swelling.  No decreased range of motion.  No back pain.  Psych:  No change in mood or affect. No depression or anxiety.  No memory loss.         Objective:   Physical Exam   GEN: A/Ox3; pleasant , NAD, thin male   HEENT:  [place]/AT,  EACs-clear, TMs-wnl, NOSE-clear, THROAT-clear, no lesions, no postnasal drip or exudate noted.   NECK:  Supple w/ fair ROM; no JVD; normal carotid impulses w/o bruits; no thyromegaly or nodules palpated; no lymphadenopathy.  RESP  Diminshed BS in bases .no accessory muscle use,  no dullness to percussion  CARD:  RRR, no m/r/g  , no peripheral edema, pulses intact, no cyanosis or clubbing.  GI:   Soft & nt; nml bowel sounds; no organomegaly or masses detected.  Musco: Warm bil, no deformities or joint swelling noted.   Neuro: alert, no focal deficits noted.    Skin: Warm, no lesions or rashes   CXR 12/09/12 >Persistent infiltrate in the right lower lobe and right middle lobe  consistent with pneumonia. There is been improvement of the right  middle lobe infiltrate but a similar pattern of the right lower  lobe infiltrate.      Assessment & Plan:

## 2012-12-09 NOTE — Progress Notes (Signed)
Spirometry before and after done today, pt unable to perform DLCO and Lung volumes.

## 2012-12-09 NOTE — Patient Instructions (Addendum)
Begin Breo 1 puff daily  follow up Dr. Vassie Loll  In 4-6 weeks with chest xray .  Check overnight oximetry while you are sleeping.  Please contact office for sooner follow up if symptoms do not improve or worsen or seek emergency care

## 2012-12-10 LAB — AFB CULTURE WITH SMEAR (NOT AT ARMC)

## 2012-12-13 ENCOUNTER — Telehealth: Payer: Self-pay | Admitting: Pulmonary Disease

## 2012-12-13 DIAGNOSIS — J449 Chronic obstructive pulmonary disease, unspecified: Secondary | ICD-10-CM | POA: Insufficient documentation

## 2012-12-13 NOTE — Assessment & Plan Note (Addendum)
Clinically improving  CXR shows persistent right sided infiltrate  Last CT chest 10/2012 with Airspace disease and consolidation in the right middle lung and right lower lung  Will cont to follow on serial cxr is not improving , will need to repeat CT in October.   Plan  follow up Dr. Vassie Loll  In 4-6 weeks with chest xray .  Check overnight oximetry while you are sleeping.  Please contact office for sooner follow up if symptoms do not improve or worsen or seek emergency care

## 2012-12-13 NOTE — Assessment & Plan Note (Addendum)
PFT show severe airflow obstruction with +BD response-although significant cough and ? Effort during test    Plan:  Begin Breo 1 puff daily  follow up Dr. Vassie Loll  In 4-6 weeks with chest xray .  Check overnight oximetry while you are sleeping.  Please contact office for sooner follow up if symptoms do not improve or worsen or seek emergency care

## 2012-12-13 NOTE — Telephone Encounter (Signed)
I spoke with pt. He believed it was [facility] that called him and told him they would be out today. I called and spoke with robin (after hours service). She stated she will have to contact there driver that delivers this and see what is going on and give pt a call. I advised will f/u on this in AM. Pt is aware as well

## 2012-12-14 ENCOUNTER — Telehealth: Payer: Self-pay | Admitting: Pulmonary Disease

## 2012-12-14 NOTE — Telephone Encounter (Signed)
Spoke with Selena Batten at [facility] and they spoke with pt this morning and Dawn is going out to set up pt for overnight oximetry today.

## 2012-12-14 NOTE — Telephone Encounter (Signed)
I called [facility] [phone]. I spoke with [name]. She stated Steven Beasley was on her way npow I called pt and she just got there. Nothing further needed

## 2012-12-22 ENCOUNTER — Telehealth: Payer: Self-pay | Admitting: Adult Health

## 2012-12-22 ENCOUNTER — Encounter: Payer: Self-pay | Admitting: Adult Health

## 2012-12-22 DIAGNOSIS — J449 Chronic obstructive pulmonary disease, unspecified: Secondary | ICD-10-CM

## 2012-12-22 NOTE — Telephone Encounter (Signed)
8.27.14 ONO Per TP: +desats.  Begin O2 2lpm at bedtime.  ATC pt at home number; no answer and no option to LM. LMOM TCB x1 at cell number.  Order placed for nocturnal O2 thru [facility].

## 2012-12-23 NOTE — Telephone Encounter (Signed)
ATC pt again at home number > no answer and no option to leave message. LMOM TCB x2 on cell number.

## 2012-12-26 NOTE — Telephone Encounter (Signed)
Called [facility], spoke with Selena Batten.  [facility] did set up pt's O2 on 9.5.14 Called spoke with patient, discussed ONO results and recommendations to begin O2 Pt has been wearing O2 at bedtime and with naps Advised pt to continue and call with any questions/concerns

## 2012-12-30 ENCOUNTER — Other Ambulatory Visit: Payer: Medicare Other

## 2013-01-09 ENCOUNTER — Encounter: Payer: Self-pay | Admitting: Pulmonary Disease

## 2013-01-20 ENCOUNTER — Ambulatory Visit (INDEPENDENT_AMBULATORY_CARE_PROVIDER_SITE_OTHER): Payer: Medicare Other | Admitting: Pulmonary Disease

## 2013-01-20 ENCOUNTER — Encounter: Payer: Self-pay | Admitting: Pulmonary Disease

## 2013-01-20 ENCOUNTER — Ambulatory Visit (INDEPENDENT_AMBULATORY_CARE_PROVIDER_SITE_OTHER)
Admission: RE | Admit: 2013-01-20 | Discharge: 2013-01-20 | Disposition: A | Discharge status: Home / Self Care | Payer: Medicare Other | Source: Ambulatory Visit | Attending: Pulmonary Disease | Admitting: Pulmonary Disease

## 2013-01-20 VITALS — BP 124/76 | HR 86 | Ht 70.5 in | Wt 157.4 lb

## 2013-01-20 DIAGNOSIS — Z23 Encounter for immunization: Secondary | ICD-10-CM

## 2013-01-20 DIAGNOSIS — J189 Pneumonia, unspecified organism: Secondary | ICD-10-CM

## 2013-01-20 DIAGNOSIS — J449 Chronic obstructive pulmonary disease, unspecified: Secondary | ICD-10-CM

## 2013-01-20 DIAGNOSIS — R918 Other nonspecific abnormal finding of lung field: Secondary | ICD-10-CM

## 2013-01-20 NOTE — Assessment & Plan Note (Signed)
1m FU CT in dec 2014

## 2013-01-20 NOTE — Assessment & Plan Note (Signed)
Flu shot Stay on Breo daily Use albuterol nebs as needed Stay on oxygen during sleep

## 2013-01-20 NOTE — Assessment & Plan Note (Signed)
Pneumonia is improving CXR today -based on this, we will decide on CT scan in December

## 2013-01-20 NOTE — Patient Instructions (Signed)
Pneumonia is improving CXR today -based on this, we will decide on CT scan in December Flu shot Stay on Breo daily Use albuterol nebs as needed Stay on oxygen during sleep

## 2013-01-20 NOTE — Progress Notes (Signed)
  Subjective:    Patient ID: Steven Beasley, male    DOB: [birth year], 71 y.o.   MRN: [id number]  HPI  72 yo male former smoker, with gold D COPD seen for initial PCCM consult during admission 10/26/12 for hemoptysis.  Hx of smoldering myeloma followed by Dr. Gaylyn Rong.   CT chest showed airspace disease and consolidation in the right middle lung- pneumonia versus aspiration or hemorrhage.  Bronchoscopy >> no endobronchial lesions were noted   FU CT scan today showed a subcentimeter pulmonary nodule in the right upper lung stable since previous study and possibly benign    12/09/2012 PFT  FEV1 21%, +BD response, ratio 40.   01/20/2013 Started on noct O2  Pt reports his breathing is better. He coughs up white foamy phlem occasionally. Denies any wheezing and no chest tx. he reports he has some runny nose. denies any PND. He is using 2 liters O2 at bedtime. Hemoptysis resolved CXR - resolved infiltrate  Review of Systems neg for any significant sore throat, dysphagia, itching, sneezing, nasal congestion or excess/ purulent secretions, fever, chills, sweats, unintended wt loss, pleuritic or exertional cp, hempoptysis, orthopnea pnd or change in chronic leg swelling. Also denies presyncope, palpitations, heartburn, abdominal pain, nausea, vomiting, diarrhea or change in bowel or urinary habits, dysuria,hematuria, rash, arthralgias, visual complaints, headache, numbness weakness or ataxia.     Objective:   Physical Exam  Gen. Pleasant, thin man, in no distress ENT - no lesions, no post nasal drip Neck: No JVD, no thyromegaly, no carotid bruits Lungs: no use of accessory muscles, no dullness to percussion, clear without rales or rhonchi  Cardiovascular: Rhythm regular, heart sounds  normal, no murmurs or gallops, no peripheral edema Musculoskeletal: No deformities, no cyanosis or clubbing        Assessment & Plan:

## 2013-03-31 ENCOUNTER — Other Ambulatory Visit: Payer: Medicare Other

## 2013-04-07 ENCOUNTER — Ambulatory Visit: Payer: Medicare Other | Admitting: Hematology and Oncology

## 2013-04-07 ENCOUNTER — Other Ambulatory Visit: Payer: Medicare Other

## 2013-04-18 ENCOUNTER — Ambulatory Visit (INDEPENDENT_AMBULATORY_CARE_PROVIDER_SITE_OTHER)
Admission: RE | Admit: 2013-04-18 | Discharge: 2013-04-18 | Disposition: A | Discharge status: Home / Self Care | Payer: Medicare Other | Source: Ambulatory Visit | Attending: Pulmonary Disease | Admitting: Pulmonary Disease

## 2013-04-18 DIAGNOSIS — R918 Other nonspecific abnormal finding of lung field: Secondary | ICD-10-CM

## 2013-04-21 ENCOUNTER — Other Ambulatory Visit: Payer: Medicare Other

## 2013-04-24 ENCOUNTER — Ambulatory Visit: Payer: Medicare Other | Admitting: Hematology and Oncology

## 2013-04-26 ENCOUNTER — Other Ambulatory Visit (HOSPITAL_BASED_OUTPATIENT_CLINIC_OR_DEPARTMENT_OTHER): Payer: Medicare Other

## 2013-04-26 DIAGNOSIS — D472 Monoclonal gammopathy: Secondary | ICD-10-CM

## 2013-04-26 DIAGNOSIS — C9000 Multiple myeloma not having achieved remission: Secondary | ICD-10-CM

## 2013-04-26 LAB — COMPREHENSIVE METABOLIC PANEL (CC13)
ALK PHOS: 71 U/L (ref 40–150)
ALT: 30 U/L (ref 0–55)
AST: 32 U/L (ref 5–34)
Albumin: 4.2 g/dL (ref 3.5–5.0)
Anion Gap: 10 mEq/L (ref 3–11)
BILIRUBIN TOTAL: 0.36 mg/dL (ref 0.20–1.20)
BUN: 15.2 mg/dL (ref 7.0–26.0)
CO2: 30 mEq/L — ABNORMAL HIGH (ref 22–29)
CREATININE: 1 mg/dL (ref 0.7–1.3)
Calcium: 10.1 mg/dL (ref 8.4–10.4)
Chloride: 97 mEq/L — ABNORMAL LOW (ref 98–109)
Glucose: 90 mg/dl (ref 70–140)
Potassium: 3.8 mEq/L (ref 3.5–5.1)
SODIUM: 137 meq/L (ref 136–145)
TOTAL PROTEIN: 7.5 g/dL (ref 6.4–8.3)

## 2013-04-26 LAB — CBC WITH DIFFERENTIAL/PLATELET
BASO%: 0.5 % (ref 0.0–2.0)
BASOS ABS: 0 10*3/uL (ref 0.0–0.1)
EOS%: 3.9 % (ref 0.0–7.0)
Eosinophils Absolute: 0.1 10*3/uL (ref 0.0–0.5)
HEMATOCRIT: 35.6 % — AB (ref 38.4–49.9)
HEMOGLOBIN: 12.1 g/dL — AB (ref 13.0–17.1)
LYMPH#: 1 10*3/uL (ref 0.9–3.3)
LYMPH%: 32.3 % (ref 14.0–49.0)
MCH: 33.6 pg — AB (ref 27.2–33.4)
MCHC: 34.1 g/dL (ref 32.0–36.0)
MCV: 98.6 fL — AB (ref 79.3–98.0)
MONO#: 0.6 10*3/uL (ref 0.1–0.9)
MONO%: 17.4 % — AB (ref 0.0–14.0)
NEUT#: 1.5 10*3/uL (ref 1.5–6.5)
NEUT%: 45.9 % (ref 39.0–75.0)
PLATELETS: 184 10*3/uL (ref 140–400)
RBC: 3.61 10*6/uL — ABNORMAL LOW (ref 4.20–5.82)
RDW: 13.9 % (ref 11.0–14.6)
WBC: 3.2 10*3/uL — ABNORMAL LOW (ref 4.0–10.3)

## 2013-04-27 NOTE — Progress Notes (Signed)
Quick Note:  ATC home #, rang several times with no voicemail. Called cell #, went straight to voicemail, LMTCB X2. ______

## 2013-04-28 ENCOUNTER — Ambulatory Visit (INDEPENDENT_AMBULATORY_CARE_PROVIDER_SITE_OTHER): Payer: Medicare Other | Admitting: Adult Health

## 2013-04-28 ENCOUNTER — Encounter: Payer: Self-pay | Admitting: Adult Health

## 2013-04-28 ENCOUNTER — Encounter: Payer: Self-pay | Admitting: *Deleted

## 2013-04-28 VITALS — BP 140/82 | HR 92 | Temp 97.4°F | Ht 71.0 in | Wt 163.8 lb

## 2013-04-28 DIAGNOSIS — J449 Chronic obstructive pulmonary disease, unspecified: Secondary | ICD-10-CM

## 2013-04-28 DIAGNOSIS — R918 Other nonspecific abnormal finding of lung field: Secondary | ICD-10-CM

## 2013-04-28 LAB — PROTEIN ELECTROPHORESIS, SERUM
ALPHA-1-GLOBULIN: 4.3 % (ref 2.9–4.9)
ALPHA-2-GLOBULIN: 10.2 % (ref 7.1–11.8)
Albumin ELP: 59.3 % (ref 55.8–66.1)
BETA GLOBULIN: 5 % (ref 4.7–7.2)
Beta 2: 4 % (ref 3.2–6.5)
GAMMA GLOBULIN: 17.2 % (ref 11.1–18.8)
M-Spike, %: 0.96 g/dL
Total Protein, Serum Electrophoresis: 7.2 g/dL (ref 6.0–8.3)

## 2013-04-28 LAB — KAPPA/LAMBDA LIGHT CHAINS
KAPPA LAMBDA RATIO: 5.22 — AB (ref 0.26–1.65)
Kappa free light chain: 6.05 mg/dL — ABNORMAL HIGH (ref 0.33–1.94)
Lambda Free Lght Chn: 1.16 mg/dL (ref 0.57–2.63)

## 2013-04-28 MED ORDER — FLUTICASONE FUROATE-VILANTEROL 100-25 MCG/INH IN AEPB: 1.0000 | INHALATION_SPRAY | Freq: Every day | RESPIRATORY_TRACT | Status: DC

## 2013-04-28 NOTE — Progress Notes (Signed)
  Subjective:    Patient ID: Steven Beasley, male    DOB: 12-19-1940, 73 y.o.   MRN: [id number]  HPI   73 yo male former smoker, with gold D COPD seen for initial PCCM consult during admission 10/26/12 for hemoptysis.  Hx of smoldering myeloma followed by Dr. Lamonte Sakai.   CT chest showed airspace disease and consolidation in the right middle lung- pneumonia versus aspiration or hemorrhage.  Bronchoscopy >> no endobronchial lesions were noted   FU CT scan today showed a subcentimeter pulmonary nodule in the right upper lung stable since previous study and possibly benign    12/09/2012 PFT  FEV1 21%, +BD response, ratio 40.   01/20/13 Started on noct O2  Pt reports his breathing is better. He coughs up white foamy phlem occasionally. Denies any wheezing and no chest tx. he reports he has some runny nose. denies any PND. He is using 2 liters O2 at bedtime. Hemoptysis resolved CXR - resolved infiltrate >>no changes   04/28/2013 Follow up COPD and Lung Nodules  Patient returns for a three-month followup He says that he has been doing well. Denies any flare of cough or wheezing. He is currently on Breo daily  He continues on oxygen at 2 L at bedtime. Does complain of a stuffy nose and postnasal drainage CT 04/27/13 >resolved PNA. Stable nodules since 2012-c/w benign etiology .  He denies any hemoptysis, orthopnea, PND, leg swelling, or unintentional weight loss.  Review of Systems  neg for any significant sore throat, dysphagia, itching, sneezing, nasal congestion or excess/ purulent secretions, fever, chills, sweats, unintended wt loss, pleuritic or exertional cp, hempoptysis, orthopnea pnd or change in chronic leg swelling. Also denies presyncope, palpitations, heartburn, abdominal pain, nausea, vomiting, diarrhea or change in bowel or urinary habits, dysuria,hematuria, rash, arthralgias, visual complaints, headache, numbness weakness or ataxia.     Objective:   Physical Exam   Gen. Pleasant,  thin man, in no distress ENT - no lesions, no post nasal drip Neck: No JVD, no thyromegaly, no carotid bruits Lungs: no use of accessory muscles, no dullness to percussion, clear without rales or rhonchi  Cardiovascular: Rhythm regular, heart sounds  normal, no murmurs or gallops, no peripheral edema Musculoskeletal: No deformities, no cyanosis or clubbing        Assessment & Plan:

## 2013-04-28 NOTE — Assessment & Plan Note (Addendum)
Doing well on Breo  Mild rhinitis   Plan   Stay on Breo daily May use Saline nasal rinses As needed   Claritin 10mg  daily As needed  Drainage .  Use albuterol nebs as needed Stay on oxygen during sleep Follow up Dr. Elsworth Soho  In 4-6 months and As needed   Please contact office for sooner follow up if symptoms do not improve or worsen or seek emergency care

## 2013-04-28 NOTE — Patient Instructions (Addendum)
Your CT scan of your lungs shows  That the Pneumonia is resolved and lungs nodules are considered benign .  Stay on Breo daily May use Saline nasal rinses As needed   Claritin 10mg  daily As needed  Drainage .  Use albuterol nebs as needed Stay on oxygen during sleep Follow up Dr. Elsworth Soho  In 4-6 months and As needed   Please contact office for sooner follow up if symptoms do not improve or worsen or seek emergency care

## 2013-04-28 NOTE — Assessment & Plan Note (Addendum)
CT shows >Mild residual post infectious/inflammatory scarring in the right  middle lobe. Prior multifocal pneumonia has otherwise resolved.  Scattered pulmonary nodules measuring up to 4 mm in the right upper  lobe, unchanged from 2012, benign.   >reviewed results in detail  No further CT indicated.

## 2013-05-02 ENCOUNTER — Encounter: Payer: Self-pay | Admitting: Hematology and Oncology

## 2013-05-02 ENCOUNTER — Ambulatory Visit (HOSPITAL_BASED_OUTPATIENT_CLINIC_OR_DEPARTMENT_OTHER): Payer: Medicare Other | Admitting: Hematology and Oncology

## 2013-05-02 VITALS — BP 148/82 | HR 104 | Temp 97.9°F | Resp 19 | Ht 71.0 in | Wt 162.4 lb

## 2013-05-02 DIAGNOSIS — D472 Monoclonal gammopathy: Secondary | ICD-10-CM

## 2013-05-02 DIAGNOSIS — D649 Anemia, unspecified: Secondary | ICD-10-CM

## 2013-05-02 DIAGNOSIS — C9000 Multiple myeloma not having achieved remission: Secondary | ICD-10-CM

## 2013-05-02 NOTE — Progress Notes (Signed)
[facility] OFFICE PROGRESS NOTE  Patient Care Team: Murlean Iba, MD as PCP - General (Family Medicine) Nobie Putnam, MD (Hematology and Oncology)  DIAGNOSIS: Smoldering myeloma, kappa light chain subtype  SUMMARY OF ONCOLOGIC HISTORY: This is a pleasant 88 your gentleman who was diagnosed with smoldering myeloma since December of 2012. He had bone marrow biopsy in December 2012 and again in November 2013 which show a 213% plasma cell involvement. Skeletal survey showed no lytic lesions and blood work did not show hypercalcemia, or renal failure. He has mild anemia  INTERVAL HISTORY: Steven Beasley 73 y.o. male returns for further followup. He has chronic right hip pain that is unchanged compared to baseline. Denies any recent infection. No recent night sweats.  I have reviewed the past medical history, past surgical history, social history and family history with the patient and they are unchanged from previous note.  ALLERGIES:  has No Known Allergies.  MEDICATIONS:  Current Outpatient Prescriptions  Medication Sig Dispense Refill  . albuterol (PROVENTIL HFA;VENTOLIN HFA) 108 (90 BASE) MCG/ACT inhaler Inhale 2 puffs into the lungs every 6 (six) hours as needed for wheezing.      Marland Kitchen albuterol (PROVENTIL) (2.5 MG/3ML) 0.083% nebulizer solution Take 3 mLs (2.5 mg total) by nebulization every 4 (four) hours as needed for wheezing or shortness of breath (dx 786.2).  180 vial  6  . amLODipine (NORVASC) 10 MG tablet Take 1 tablet (10 mg total) by mouth daily.  30 tablet  4  . esomeprazole (NEXIUM) 40 MG capsule Take 40 mg by mouth daily before breakfast.        . fluticasone (FLONASE) 50 MCG/ACT nasal spray Place 2 sprays into the nose daily.      . Fluticasone Furoate-Vilanterol (BREO ELLIPTA) 100-25 MCG/INH AEPB Inhale 1 puff into the lungs daily.  1 each  6  . gabapentin (NEURONTIN) 300 MG capsule Take 300 mg by mouth 3 (three) times daily.        . pentosan  polysulfate (ELMIRON) 100 MG capsule Take 100 mg by mouth 3 (three) times daily before meals.       . Tamsulosin HCl (FLOMAX) 0.4 MG CAPS Take 0.4 mg by mouth daily.       Marland Kitchen venlafaxine (EFFEXOR-XR) 150 MG 24 hr capsule Take 150 mg by mouth daily.         No current facility-administered medications for this visit.    REVIEW OF SYSTEMS:   Constitutional: Denies fevers, chills or abnormal weight loss Eyes: Denies blurriness of vision Ears, nose, mouth, throat, and face: Denies mucositis or sore throat Respiratory: Denies cough, dyspnea or wheezes Cardiovascular: Denies palpitation, chest discomfort or lower extremity swelling Gastrointestinal:  Denies nausea, heartburn or change in bowel habits Skin: Denies abnormal skin rashes Lymphatics: Denies new lymphadenopathy or easy bruising Neurological:Denies numbness, tingling or new weaknesses Behavioral/Psych: Mood is stable, no new changes  All other systems were reviewed with the patient and are negative.  PHYSICAL EXAMINATION: ECOG PERFORMANCE STATUS: 1 - Symptomatic but completely ambulatory  Filed Vitals:   05/02/13 0814  BP: 148/82  Pulse: 104  Temp: 97.9 F (36.6 C)  Resp: 19   Filed Weights   05/02/13 0814  Weight: 162 lb 6.4 oz (73.664 kg)    GENERAL:alert, no distress and comfortable SKIN: skin color, texture, turgor are normal, no rashes or significant lesions EYES: normal, Conjunctiva are pink and non-injected, sclera clear OROPHARYNX:no exudate, no erythema and lips, buccal mucosa, and  tongue normal  NECK: supple, thyroid normal size, non-tender, without nodularity LYMPH:  no palpable lymphadenopathy in the cervical, axillary or inguinal LUNGS: clear to auscultation and percussion with normal breathing effort HEART: regular rate & rhythm and no murmurs and no lower extremity edema ABDOMEN:abdomen soft, non-tender and normal bowel sounds Musculoskeletal:no cyanosis of digits and no clubbing  NEURO: alert &  oriented x 3 with fluent speech, no focal motor/sensory deficits  LABORATORY DATA:  I have reviewed the data as listed    Component Value Date/Time   NA 137 04/26/2013 0755   NA 137 10/27/2012 0420   K 3.8 04/26/2013 0755   K 3.8 10/27/2012 0420   CL 99 10/27/2012 0420   CL 98 09/22/2012 0859   CO2 30* 04/26/2013 0755   CO2 29 10/27/2012 0420   GLUCOSE 90 04/26/2013 0755   GLUCOSE 105* 10/27/2012 0420   GLUCOSE 100* 09/22/2012 0859   BUN 15.2 04/26/2013 0755   BUN 13 10/27/2012 0420   CREATININE 1.0 04/26/2013 0755   CREATININE 0.87 10/27/2012 0420   CALCIUM 10.1 04/26/2013 0755   CALCIUM 9.0 10/27/2012 0420   CALCIUM 9.9 03/18/2012 1552   PROT 7.5 04/26/2013 0755   PROT 6.9 10/13/2011 1223   ALBUMIN 4.2 04/26/2013 0755   ALBUMIN 4.6 10/13/2011 1223   AST 32 04/26/2013 0755   AST 29 10/13/2011 1223   ALT 30 04/26/2013 0755   ALT 19 10/13/2011 1223   ALKPHOS 71 04/26/2013 0755   ALKPHOS 72 10/13/2011 1223   BILITOT 0.36 04/26/2013 0755   BILITOT 0.5 10/13/2011 1223   GFRNONAA 85* 10/27/2012 0420   GFRAA >90 10/27/2012 0420    No results found for this basename: SPEP, UPEP,  kappa and lambda light chains    Lab Results  Component Value Date   WBC 3.2* 04/26/2013   NEUTROABS 1.5 04/26/2013   HGB 12.1* 04/26/2013   HCT 35.6* 04/26/2013   MCV 98.6* 04/26/2013   PLT 184 04/26/2013      Chemistry      Component Value Date/Time   NA 137 04/26/2013 0755   NA 137 10/27/2012 0420   K 3.8 04/26/2013 0755   K 3.8 10/27/2012 0420   CL 99 10/27/2012 0420   CL 98 09/22/2012 0859   CO2 30* 04/26/2013 0755   CO2 29 10/27/2012 0420   BUN 15.2 04/26/2013 0755   BUN 13 10/27/2012 0420   CREATININE 1.0 04/26/2013 0755   CREATININE 0.87 10/27/2012 0420      Component Value Date/Time   CALCIUM 10.1 04/26/2013 0755   CALCIUM 9.0 10/27/2012 0420   CALCIUM 9.9 03/18/2012 1552   ALKPHOS 71 04/26/2013 0755   ALKPHOS 72 10/13/2011 1223   AST 32 04/26/2013 0755   AST 29 10/13/2011 1223   ALT 30 04/26/2013 0755   ALT 19 10/13/2011 1223   BILITOT 0.36  04/26/2013 0755   BILITOT 0.5 10/13/2011 1223     ASSESSMENT & PLAN:  #1 smoldering myeloma The patient over is not symptomatic. There is little change of his blood work over the last 2 years. I will continues to see him on a yearly basis. #2 preventive care I recommend vitamin D supplements. I recommend influenza vaccination today but the patient declined #3 mild anemia It could be related to his myeloma on the new chronic disease. The patient has no symptoms. We will observe only.  Orders Placed This Encounter  Procedures  . CBC with Differential    Standing Status: Future  Number of Occurrences:      Standing Expiration Date: 01/22/2014  . Comprehensive metabolic panel    Standing Status: Future     Number of Occurrences:      Standing Expiration Date: 05/02/2014  . Beta 2 microglobuline, serum    Standing Status: Future     Number of Occurrences:      Standing Expiration Date: 05/02/2014  . SPEP & IFE with QIG    Standing Status: Future     Number of Occurrences:      Standing Expiration Date: 05/02/2014  . Kappa/lambda light chains    Standing Status: Future     Number of Occurrences:      Standing Expiration Date: 05/02/2014   All questions were answered. The patient knows to call the clinic with any problems, questions or concerns. No barriers to learning was detected. I spent 15 minutes counseling the patient face to face. The total time spent in the appointment was 20 minutes and more than 50% was on counseling and review of test results     [facility], [place], MD 05/02/2013 8:45 AM

## 2013-05-03 ENCOUNTER — Telehealth: Payer: Self-pay | Admitting: Pulmonary Disease

## 2013-05-03 NOTE — Telephone Encounter (Signed)
Pt returned call.  Spoke with patient.  Pt was advised of CT results when he saw TP on 1.9.15.  Pt has no questions concerning his results and will keep 4-6 month follow up w/ RA as recommended.  Nothing further needed; will sign off.  Patient Instructions     Your CT scan of your lungs shows That the Pneumonia is resolved and lungs nodules are considered benign .  Stay on Breo daily  May use Saline nasal rinses As needed  Claritin 10mg  daily As needed Drainage .  Use albuterol nebs as needed  Stay on oxygen during sleep  Follow up Dr. Elsworth Soho In 4-6 months and As needed  Please contact office for sooner follow up if symptoms do not improve or worsen or seek emergency care

## 2013-05-03 NOTE — Telephone Encounter (Signed)
ATC PT line busy x 4 wcb  

## 2013-05-03 NOTE — Telephone Encounter (Signed)
Notes Recorded by Rigoberto Noel, MD on 04/18/2013 at 6:09 PM Changes of pna have resolved nodules stable since 2012   Called pt at # listed to call. It rings several times and no VM Called mobile # and LMTCB

## 2013-05-04 ENCOUNTER — Telehealth: Payer: Self-pay | Admitting: Hematology and Oncology

## 2013-05-04 NOTE — Telephone Encounter (Signed)
lmonvm advising the pt of his jan 2016 appt calendar.

## 2013-05-09 ENCOUNTER — Telehealth: Payer: Self-pay | Admitting: Pulmonary Disease

## 2013-05-09 ENCOUNTER — Other Ambulatory Visit: Payer: Self-pay

## 2013-05-09 MED ORDER — AMLODIPINE BESYLATE 10 MG PO TABS: 10.0000 mg | ORAL_TABLET | Freq: Every day | ORAL | Status: DC

## 2013-05-09 NOTE — Telephone Encounter (Signed)
Spoke with pt. Advised pt according to EPIC rx was already sent today by another doc. He voiced his understanding and needed nothing further

## 2013-06-13 ENCOUNTER — Other Ambulatory Visit: Payer: Self-pay | Admitting: Family Medicine

## 2013-06-15 ENCOUNTER — Other Ambulatory Visit: Payer: Self-pay | Admitting: Family Medicine

## 2013-06-19 ENCOUNTER — Telehealth: Payer: Self-pay | Admitting: Pulmonary Disease

## 2013-06-19 MED ORDER — ALBUTEROL SULFATE HFA 108 (90 BASE) MCG/ACT IN AERS: 2.0000 | INHALATION_SPRAY | Freq: Four times a day (QID) | RESPIRATORY_TRACT | Status: DC | PRN

## 2013-06-19 MED ORDER — ESOMEPRAZOLE MAGNESIUM 40 MG PO CPDR: 40.0000 mg | DELAYED_RELEASE_CAPSULE | Freq: Every day | ORAL | Status: DC

## 2013-06-19 NOTE — Telephone Encounter (Signed)
OK to refill albuterol x 3  nexium x 1

## 2013-06-19 NOTE — Telephone Encounter (Signed)
Spoke with pt. He is wanting refills on Ventolin and Nexium. According to his chart, we haven't prescribed these medications for the pt. Advised the pt of this and he states that he was getting them from the "free clinic." This clinic has shut down and he can't get his medicine. Advised pt that we will have to check with RA before we can fill these for him.  RA - please advise. Thanks.

## 2013-06-19 NOTE — Telephone Encounter (Signed)
Rx's have been sent in per RA. Attempted to call patient, no answer. Will try back later.

## 2013-06-20 NOTE — Telephone Encounter (Signed)
ATC home number, NA, no voicemail. Cell number not accepting incoming calls.  I called pharmacy to check to see if the pt picked up meds yet and he has not. [place] Bing, CMA

## 2013-06-21 NOTE — Telephone Encounter (Signed)
ATC home number again, NA, no voicemail. Cell # not working #. Medications have been sent to the pharmacy. I will sign off on message and await call back per protocol. [place] Bing, CMA

## 2013-06-26 ENCOUNTER — Telehealth: Payer: Self-pay | Admitting: Adult Health

## 2013-06-26 NOTE — Telephone Encounter (Signed)
Spoke w/ pt. He is wanting [facility] # to call so they can look at his neb machine. He is not sure if it is working properly. Gave ghim #. Nothing further needed

## 2013-07-05 ENCOUNTER — Other Ambulatory Visit: Payer: Self-pay | Admitting: Internal Medicine

## 2013-09-06 ENCOUNTER — Ambulatory Visit: Payer: Medicare Other | Admitting: Pulmonary Disease

## 2013-10-04 ENCOUNTER — Other Ambulatory Visit: Payer: Self-pay | Admitting: Pulmonary Disease

## 2013-10-06 ENCOUNTER — Other Ambulatory Visit: Payer: Self-pay | Admitting: Pulmonary Disease

## 2013-10-23 ENCOUNTER — Ambulatory Visit (INDEPENDENT_AMBULATORY_CARE_PROVIDER_SITE_OTHER): Payer: Medicare HMO | Admitting: Pulmonary Disease

## 2013-10-23 ENCOUNTER — Encounter: Payer: Self-pay | Admitting: Pulmonary Disease

## 2013-10-23 VITALS — BP 120/78 | HR 93 | Ht 73.0 in | Wt 159.6 lb

## 2013-10-23 DIAGNOSIS — J449 Chronic obstructive pulmonary disease, unspecified: Secondary | ICD-10-CM

## 2013-10-23 MED ORDER — FLUTICASONE FUROATE-VILANTEROL 100-25 MCG/INH IN AEPB: 1.0000 | INHALATION_SPRAY | Freq: Every day | RESPIRATORY_TRACT | Status: DC

## 2013-10-23 NOTE — Patient Instructions (Signed)
Refills on Breo We will check your oxygen levels during sleep & decide whether you still need oxygen Take mucinex daily for congestion Call us if you ever catch a chest cold

## 2013-10-23 NOTE — Assessment & Plan Note (Addendum)
Refills on Breo Could add spiriva, but he prefers to be on less meds We will check your oxygen levels during sleep & decide whether you still need oxygen - try to avoid O2 on exertion, he will likely need this if flare Take mucinex daily for congestion Call us if you ever catch a chest cold

## 2013-10-23 NOTE — Progress Notes (Signed)
   Subjective:    Patient ID: Steven Beasley, male    DOB: 1941/03/13, 73 y.o.   MRN: [id number]  HPI  73 yo male former smoker, with gold D COPD  Hx of smoldering myeloma.  Significant tests/ events  Admission 10/26/12 for hemoptysis.  CT chest showed airspace disease and consolidation in the right middle lung- pneumonia versus aspiration or hemorrhage.  Bronchoscopy >> no endobronchial lesions were noted   CT 04/27/13 >resolved PNA. Stable nodules since 2012-c/w benign etiology .  12/09/2012 PFT FEV1 21%, +BD response, ratio 40.   10/23/2013  Chief Complaint  Patient presents with  . Follow-up    Pt reports that breathing doing well and no complaints.  States wearing O2 at night but [facility] stated that insurance will no longer cover and it will be picked up.   Desatn to 87% on 3rd lap, HR read high on pulse ox, but manual 104/m  He says that he has been doing well. Denies any flare of cough or wheezing. He is currently on Breo daily  He continues on oxygen at 2 L at bedtime.  C/o congestion in thrpat & feels like he has phlegm  He denies any hemoptysis, orthopnea, PND, leg swelling, or unintentional weight loss.    Review of Systems neg for any significant sore throat, dysphagia, itching, sneezing, nasal congestion or excess/ purulent secretions, fever, chills, sweats, unintended wt loss, pleuritic or exertional cp, hempoptysis, orthopnea pnd or change in chronic leg swelling. Also denies presyncope, palpitations, heartburn, abdominal pain, nausea, vomiting, diarrhea or change in bowel or urinary habits, dysuria,hematuria, rash, arthralgias, visual complaints, headache, numbness weakness or ataxia.     Objective:   Physical Exam  Gen. Pleasant, tall, thin,, in no distress ENT - no lesions, no post nasal drip Neck: No JVD, no thyromegaly, no carotid bruits Lungs: no use of accessory muscles, no dullness to percussion, decreased without rales or rhonchi  Cardiovascular: Rhythm  regular, heart sounds  normal, no murmurs or gallops, no peripheral edema Musculoskeletal: No deformities, no cyanosis or clubbing        Assessment & Plan:

## 2013-11-09 ENCOUNTER — Telehealth: Payer: Self-pay | Admitting: Pulmonary Disease

## 2013-11-09 NOTE — Telephone Encounter (Signed)
10/26/13 ONO on RA - 9 min desatn OK to come off noct O2 & call us in 2 weeks to report, if OK will dc O2

## 2013-11-09 NOTE — Telephone Encounter (Signed)
I spoke with patient about results and he verbalized understanding and had no questions 

## 2013-11-14 ENCOUNTER — Other Ambulatory Visit: Payer: Self-pay | Admitting: Pulmonary Disease

## 2013-11-16 ENCOUNTER — Encounter: Payer: Self-pay | Admitting: Pulmonary Disease

## 2013-12-04 ENCOUNTER — Telehealth: Payer: Self-pay | Admitting: Pulmonary Disease

## 2013-12-04 DIAGNOSIS — J449 Chronic obstructive pulmonary disease, unspecified: Secondary | ICD-10-CM

## 2013-12-04 NOTE — Telephone Encounter (Signed)
Spoke with pt .  He states he doing fine off oxygen and wants [facility] to come pick up oxygen up.  Dr Elsworth Soho, please advise if ok to have them come pick up O2

## 2013-12-05 NOTE — Telephone Encounter (Signed)
Ok to dc O2 

## 2013-12-05 NOTE — Telephone Encounter (Signed)
Order has been placed to d/c O2 to [facility] Called pt line rang numerous times and no answer, no VM WCB

## 2013-12-06 NOTE — Telephone Encounter (Signed)
Pt advised. [name], CMA  

## 2013-12-30 ENCOUNTER — Encounter (HOSPITAL_COMMUNITY): Payer: Self-pay | Admitting: Emergency Medicine

## 2013-12-30 ENCOUNTER — Emergency Department (HOSPITAL_COMMUNITY)
Admission: EM | Admit: 2013-12-30 | Discharge: 2013-12-30 | Disposition: A | Discharge status: Home / Self Care | Payer: No Typology Code available for payment source | Attending: Emergency Medicine | Admitting: Emergency Medicine

## 2013-12-30 DIAGNOSIS — Y9389 Activity, other specified: Secondary | ICD-10-CM | POA: Insufficient documentation

## 2013-12-30 DIAGNOSIS — K219 Gastro-esophageal reflux disease without esophagitis: Secondary | ICD-10-CM | POA: Insufficient documentation

## 2013-12-30 DIAGNOSIS — Z87898 Personal history of other specified conditions: Secondary | ICD-10-CM | POA: Insufficient documentation

## 2013-12-30 DIAGNOSIS — Z87891 Personal history of nicotine dependence: Secondary | ICD-10-CM | POA: Diagnosis not present

## 2013-12-30 DIAGNOSIS — G8929 Other chronic pain: Secondary | ICD-10-CM | POA: Diagnosis not present

## 2013-12-30 DIAGNOSIS — S199XXA Unspecified injury of neck, initial encounter: Secondary | ICD-10-CM

## 2013-12-30 DIAGNOSIS — Y92410 Unspecified street and highway as the place of occurrence of the external cause: Secondary | ICD-10-CM | POA: Insufficient documentation

## 2013-12-30 DIAGNOSIS — Z862 Personal history of diseases of the blood and blood-forming organs and certain disorders involving the immune mechanism: Secondary | ICD-10-CM | POA: Insufficient documentation

## 2013-12-30 DIAGNOSIS — S0993XA Unspecified injury of face, initial encounter: Secondary | ICD-10-CM | POA: Diagnosis present

## 2013-12-30 DIAGNOSIS — F411 Generalized anxiety disorder: Secondary | ICD-10-CM | POA: Diagnosis not present

## 2013-12-30 DIAGNOSIS — N400 Enlarged prostate without lower urinary tract symptoms: Secondary | ICD-10-CM | POA: Insufficient documentation

## 2013-12-30 DIAGNOSIS — S139XXA Sprain of joints and ligaments of unspecified parts of neck, initial encounter: Secondary | ICD-10-CM | POA: Diagnosis not present

## 2013-12-30 DIAGNOSIS — IMO0002 Reserved for concepts with insufficient information to code with codable children: Secondary | ICD-10-CM | POA: Insufficient documentation

## 2013-12-30 DIAGNOSIS — S161XXA Strain of muscle, fascia and tendon at neck level, initial encounter: Secondary | ICD-10-CM

## 2013-12-30 DIAGNOSIS — Z79899 Other long term (current) drug therapy: Secondary | ICD-10-CM | POA: Insufficient documentation

## 2013-12-30 DIAGNOSIS — J45909 Unspecified asthma, uncomplicated: Secondary | ICD-10-CM | POA: Diagnosis not present

## 2013-12-30 DIAGNOSIS — I10 Essential (primary) hypertension: Secondary | ICD-10-CM | POA: Diagnosis not present

## 2013-12-30 MED ORDER — DIAZEPAM 5 MG PO TABS: 5.0000 mg | ORAL_TABLET | Freq: Two times a day (BID) | ORAL | Status: DC

## 2013-12-30 MED ORDER — ACETAMINOPHEN 325 MG PO TABS
650.0000 mg | ORAL_TABLET | Freq: Once | ORAL | Status: AC
  Administered 2013-12-30: 650 mg via ORAL
  Filled 2013-12-30: qty 2

## 2013-12-30 MED ORDER — DIAZEPAM 5 MG PO TABS
5.0000 mg | ORAL_TABLET | Freq: Once | ORAL | Status: AC
  Administered 2013-12-30: 5 mg via ORAL
  Filled 2013-12-30: qty 1

## 2013-12-30 MED ORDER — IBUPROFEN 800 MG PO TABS: 800.0000 mg | ORAL_TABLET | Freq: Three times a day (TID) | ORAL | Status: DC

## 2013-12-30 NOTE — Discharge Instructions (Signed)
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
## 2014-01-15 ENCOUNTER — Other Ambulatory Visit: Payer: Self-pay | Admitting: Pulmonary Disease

## 2014-03-09 ENCOUNTER — Telehealth: Payer: Self-pay | Admitting: Pulmonary Disease

## 2014-03-09 ENCOUNTER — Other Ambulatory Visit: Payer: Self-pay | Admitting: Pulmonary Disease

## 2014-03-09 DIAGNOSIS — R059 Cough, unspecified: Secondary | ICD-10-CM

## 2014-03-09 DIAGNOSIS — R05 Cough: Secondary | ICD-10-CM

## 2014-03-09 MED ORDER — ALBUTEROL SULFATE (2.5 MG/3ML) 0.083% IN NEBU: 2.5000 mg | INHALATION_SOLUTION | RESPIRATORY_TRACT | Status: DC | PRN

## 2014-03-09 MED ORDER — ALBUTEROL SULFATE HFA 108 (90 BASE) MCG/ACT IN AERS: INHALATION_SPRAY | RESPIRATORY_TRACT | Status: DC

## 2014-03-09 NOTE — Telephone Encounter (Signed)
ATC pt X 4. Line busy. WCB.

## 2014-03-09 NOTE — Telephone Encounter (Signed)
Patient requesting refill to be sent to [facility].  Called pharmacy, pt had contacted pharmacy to see if we have sent in RX yet.  Rx sent.  Pharmacy will call patient to notify when ready.

## 2014-03-16 ENCOUNTER — Telehealth: Payer: Self-pay | Admitting: Pulmonary Disease

## 2014-03-16 ENCOUNTER — Telehealth: Payer: Self-pay | Admitting: *Deleted

## 2014-03-16 DIAGNOSIS — R059 Cough, unspecified: Secondary | ICD-10-CM

## 2014-03-16 DIAGNOSIS — R05 Cough: Secondary | ICD-10-CM

## 2014-03-16 MED ORDER — ALBUTEROL SULFATE (2.5 MG/3ML) 0.083% IN NEBU: 2.5000 mg | INHALATION_SOLUTION | RESPIRATORY_TRACT | Status: DC | PRN

## 2014-03-16 NOTE — Telephone Encounter (Signed)
I spoke with the pt and he states he needs a refill on albuterol neb medication. According to phone note from 03/09/14 rx [facility] but pt states this needs to be sent to [facility]. I cannot print Rx from front computer. Please printe Rx and have MD sign and have it faxed to [facility]. Thanks!! Pt is aware. Redby Bing, CMA

## 2014-03-16 NOTE — Telephone Encounter (Signed)
RX printed and placed in RA look at for him to sign. Once done this will need to be faxed to [facility]. Will forward to [place] to f/u on

## 2014-03-16 NOTE — Telephone Encounter (Signed)
Error

## 2014-03-19 ENCOUNTER — Telehealth: Payer: Self-pay | Admitting: Pulmonary Disease

## 2014-03-19 DIAGNOSIS — R059 Cough, unspecified: Secondary | ICD-10-CM

## 2014-03-19 DIAGNOSIS — R05 Cough: Secondary | ICD-10-CM

## 2014-03-19 NOTE — Telephone Encounter (Signed)
Rx signed.

## 2014-03-19 NOTE — Telephone Encounter (Signed)
Pt calling to check on status of prescipt, says it hasn"t been delivered.Steven Beasley

## 2014-03-19 NOTE — Telephone Encounter (Signed)
[facility] is closed as of 5:30pm Will need to be called tomorrow morning to verify above message

## 2014-03-19 NOTE — Telephone Encounter (Signed)
Rx faxed to [facility].  Patient notified.  Nothing further needed.

## 2014-03-19 NOTE — Telephone Encounter (Signed)
ATC to advise the pt that we had to wait for Dr. Elsworth Soho to be in the office to sign the RX, pt was advised this on Friday as well. No answer, no voicemail. I will send message back to [facility] to have her gt Rx from Dr. Elsworth Soho. [place] Bing, CMA

## 2014-03-20 NOTE — Telephone Encounter (Signed)
Spoke with Iris at [facility] and she states pt has been with [facility] since 09/07/13 due to change to [facility]. ATC pt to verify and discuss rx for Albuterol. - Line busy - [facility]

## 2014-03-21 ENCOUNTER — Other Ambulatory Visit: Payer: Self-pay | Admitting: Pulmonary Disease

## 2014-03-22 NOTE — Telephone Encounter (Signed)
Kim call back. She reports they picked their O2 up from pt in May. The referral was sent to [facility]. According to [facility] they never set pt up with O2. They did ONO on pt but never heard back on results.  Per Maudie Mercury with [facility], he has to go through [place] since he has [place].  ATC PT NA WCB

## 2014-03-22 NOTE — Telephone Encounter (Signed)
Per 03/16/14 phone note, pt called requesting RX be sent to [facility].  ATC PT NA, line rang several times and no answer, no VM.  Called spoke with [facility] and was told they have only done ONO on pt back in July but have not supplied pt with any medications or O2. Called [facility] back and spoke with Maudie Mercury. She reports pt changed to insurance to [facility] and faxed all the info to [facility] May of 2014.  Maudie Mercury is going to check into this and call us back.

## 2014-03-26 MED ORDER — ALBUTEROL SULFATE (2.5 MG/3ML) 0.083% IN NEBU: 2.5000 mg | INHALATION_SOLUTION | RESPIRATORY_TRACT | Status: DC | PRN

## 2014-03-26 NOTE — Telephone Encounter (Signed)
ON 12-05-13 an order was sent to discontinue oxygen through [facility] per Dr. Elsworth Soho. The oxygen was picked up by [facility] because of this order not because he was switching to [place]. The pt does not need oxygen.  He only needs rx for albuterol to be sent to [place] because of insurance. Rx printed for Dr. Elsworth Soho to sign tomorrow, it was placed in his look-at. Order placed. I ATC the pt to advise, NA, no voicemail. Steven Beasley, CMA

## 2014-03-30 NOTE — Telephone Encounter (Signed)
ATC to notify the pt that rx was sent to [facility]. NA, no voicemail. WCB.[place] Bing, CMA

## 2014-04-02 NOTE — Telephone Encounter (Signed)
Spoke with pt.  Advised albuterol rx sent to [facility] per below msg.  He verbalized understanding and voiced no further questions or concerns at this time.

## 2014-04-11 ENCOUNTER — Telehealth: Payer: Self-pay | Admitting: Pulmonary Disease

## 2014-04-11 DIAGNOSIS — R059 Cough, unspecified: Secondary | ICD-10-CM

## 2014-04-11 DIAGNOSIS — R05 Cough: Secondary | ICD-10-CM

## 2014-04-11 MED ORDER — ALBUTEROL SULFATE (2.5 MG/3ML) 0.083% IN NEBU: 2.5000 mg | INHALATION_SOLUTION | RESPIRATORY_TRACT | Status: DC | PRN

## 2014-04-11 MED ORDER — ALBUTEROL SULFATE HFA 108 (90 BASE) MCG/ACT IN AERS: 2.0000 | INHALATION_SPRAY | RESPIRATORY_TRACT | Status: DC | PRN

## 2014-04-11 MED ORDER — FLUTICASONE FUROATE-VILANTEROL 100-25 MCG/INH IN AEPB: 1.0000 | INHALATION_SPRAY | Freq: Every day | RESPIRATORY_TRACT | Status: DC

## 2014-04-11 NOTE — Telephone Encounter (Signed)
Pt needs inhalers and neb med refilled.  [facility]ol [facility] bre[facility], nebulizer med get sent to [facility].  These have been e-scribed and faxed.  Nothing further needed.

## 2014-04-11 NOTE — Telephone Encounter (Signed)
Duplicate message. Please see other message on 04/11/14. Will sign off.

## 2014-04-23 ENCOUNTER — Other Ambulatory Visit: Payer: Self-pay | Admitting: Pulmonary Disease

## 2014-04-25 ENCOUNTER — Ambulatory Visit: Payer: Medicare HMO | Admitting: Adult Health

## 2014-04-26 ENCOUNTER — Ambulatory Visit: Payer: Medicare HMO | Admitting: Adult Health

## 2014-05-03 ENCOUNTER — Other Ambulatory Visit: Payer: Self-pay | Admitting: Hematology and Oncology

## 2014-05-03 DIAGNOSIS — D472 Monoclonal gammopathy: Secondary | ICD-10-CM

## 2014-05-03 DIAGNOSIS — C9000 Multiple myeloma not having achieved remission: Secondary | ICD-10-CM

## 2014-05-04 ENCOUNTER — Ambulatory Visit: Payer: Medicare Other | Admitting: Hematology and Oncology

## 2014-05-04 ENCOUNTER — Telehealth: Payer: Self-pay | Admitting: Pulmonary Disease

## 2014-05-04 ENCOUNTER — Other Ambulatory Visit: Payer: Medicare Other

## 2014-05-04 NOTE — Telephone Encounter (Signed)
ATC pt line busy x 3 wcb 

## 2014-05-07 ENCOUNTER — Telehealth: Payer: Self-pay | Admitting: Pulmonary Disease

## 2014-05-07 NOTE — Telephone Encounter (Signed)
Pt is seeing TP tomorrow. Will forward to [employer]

## 2014-05-07 NOTE — Telephone Encounter (Signed)
Noted Will sign off 

## 2014-05-07 NOTE — Telephone Encounter (Signed)
Spoke with Maudie Mercury at [facility]-states patient has [facility] and now goes to [place] for O2 needs and Neb meds. Spoke with Arbie Cookey at [facility] they sent Rx for neb meds to their pharmacy on 04/16/14 and will look into why the patient has not gotten the medication and update the patient.   Arbie Cookey then stated she needs order for patient's O2 with liter flow,etc and must have qualifying O2 sats. Pt is not due to see anyone until 05-28-14.  Pt needs to come in to be seen.   Spoke with patient-aware of [facility] to call him regarding his neb meds and will see TP tomorrow afternoon at 4:30pm for OV and O2 sats to send to [facility]. Nothing more needed at this time.

## 2014-05-07 NOTE — Telephone Encounter (Signed)
Spoke with Steven Beasley at [facility], states that the pharmacy has been trying to contact pt to ship a medication but cannot ship it without speaking to pt.  Pt has appt tomorrow, will forward to [place] as FYI.

## 2014-05-08 ENCOUNTER — Ambulatory Visit: Payer: Commercial Managed Care - HMO | Admitting: Adult Health

## 2014-05-09 NOTE — Telephone Encounter (Signed)
Pt cancelled the 1.19.16 ov w/ TP and rescheduled for 2.8.16

## 2014-05-14 ENCOUNTER — Ambulatory Visit: Payer: Medicare HMO | Admitting: Family Medicine

## 2014-05-21 ENCOUNTER — Ambulatory Visit: Payer: Medicare HMO | Admitting: Family Medicine

## 2014-05-22 ENCOUNTER — Telehealth: Payer: Self-pay | Admitting: Pulmonary Disease

## 2014-05-22 NOTE — Telephone Encounter (Signed)
ATC NA, phone rang several times with no voicemail.  wcb

## 2014-05-23 NOTE — Telephone Encounter (Signed)
Called and spoke to pt. Pt stated his neb machine isn't working correctly. He stated it does not aerosol the medication anymore. Advised pt to contact his DME company to have them look at it to see if it can be faxed. Advised pt to call us back to update [place]. Pt verbalized understanding and denied any further questions or concerns at this time.

## 2014-05-28 ENCOUNTER — Ambulatory Visit: Payer: Medicare HMO | Admitting: Family Medicine

## 2014-05-28 ENCOUNTER — Ambulatory Visit: Payer: Commercial Managed Care - HMO | Admitting: Adult Health

## 2014-05-31 ENCOUNTER — Telehealth: Payer: Self-pay | Admitting: Pulmonary Disease

## 2014-05-31 ENCOUNTER — Ambulatory Visit: Payer: Commercial Managed Care - HMO | Admitting: Family Medicine

## 2014-05-31 DIAGNOSIS — J449 Chronic obstructive pulmonary disease, unspecified: Secondary | ICD-10-CM

## 2014-05-31 NOTE — Telephone Encounter (Signed)
lmomtcb x1 

## 2014-06-01 NOTE — Telephone Encounter (Signed)
Order has been placed for nebulizer machine. Pt is aware. Nothing further is needed.

## 2014-06-04 ENCOUNTER — Ambulatory Visit: Payer: Commercial Managed Care - HMO | Admitting: Adult Health

## 2014-06-06 ENCOUNTER — Telehealth: Payer: Self-pay | Admitting: Pulmonary Disease

## 2014-06-06 MED ORDER — AMLODIPINE BESYLATE 10 MG PO TABS: 10.0000 mg | ORAL_TABLET | Freq: Every day | ORAL | Status: DC

## 2014-06-06 NOTE — Telephone Encounter (Signed)
Ok to refill x 1  

## 2014-06-06 NOTE — Telephone Encounter (Signed)
Per Dr. Bari Mantis approval, refill sent on Amlodipine.  Patient notified.  Nothing further needed.

## 2014-06-06 NOTE — Telephone Encounter (Signed)
Pt is needing a refill on Amlodipine 10mg . States that he does not have a PCP right now and is out of medication.  RA - please advise if you are okay with filling this medication.

## 2014-06-13 ENCOUNTER — Other Ambulatory Visit: Payer: Self-pay | Admitting: *Deleted

## 2014-06-13 MED ORDER — IPRATROPIUM BROMIDE HFA 17 MCG/ACT IN AERS: 2.0000 | INHALATION_SPRAY | RESPIRATORY_TRACT | Status: DC | PRN

## 2014-06-13 MED ORDER — PANTOPRAZOLE SODIUM 40 MG PO TBEC: 40.0000 mg | DELAYED_RELEASE_TABLET | Freq: Every day | ORAL | Status: DC

## 2014-06-19 ENCOUNTER — Telehealth: Payer: Self-pay | Admitting: Pulmonary Disease

## 2014-06-19 MED ORDER — ALBUTEROL SULFATE HFA 108 (90 BASE) MCG/ACT IN AERS: 2.0000 | INHALATION_SPRAY | Freq: Four times a day (QID) | RESPIRATORY_TRACT | Status: DC | PRN

## 2014-06-19 NOTE — Telephone Encounter (Signed)
Received letter stated pt's proair is non-formulary. Rx sent for proventil. Pt is due for an appt.  ATC pt x 3. Line busy. Pt will need to be called back to schedule f/u with TP or RA.   Will forward to [place] to f/u on.

## 2014-06-21 NOTE — Telephone Encounter (Signed)
Patient says he has been waiting for our office to contact him regarding a nebulizer machine.  I see a TE dated 05/31/14 stating order was entered for Nebulizer, but I do not see order in system. Ria Comment, did you send an order for this?  Please advise.

## 2014-06-22 NOTE — Telephone Encounter (Signed)
Pt called to say he got his meds

## 2014-06-22 NOTE — Telephone Encounter (Signed)
PCC's send these orders.

## 2014-07-02 ENCOUNTER — Encounter: Payer: Self-pay | Admitting: Family Medicine

## 2014-07-02 ENCOUNTER — Ambulatory Visit (INDEPENDENT_AMBULATORY_CARE_PROVIDER_SITE_OTHER): Payer: Commercial Managed Care - HMO | Admitting: Family Medicine

## 2014-07-02 ENCOUNTER — Other Ambulatory Visit: Payer: Self-pay | Admitting: Pulmonary Disease

## 2014-07-02 VITALS — BP 121/87 | HR 93 | Temp 98.1°F | Ht 73.0 in | Wt 168.0 lb

## 2014-07-02 DIAGNOSIS — Z23 Encounter for immunization: Secondary | ICD-10-CM | POA: Diagnosis not present

## 2014-07-02 DIAGNOSIS — R05 Cough: Secondary | ICD-10-CM | POA: Diagnosis not present

## 2014-07-02 DIAGNOSIS — R059 Cough, unspecified: Secondary | ICD-10-CM | POA: Insufficient documentation

## 2014-07-02 DIAGNOSIS — J449 Chronic obstructive pulmonary disease, unspecified: Secondary | ICD-10-CM | POA: Diagnosis not present

## 2014-07-02 DIAGNOSIS — Z7189 Other specified counseling: Secondary | ICD-10-CM

## 2014-07-02 DIAGNOSIS — Z7689 Persons encountering health services in other specified circumstances: Secondary | ICD-10-CM

## 2014-07-02 DIAGNOSIS — R259 Unspecified abnormal involuntary movements: Secondary | ICD-10-CM

## 2014-07-02 MED ORDER — ALBUTEROL SULFATE HFA 108 (90 BASE) MCG/ACT IN AERS: 2.0000 | INHALATION_SPRAY | Freq: Four times a day (QID) | RESPIRATORY_TRACT | Status: DC | PRN

## 2014-07-02 MED ORDER — PNEUMOCOCCAL 13-VAL CONJ VACC IM SUSP
0.5000 mL | INTRAMUSCULAR | Status: DC
Start: 2014-07-03 — End: 2014-07-02

## 2014-07-02 NOTE — [place] Notes (Addendum)
Georges Lynch, MD, MS Phone: [phone]  Subjective:  Chief complaint  Pt Here for establishment of care. Patient is here for establishment of care into our clinic. He states that he tries to stay active each day and walks just about everywhere he needs to go. He denies any recent illnesses or fevers. With that said, patient does report a 6 month history of a cough. Cough is typically worse at night when he is laying down in bed. He states that he occasionally will cough up a white foam-like substance. He also reports some rhinorrhea during that time. Patient has a past medical history significant for COPD and GERD.  Review of Systems  Constitutional: Negative for fever, chills, malaise/fatigue and diaphoresis.  HENT: Negative for congestion and sore throat.   Eyes: Negative for blurred vision and double vision.  Respiratory: Positive for cough and sputum production. Negative for hemoptysis, shortness of breath and wheezing.   Cardiovascular: Negative for chest pain, palpitations, orthopnea and leg swelling.  Gastrointestinal: Negative for heartburn, nausea, vomiting, abdominal pain and diarrhea.  Genitourinary: Negative for dysuria.  Musculoskeletal: Negative for myalgias.  Neurological: Positive for tremors. Negative for dizziness, tingling, weakness and headaches.  Psychiatric/Behavioral: Negative for depression and substance abuse.   Past Medical History Patient Active Problem List   Diagnosis Date Noted  . Establishing care with new doctor, encounter for 07/02/2014  . Cough 07/02/2014  . COPD (chronic obstructive pulmonary disease) 12/13/2012  . Pulmonary nodules/lesions, multiple 10/26/2012  . 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 Leukocytopenia   . Abdominal pain, chronic, generalized   . GERD (gastroesophageal reflux disease)   . BPH (benign prostatic hyperplasia)   . UNDERWEIGHT 10/01/2009    Medications- reviewed and updated Current Outpatient Prescriptions  Medication Sig  Dispense Refill  . albuterol (PROVENTIL HFA;VENTOLIN HFA) 108 (90 BASE) MCG/ACT inhaler Inhale 2 puffs into the lungs every 6 (six) hours as needed for wheezing or shortness of breath. 1 Inhaler 6  . albuterol (PROVENTIL) (2.5 MG/3ML) 0.083% nebulizer solution Take 3 mLs (2.5 mg total) by nebulization every 4 (four) hours as needed for wheezing or shortness of breath (dx J44.9). 360 vial 6  . amLODipine (NORVASC) 10 MG tablet Take 1 tablet (10 mg total) by mouth daily. 30 tablet 1  . diazepam (VALIUM) 5 MG tablet Take 1 tablet (5 mg total) by mouth 2 (two) times daily. 10 tablet 0  . esomeprazole (NEXIUM) 40 MG capsule Take 40 mg by mouth daily.    . fluticasone (FLONASE) 50 MCG/ACT nasal spray Place 2 sprays into the nose daily.    . Fluticasone Furoate-Vilanterol (BREO ELLIPTA) 100-25 MCG/INH AEPB Inhale 1 puff into the lungs daily. 1 each 6  . gabapentin (NEURONTIN) 300 MG capsule Take 300 mg by mouth 3 (three) times daily.      Marland Kitchen ibuprofen (ADVIL,MOTRIN) 800 MG tablet Take 1 tablet (800 mg total) by mouth 3 (three) times daily. 21 tablet 0  . ipratropium (ATROVENT HFA) 17 MCG/ACT inhaler Inhale 2 puffs into the lungs every 4 (four) hours as needed for wheezing. 1 Inhaler 3  . pantoprazole (PROTONIX) 40 MG tablet Take 1 tablet (40 mg total) by mouth daily. 30 tablet 3  . pentosan polysulfate (ELMIRON) 100 MG capsule Take 100 mg by mouth 3 (three) times daily before meals.     . Tamsulosin HCl (FLOMAX) 0.4 MG CAPS Take 0.4 mg by mouth daily.     Marland Kitchen venlafaxine (EFFEXOR-XR) 150 MG 24 hr capsule Take 150 mg by mouth  daily.       No current facility-administered medications for this visit.    Objective: BP 121/87 mmHg  Pulse 93  Temp(Src) 98.1 F (36.7 C) (Oral)  Ht 6\' 1"  (1.854 m)  Wt 168 lb (76.204 kg)  BMI 22.17 kg/m2 Gen: NAD, alert, cooperative with exam HEENT: NCAT, EOMI, PERRL, dentition poor; oropharynx without erythema or cobblestoning; nasal mucosa normal. CV: RRR, no  murmur Resp: CTABL, slightly distant, no wheezes, non-labored Abd: Soft, Non Tender, Non Distended, BS present, no guarding or organomegaly Ext: No edema, warm Neuro: Alert and oriented, resting tremor appreciated with greatest involvement in right hand. Strength 5/5 bilaterally. Finger to nose normal, heel to shin normal. Reflexes normal throughout. No sensation deficits.   Assessment/Plan:  Establishing care with new doctor, encounter for Patient is here for cell care with a new physician. Complains of a cough 6 months. Patient asked for medication refills for his Atrovent, PPI, and albuterol.  - No significant change in therapies at this time. - Obtained labs for baseline blood work - Patient needed new referral to pulmonology. He has a significant history for COPD and pulmonary nodules. Patient may need further workup for this chronic cough due to his history of smoking and pulmonary lesions, which may include PFTs and chest CT.   Cough Cough is been ongoing for 6 months. It is worse at night. Occasionally productive with white foam-like sputum. Denies hemoptysis. Reports some rhinorrhea. Physical exam negative for oropharynx erythema or cobblestoning. Breath sounds slightly distant without any wheezes or crackles. Patient denied any significant food consumption prior to laying down for bed. He does not prop himself up when sleeping. He reports minimal alcohol use, and only on the weekends. Provider did not ask about caffeine consumption. - Patient is currently on a PPI for GERD. Denies any heartburn or sourness experienced in the back of his throat in the mornings. - Patient has a history of smoking as well as pulmonary nodules recognized on CT on 04/18/13. - Referral to pulmonology was requested by patient. It appears as though patient has already seen pulmonology in the past. Referral made. - I've asked patient to follow-up with me in 1-2 months, I will reassess at that  time.   Patient on longterm medications for COPD; resting tremor noted on exam - obtain labs. CBC, TSH, Lipid panel  Orders Placed This Encounter  Procedures  . Flu Vaccine QUAD 36+ mos IM  . Pneumococcal conjugate vaccine 13-valent  . BASIC METABOLIC PANEL WITH GFR  . CBC  . TSH  . Lipid Panel  . Ambulatory referral to Pulmonology    Referral Priority:  Routine    Referral Type:  Consultation    Referral Reason:  Specialty Services Required    Requested Specialty:  Pulmonary Disease    Number of Visits Requested:  1  . POCT A1C    Meds ordered this encounter  Medications  . albuterol (PROVENTIL HFA;VENTOLIN HFA) 108 (90 BASE) MCG/ACT inhaler    Sig: Inhale 2 puffs into the lungs every 6 (six) hours as needed for wheezing or shortness of breath.    Dispense:  1 Inhaler    Refill:  6  . DISCONTD: pneumococcal 13-valent conjugate vaccine (PREVNAR 13) injection 0.5 mL    Sig:   . esomeprazole (NEXIUM) 40 MG capsule    Sig: Take 40 mg by mouth daily.

## 2014-07-02 NOTE — Assessment & Plan Note (Signed)
Cough is been ongoing for 6 months. It is worse at night. Occasionally productive with white foam-like sputum. Denies hemoptysis. Reports some rhinorrhea. Physical exam negative for oropharynx erythema or cobblestoning. Breath sounds slightly distant without any wheezes or crackles. Patient denied any significant food consumption prior to laying down for bed. He does not prop himself up when sleeping. He reports minimal alcohol use, and only on the weekends. Provider did not ask about caffeine consumption. - Patient is currently on a PPI for GERD. Denies any heartburn or sourness experienced in the back of his throat in the mornings. - Patient has a history of smoking as well as pulmonary nodules recognized on CT on 04/18/13. - Referral to pulmonology was requested by patient. It appears as though patient has already seen pulmonology in the past. Referral made. - I've asked patient to follow-up with me in 1-2 months, I will reassess at that time.

## 2014-07-02 NOTE — Assessment & Plan Note (Signed)
Patient is here for cell care with a new physician. Complains of a cough 6 months. Patient asked for medication refills for his Atrovent, PPI, and albuterol.  - No significant change in therapies at this time. - Obtained labs for baseline blood work - Patient needed new referral to pulmonology. He has a significant history for COPD and pulmonary nodules. Patient may need further workup for this chronic cough due to his history of smoking and pulmonary lesions, which may include PFTs and chest CT.

## 2014-07-02 NOTE — Patient Instructions (Signed)
It was a pleasure seeing you today in our clinic. Today we discussed your cough. Here is the treatment plan we have discussed and agreed upon together:  - I have placed a referral for you to see a pulmonologist [place]. - I've refilled the 2 medications your running low on. - I have ordered the basic lab tests we obtained for every adult coming into our practice. - We provided you with a influenza and a pneumonia 13 vaccine.

## 2014-07-03 ENCOUNTER — Other Ambulatory Visit: Payer: Self-pay | Admitting: *Deleted

## 2014-07-03 LAB — BASIC METABOLIC PANEL WITH GFR
BUN: 14 mg/dL (ref 6–23)
CO2: 32 mEq/L (ref 19–32)
CREATININE: 1.05 mg/dL (ref 0.50–1.35)
Calcium: 9.3 mg/dL (ref 8.4–10.5)
Chloride: 100 mEq/L (ref 96–112)
GFR, Est African American: 81 mL/min
GFR, Est Non African American: 70 mL/min
GLUCOSE: 84 mg/dL (ref 70–99)
POTASSIUM: 4.2 meq/L (ref 3.5–5.3)
Sodium: 137 mEq/L (ref 135–145)

## 2014-07-03 LAB — LIPID PANEL
CHOLESTEROL: 118 mg/dL (ref 0–200)
HDL: 61 mg/dL (ref >=40)
LDL CALC: 45 mg/dL (ref 0–99)
TRIGLYCERIDES: 58 mg/dL (ref <150)
Total CHOL/HDL Ratio: 1.9 Ratio
VLDL: 12 mg/dL (ref 0–40)

## 2014-07-03 LAB — CBC
HCT: 35.6 % — ABNORMAL LOW (ref 39.0–52.0)
Hemoglobin: 11.9 g/dL — ABNORMAL LOW (ref 13.0–17.0)
MCH: 32.5 pg (ref 26.0–34.0)
MCHC: 33.4 g/dL (ref 30.0–36.0)
MCV: 97.3 fL (ref 78.0–100.0)
MPV: 9.8 fL (ref 8.6–12.4)
Platelets: 227 10*3/uL (ref 150–400)
RBC: 3.66 MIL/uL — ABNORMAL LOW (ref 4.22–5.81)
RDW: 13.6 % (ref 11.5–15.5)
WBC: 3.6 10*3/uL — ABNORMAL LOW (ref 4.0–10.5)

## 2014-07-03 LAB — TSH: TSH: 1.954 u[IU]/mL (ref 0.350–4.500)

## 2014-07-04 ENCOUNTER — Other Ambulatory Visit: Payer: Self-pay | Admitting: *Deleted

## 2014-07-04 DIAGNOSIS — K219 Gastro-esophageal reflux disease without esophagitis: Secondary | ICD-10-CM

## 2014-07-04 DIAGNOSIS — D649 Anemia, unspecified: Secondary | ICD-10-CM

## 2014-07-04 DIAGNOSIS — D72819 Decreased white blood cell count, unspecified: Secondary | ICD-10-CM

## 2014-07-04 DIAGNOSIS — Z23 Encounter for immunization: Secondary | ICD-10-CM

## 2014-07-04 DIAGNOSIS — N400 Enlarged prostate without lower urinary tract symptoms: Secondary | ICD-10-CM

## 2014-07-04 DIAGNOSIS — C9000 Multiple myeloma not having achieved remission: Secondary | ICD-10-CM

## 2014-07-04 DIAGNOSIS — N08 Glomerular disorders in diseases classified elsewhere: Secondary | ICD-10-CM

## 2014-07-04 MED ORDER — VENLAFAXINE HCL ER 150 MG PO CP24: 150.0000 mg | ORAL_CAPSULE | Freq: Every day | ORAL | Status: DC

## 2014-07-07 ENCOUNTER — Encounter (HOSPITAL_COMMUNITY): Payer: Self-pay | Admitting: Emergency Medicine

## 2014-07-07 ENCOUNTER — Emergency Department (HOSPITAL_COMMUNITY): Payer: Commercial Managed Care - HMO

## 2014-07-07 ENCOUNTER — Inpatient Hospital Stay (HOSPITAL_COMMUNITY)
Admission: EM | Admit: 2014-07-07 | Discharge: 2014-07-16 | DRG: 190 | Disposition: A | Discharge status: Home / Self Care | Payer: Commercial Managed Care - HMO | Attending: Family Medicine | Admitting: Family Medicine

## 2014-07-07 DIAGNOSIS — R0600 Dyspnea, unspecified: Secondary | ICD-10-CM | POA: Diagnosis not present

## 2014-07-07 DIAGNOSIS — I5022 Chronic systolic (congestive) heart failure: Secondary | ICD-10-CM | POA: Insufficient documentation

## 2014-07-07 DIAGNOSIS — J9602 Acute respiratory failure with hypercapnia: Secondary | ICD-10-CM | POA: Diagnosis not present

## 2014-07-07 DIAGNOSIS — K219 Gastro-esophageal reflux disease without esophagitis: Secondary | ICD-10-CM | POA: Diagnosis present

## 2014-07-07 DIAGNOSIS — C9000 Multiple myeloma not having achieved remission: Secondary | ICD-10-CM | POA: Diagnosis present

## 2014-07-07 DIAGNOSIS — I501 Left ventricular failure: Secondary | ICD-10-CM | POA: Diagnosis present

## 2014-07-07 DIAGNOSIS — I420 Dilated cardiomyopathy: Secondary | ICD-10-CM | POA: Diagnosis not present

## 2014-07-07 DIAGNOSIS — R918 Other nonspecific abnormal finding of lung field: Secondary | ICD-10-CM

## 2014-07-07 DIAGNOSIS — R0902 Hypoxemia: Secondary | ICD-10-CM | POA: Diagnosis present

## 2014-07-07 DIAGNOSIS — Z79899 Other long term (current) drug therapy: Secondary | ICD-10-CM | POA: Diagnosis not present

## 2014-07-07 DIAGNOSIS — E872 Acidosis: Secondary | ICD-10-CM | POA: Diagnosis present

## 2014-07-07 DIAGNOSIS — I429 Cardiomyopathy, unspecified: Secondary | ICD-10-CM | POA: Diagnosis present

## 2014-07-07 DIAGNOSIS — J441 Chronic obstructive pulmonary disease with (acute) exacerbation: Secondary | ICD-10-CM | POA: Diagnosis present

## 2014-07-07 DIAGNOSIS — I509 Heart failure, unspecified: Secondary | ICD-10-CM | POA: Diagnosis not present

## 2014-07-07 DIAGNOSIS — I10 Essential (primary) hypertension: Secondary | ICD-10-CM | POA: Diagnosis present

## 2014-07-07 DIAGNOSIS — E871 Hypo-osmolality and hyponatremia: Secondary | ICD-10-CM | POA: Diagnosis present

## 2014-07-07 DIAGNOSIS — J189 Pneumonia, unspecified organism: Secondary | ICD-10-CM | POA: Diagnosis present

## 2014-07-07 DIAGNOSIS — J479 Bronchiectasis, uncomplicated: Secondary | ICD-10-CM | POA: Diagnosis present

## 2014-07-07 DIAGNOSIS — Z87891 Personal history of nicotine dependence: Secondary | ICD-10-CM | POA: Diagnosis not present

## 2014-07-07 DIAGNOSIS — I4510 Unspecified right bundle-branch block: Secondary | ICD-10-CM | POA: Diagnosis present

## 2014-07-07 DIAGNOSIS — J069 Acute upper respiratory infection, unspecified: Secondary | ICD-10-CM | POA: Diagnosis present

## 2014-07-07 DIAGNOSIS — J45909 Unspecified asthma, uncomplicated: Secondary | ICD-10-CM | POA: Diagnosis present

## 2014-07-07 DIAGNOSIS — N400 Enlarged prostate without lower urinary tract symptoms: Secondary | ICD-10-CM | POA: Diagnosis present

## 2014-07-07 DIAGNOSIS — Z791 Long term (current) use of non-steroidal anti-inflammatories (NSAID): Secondary | ICD-10-CM

## 2014-07-07 DIAGNOSIS — R0602 Shortness of breath: Secondary | ICD-10-CM

## 2014-07-07 DIAGNOSIS — Z825 Family history of asthma and other chronic lower respiratory diseases: Secondary | ICD-10-CM | POA: Diagnosis not present

## 2014-07-07 DIAGNOSIS — I5023 Acute on chronic systolic (congestive) heart failure: Secondary | ICD-10-CM | POA: Diagnosis present

## 2014-07-07 DIAGNOSIS — D472 Monoclonal gammopathy: Secondary | ICD-10-CM

## 2014-07-07 DIAGNOSIS — R630 Anorexia: Secondary | ICD-10-CM | POA: Diagnosis present

## 2014-07-07 DIAGNOSIS — I5021 Acute systolic (congestive) heart failure: Secondary | ICD-10-CM | POA: Diagnosis not present

## 2014-07-07 LAB — I-STAT ARTERIAL BLOOD GAS, ED
Acid-Base Excess: 3 mmol/L — ABNORMAL HIGH (ref 0.0–2.0)
Bicarbonate: 29.3 mEq/L — ABNORMAL HIGH (ref 20.0–24.0)
O2 Saturation: 63 %
PH ART: 7.377 (ref 7.350–7.450)
Patient temperature: 98.6
TCO2: 31 mmol/L (ref 0–100)
pCO2 arterial: 49.9 mmHg — ABNORMAL HIGH (ref 35.0–45.0)
pO2, Arterial: 34 mmHg — CL (ref 80.0–100.0)

## 2014-07-07 LAB — INFLUENZA PANEL BY PCR (TYPE A & B)
H1N1FLUPCR: NOT DETECTED
Influenza A By PCR: NEGATIVE
Influenza B By PCR: NEGATIVE

## 2014-07-07 LAB — CBC WITH DIFFERENTIAL/PLATELET
Basophils Absolute: 0 10*3/uL (ref 0.0–0.1)
Basophils Relative: 0 % (ref 0–1)
EOS PCT: 1 % (ref 0–5)
Eosinophils Absolute: 0.1 10*3/uL (ref 0.0–0.7)
HCT: 33.4 % — ABNORMAL LOW (ref 39.0–52.0)
Hemoglobin: 11.3 g/dL — ABNORMAL LOW (ref 13.0–17.0)
Lymphocytes Relative: 30 % (ref 12–46)
Lymphs Abs: 2.1 10*3/uL (ref 0.7–4.0)
MCH: 32.6 pg (ref 26.0–34.0)
MCHC: 33.8 g/dL (ref 30.0–36.0)
MCV: 96.3 fL (ref 78.0–100.0)
MONO ABS: 1 10*3/uL (ref 0.1–1.0)
MONOS PCT: 14 % — AB (ref 3–12)
Neutro Abs: 3.9 10*3/uL (ref 1.7–7.7)
Neutrophils Relative %: 55 % (ref 43–77)
Platelets: 278 10*3/uL (ref 150–400)
RBC: 3.47 MIL/uL — ABNORMAL LOW (ref 4.22–5.81)
RDW: 13.2 % (ref 11.5–15.5)
WBC: 7.1 10*3/uL (ref 4.0–10.5)

## 2014-07-07 LAB — COMPREHENSIVE METABOLIC PANEL
ALBUMIN: 3.7 g/dL (ref 3.5–5.2)
ALT: 72 U/L — ABNORMAL HIGH (ref 0–53)
AST: 46 U/L — ABNORMAL HIGH (ref 0–37)
Alkaline Phosphatase: 52 U/L (ref 39–117)
Anion gap: 10 (ref 5–15)
BUN: 6 mg/dL (ref 6–23)
CO2: 27 mmol/L (ref 19–32)
Calcium: 9.3 mg/dL (ref 8.4–10.5)
Chloride: 96 mmol/L (ref 96–112)
Creatinine, Ser: 0.88 mg/dL (ref 0.50–1.35)
GFR calc Af Amer: >90 mL/min (ref >90)
GFR calc non Af Amer: 83 mL/min — ABNORMAL LOW (ref >90)
GLUCOSE: 107 mg/dL — AB (ref 70–99)
Potassium: 3.8 mmol/L (ref 3.5–5.1)
Sodium: 133 mmol/L — ABNORMAL LOW (ref 135–145)
TOTAL PROTEIN: 6.8 g/dL (ref 6.0–8.3)
Total Bilirubin: 0.7 mg/dL (ref 0.3–1.2)

## 2014-07-07 LAB — CBC
HEMATOCRIT: 32.4 % — AB (ref 39.0–52.0)
HEMOGLOBIN: 11 g/dL — AB (ref 13.0–17.0)
MCH: 32.7 pg (ref 26.0–34.0)
MCHC: 34 g/dL (ref 30.0–36.0)
MCV: 96.4 fL (ref 78.0–100.0)
Platelets: 279 10*3/uL (ref 150–400)
RBC: 3.36 MIL/uL — ABNORMAL LOW (ref 4.22–5.81)
RDW: 13.2 % (ref 11.5–15.5)
WBC: 5.3 10*3/uL (ref 4.0–10.5)

## 2014-07-07 LAB — CREATININE, SERUM
Creatinine, Ser: 0.87 mg/dL (ref 0.50–1.35)
GFR calc Af Amer: >90 mL/min (ref >90)
GFR calc non Af Amer: 84 mL/min — ABNORMAL LOW (ref >90)

## 2014-07-07 LAB — GLUCOSE, CAPILLARY: GLUCOSE-CAPILLARY: 184 mg/dL — AB (ref 70–99)

## 2014-07-07 MED ORDER — IPRATROPIUM-ALBUTEROL 0.5-2.5 (3) MG/3ML IN SOLN
3.0000 mL | Freq: Four times a day (QID) | RESPIRATORY_TRACT | Status: DC
  Administered 2014-07-07 – 2014-07-10 (×13): 3 mL via RESPIRATORY_TRACT
  Filled 2014-07-07 (×12): qty 3

## 2014-07-07 MED ORDER — DEXTROSE 5 % IV SOLN
500.0000 mg | Freq: Once | INTRAVENOUS | Status: AC
  Administered 2014-07-07: 500 mg via INTRAVENOUS
  Filled 2014-07-07: qty 500

## 2014-07-07 MED ORDER — SODIUM CHLORIDE 0.9 % IV BOLUS (SEPSIS)
500.0000 mL | Freq: Once | INTRAVENOUS | Status: AC
  Administered 2014-07-07: 500 mL via INTRAVENOUS

## 2014-07-07 MED ORDER — SODIUM CHLORIDE 0.9 % IV SOLN
INTRAVENOUS | Status: DC
  Administered 2014-07-08 – 2014-07-09 (×2): via INTRAVENOUS

## 2014-07-07 MED ORDER — SODIUM CHLORIDE 0.9 % IV SOLN: INTRAVENOUS | Status: DC

## 2014-07-07 MED ORDER — BUDESONIDE-FORMOTEROL FUMARATE 160-4.5 MCG/ACT IN AERO
2.0000 | INHALATION_SPRAY | Freq: Two times a day (BID) | RESPIRATORY_TRACT | Status: DC
  Administered 2014-07-07 – 2014-07-09 (×5): 2 via RESPIRATORY_TRACT
  Filled 2014-07-07: qty 6

## 2014-07-07 MED ORDER — FLUTICASONE FUROATE-VILANTEROL 100-25 MCG/INH IN AEPB: 1.0000 | INHALATION_SPRAY | Freq: Every day | RESPIRATORY_TRACT | Status: DC

## 2014-07-07 MED ORDER — PREDNISONE 20 MG PO TABS
40.0000 mg | ORAL_TABLET | Freq: Every day | ORAL | Status: DC
  Administered 2014-07-08 – 2014-07-09 (×2): 40 mg via ORAL
  Filled 2014-07-07 (×5): qty 2

## 2014-07-07 MED ORDER — SODIUM CHLORIDE 0.9 % IV SOLN
INTRAVENOUS | Status: DC
  Administered 2014-07-07: 75 mL/h via INTRAVENOUS

## 2014-07-07 MED ORDER — PROMETHAZINE HCL 25 MG/ML IJ SOLN
12.5000 mg | Freq: Four times a day (QID) | INTRAMUSCULAR | Status: DC | PRN
  Administered 2014-07-07 – 2014-07-09 (×2): 12.5 mg via INTRAVENOUS
  Filled 2014-07-07 (×2): qty 1

## 2014-07-07 MED ORDER — IBUPROFEN 200 MG PO TABS: 800.0000 mg | ORAL_TABLET | Freq: Three times a day (TID) | ORAL | Status: DC | PRN

## 2014-07-07 MED ORDER — PENTOSAN POLYSULFATE SODIUM 100 MG PO CAPS
100.0000 mg | ORAL_CAPSULE | Freq: Three times a day (TID) | ORAL | Status: DC
  Administered 2014-07-07 – 2014-07-16 (×29): 100 mg via ORAL
  Filled 2014-07-07 (×36): qty 1

## 2014-07-07 MED ORDER — PANTOPRAZOLE SODIUM 40 MG PO TBEC
80.0000 mg | DELAYED_RELEASE_TABLET | Freq: Every day | ORAL | Status: DC
  Administered 2014-07-07 – 2014-07-16 (×10): 80 mg via ORAL
  Filled 2014-07-07 (×6): qty 2

## 2014-07-07 MED ORDER — HEPARIN SODIUM (PORCINE) 5000 UNIT/ML IJ SOLN
5000.0000 [IU] | Freq: Three times a day (TID) | INTRAMUSCULAR | Status: DC
  Administered 2014-07-07 – 2014-07-12 (×15): 5000 [IU] via SUBCUTANEOUS
  Filled 2014-07-07 (×20): qty 1

## 2014-07-07 MED ORDER — IPRATROPIUM-ALBUTEROL 0.5-2.5 (3) MG/3ML IN SOLN
3.0000 mL | Freq: Once | RESPIRATORY_TRACT | Status: AC
  Administered 2014-07-07: 3 mL via RESPIRATORY_TRACT
  Filled 2014-07-07: qty 3

## 2014-07-07 MED ORDER — LEVOFLOXACIN IN D5W 500 MG/100ML IV SOLN
500.0000 mg | Freq: Every day | INTRAVENOUS | Status: DC
  Administered 2014-07-07: 500 mg via INTRAVENOUS
  Filled 2014-07-07 (×2): qty 100

## 2014-07-07 MED ORDER — VENLAFAXINE HCL ER 150 MG PO CP24
150.0000 mg | ORAL_CAPSULE | Freq: Every day | ORAL | Status: DC
  Administered 2014-07-07 – 2014-07-16 (×10): 150 mg via ORAL
  Filled 2014-07-07 (×10): qty 1

## 2014-07-07 MED ORDER — AMLODIPINE BESYLATE 10 MG PO TABS
10.0000 mg | ORAL_TABLET | Freq: Every day | ORAL | Status: DC
  Administered 2014-07-07 – 2014-07-10 (×4): 10 mg via ORAL
  Filled 2014-07-07 (×4): qty 1

## 2014-07-07 MED ORDER — ALBUTEROL SULFATE (2.5 MG/3ML) 0.083% IN NEBU: 2.5000 mg | INHALATION_SOLUTION | RESPIRATORY_TRACT | Status: DC | PRN

## 2014-07-07 MED ORDER — GABAPENTIN 300 MG PO CAPS
300.0000 mg | ORAL_CAPSULE | Freq: Three times a day (TID) | ORAL | Status: DC
  Administered 2014-07-07 – 2014-07-16 (×28): 300 mg via ORAL
  Filled 2014-07-07 (×33): qty 1

## 2014-07-07 MED ORDER — TAMSULOSIN HCL 0.4 MG PO CAPS
0.4000 mg | ORAL_CAPSULE | Freq: Every day | ORAL | Status: DC
Start: 2014-07-07 — End: 2014-07-16
  Administered 2014-07-07 – 2014-07-16 (×10): 0.4 mg via ORAL
  Filled 2014-07-07 (×10): qty 1

## 2014-07-07 MED ORDER — IPRATROPIUM-ALBUTEROL 0.5-2.5 (3) MG/3ML IN SOLN
3.0000 mL | RESPIRATORY_TRACT | Status: DC
  Administered 2014-07-07: 3 mL via RESPIRATORY_TRACT
  Filled 2014-07-07: qty 3

## 2014-07-07 MED ORDER — DEXTROSE 5 % IV SOLN
1.0000 g | Freq: Once | INTRAVENOUS | Status: AC
  Administered 2014-07-07: 1 g via INTRAVENOUS
  Filled 2014-07-07: qty 10

## 2014-07-07 MED ORDER — METHYLPREDNISOLONE SODIUM SUCC 125 MG IJ SOLR
125.0000 mg | Freq: Once | INTRAMUSCULAR | Status: DC
  Filled 2014-07-07 (×2): qty 2

## 2014-07-07 NOTE — Evaluation (Addendum)
Physical Therapy Evaluation Patient Details Name: Steven Beasley MRN: [id number] DOB: [birth year] Today's Date: 07/07/2014   History of Present Illness  pt presents with COPD exacerbation and CAP.    Clinical Impression  Pt moving well and without deficit.  Pt SOB during ambulation with O2 sats decreasing to 84% on RA and recover to low 90's with seated rest break.  No PT needs at this time, will sign off.      Follow Up Recommendations No PT follow up    Equipment Recommendations  None recommended by PT    Recommendations for Other Services       Precautions / Restrictions Precautions Precautions: None Restrictions Weight Bearing Restrictions: No      Mobility  Bed Mobility Overal bed mobility: Independent                Transfers Overall transfer level: Independent Equipment used: None                Ambulation/Gait Ambulation/Gait assistance: Modified independent (Device/Increase time) Ambulation Distance (Feet): 200 Feet Assistive device: None Gait Pattern/deviations: WFL(Within Functional Limits)     General Gait Details: pt moving very well.  pt has to slow pace 2/2 feeling SOB during ambulation.  pt's O2 sats on RA decrease to 84% during ambulation, but recover to low 90's with seated rest.    Stairs            Wheelchair Mobility    Modified Rankin (Stroke Patients Only)       Balance Overall balance assessment: No apparent balance deficits (not formally assessed)                                           Pertinent Vitals/Pain Pain Assessment: No/denies pain    Home Living Family/patient expects to be discharged to:: Private residence                      Prior Function Level of Independence: Independent               Hand Dominance        Extremity/Trunk Assessment   Upper Extremity Assessment: Overall WFL for tasks assessed           Lower Extremity Assessment: Overall WFL  for tasks assessed      Cervical / Trunk Assessment: Normal  Communication   Communication: No difficulties  Cognition Arousal/Alertness: Awake/alert Behavior During Therapy: WFL for tasks assessed/performed Overall Cognitive Status: Within Functional Limits for tasks assessed                      General Comments      Exercises        Assessment/Plan    PT Assessment Patent does not need any further PT services  PT Diagnosis Difficulty walking   PT Problem List    PT Treatment Interventions     PT Goals (Current goals can be found in the Care Plan section) Acute Rehab PT Goals PT Goal Formulation: All assessment and education complete, DC therapy    Frequency     Barriers to discharge        Co-evaluation               End of Session Equipment Utilized During Treatment: Oxygen Activity Tolerance: Patient tolerated treatment well Patient left: in bed;with  call bell/phone within reach Nurse Communication: Mobility status    Functional Assessment Tool Used: Clinical Judgement Functional Limitation: Mobility: Walking and moving around Mobility: Walking and Moving Around Current Status [phone]): 0 percent impaired, limited or restricted Mobility: Walking and Moving Around Goal Status [phone]): 0 percent impaired, limited or restricted Mobility: Walking and Moving Around Discharge Status [phone]): 0 percent impaired, limited or restricted    Time: 5188-4166 PT Time Calculation (min) (ACUTE ONLY): 24 min   Charges:   PT Evaluation $Initial PT Evaluation Tier I: 1 Procedure PT Treatments $Gait Training: 8-22 mins   PT G Codes:   PT G-Codes **NOT FOR INPATIENT CLASS** Functional Assessment Tool Used: Clinical Judgement Functional Limitation: Mobility: Walking and moving around Mobility: Walking and Moving Around Current Status (A6301): 0 percent impaired, limited or restricted Mobility: Walking and Moving Around Goal Status (S0109): 0 percent  impaired, limited or restricted Mobility: Walking and Moving Around Discharge Status (N2355): 0 percent impaired, limited or restricted    Catarina Hartshorn, [place] [phone] 07/07/2014, 3:09 PM

## 2014-07-07 NOTE — H&P (Signed)
[facility] Admission History and Physical Service Pager: [phone]  Patient name: Steven Beasley Medical record number: [id number] Date of birth: 1941-01-22 Age: 74 y.o. Gender: male  Primary Care Provider: Alease Frame [name], MD Consultants: None Code Status: Full  Chief Complaint: Shortness of brerath  Assessment and Plan: DMARCO BALDUS is a 74 y.o. male presenting with shortness of breath. PMH is significant for HTN, COPD, BPH, GERD, smoldering myeloma.  COPD exacerbation: Patient with dyspnea requiring pxygen, diffuse expiratory wheezes, and productive cough indicating COPD exacerbation. [facility] pulmonology as an outpatient. Patient supposed to be on controller mediation called Breo but patient states he only has one inhaler he takes for rescue. With cough, hypoxia, and tachycardia one could consider PE as a cause, though patient with low risk well score of 1.5 (tachycardia could be explained by albuterol use) and apparent COPD exacerbation/PNA, PE would seem less likely. -Admit to telemetry under Dr. Lindell Noe  -Scheduled Duonebs [phone]; will space as appropriate -albuterol nebs q2 prn -steroid burst started -will give Levaquin to help with both CAP and COPD exacerbation -oxygen as needed to keep saturations >92% -patient needs education on medications and proper use - consider restarting home controller vs other controller medication -if does not improve or decompensates consider d-dimer vs CTA -PT/OT eval and treat  CAP: CXR showing RUQ PNA. Patient however afebrile and no leukocytosis. No recent hospitalizations to suggest HCAP. -s/p CTX and Azithro in ED -will continue with Levaquin -trend fever curve and monitor vitals -ibuprofen prn for pain -recheck labs tomorrow  HTN: Normotensive. 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 Appears stable per their last office note with planned yearly  follow-up. -monitor CBC  PACs/RBBB: appear stable over the past several years. Tachycardia likely induced by albuterol. No CP or dyspnea. -monitor on tele -consider further evaluation if status changes  FEN/GI: Diet Heart; SLIV Prophylaxis: Heparin SQ  Disposition: Admit to FPTS  History of Present Illness: Steven Beasley is a 74 y.o. male presenting with shortness of breath. PMH is significant for HTN, COPD, BPH, GERD, and smoldering myeloma. Patient states that for the past couple of days he has been having increasing SOB. He has been using his albuterol inhaler 2-3x a day with no relief. Denies any use of a control medication. He also endorses rhinorrhea,  productive cough (white sputum), wheezing, and congestion. Denies fevers, chills, CP, orthopnea, and n/v. He does not use oxygen at home. He is unsure whether he has asthma or COPD but does see a pulmonologist for his lung condition.   Also of note patient states that he has not had much of an appetite. He only ate one meal yesterday and now states he is feeling weak. This loss of appetite is acute in onset. Patient states he just has no appetite. Appetite normal 2-3 days ago. Denies weight loss.  Per nursing note, EMS gave 10mg  Albuterol, 1mg  Atrovent, and 125mg  Solu-Medrol en route to ED. Patient was placed on 2L of O2 with saturation at 95%. Per nursing note, patient sats ~85% without O2. CXR ordered showed RUQ PNA so patient was given dose of Rocephin and Azithromycin to cover for CAP.  Review Of Systems: Per HPI. Otherwise 12 point review of systems was performed and was unremarkable.  Patient Active Problem List   Diagnosis Date Noted  . Establishing care with new doctor, encounter for 07/02/2014  . Cough 07/02/2014  . COPD (chronic obstructive pulmonary  disease) 12/13/2012  . Pulmonary nodules/lesions, multiple 10/26/2012  . 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 Leukocytopenia   . Abdominal pain, chronic, generalized   . GERD  (gastroesophageal reflux disease)   . BPH (benign prostatic hyperplasia)   . UNDERWEIGHT 10/01/2009   Past Medical History: Past Medical History  Diagnosis Date  . Hypertension   . Leukocytopenia   . Asthma   . Abdominal pain, chronic, generalized   . GERD (gastroesophageal reflux disease)   . BPH (benign prostatic hyperplasia)   . 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 Shortness of breath    Past Surgical History: Past Surgical History  Procedure Laterality Date  . Video bronchoscopy Bilateral 10/28/2012    Procedure: VIDEO BRONCHOSCOPY WITHOUT FLUORO;  Surgeon: Rigoberto Noel, MD;  Location: [facility];  Service: Cardiopulmonary;  Laterality: Bilateral;   Social History: History  Substance Use Topics  . Smoking status: Former Smoker -- 0.00 packs/day for 0 years    Quit date: 11/09/2010  . Smokeless tobacco: Never Used  . Alcohol Use: 3.6 oz/week    6 Cans of beer per week     Comment: ocasional on weekends   Additional social history: Lives alone Please also refer to relevant sections of EMR.  Family History: History reviewed. No pertinent family history. Allergies and Medications: No Known Allergies No current facility-administered medications on file prior to encounter.   Current Outpatient Prescriptions on File Prior to Encounter  Medication Sig Dispense Refill  . albuterol (PROVENTIL HFA;VENTOLIN HFA) 108 (90 BASE) MCG/ACT inhaler Inhale 2 puffs into the lungs every 6 (six) hours as needed for wheezing or shortness of breath. 1 Inhaler 6  . albuterol (PROVENTIL) (2.5 MG/3ML) 0.083% nebulizer solution Take 3 mLs (2.5 mg total) by nebulization every 4 (four) hours as needed for wheezing or shortness of breath (dx J44.9). 360 vial 6  . amLODipine (NORVASC) 10 MG tablet Take 1 tablet (10 mg total) by mouth daily. 30 tablet 1  . diazepam (VALIUM) 5 MG tablet Take 1 tablet (5 mg total) by mouth 2 (two) times daily. 10 tablet 0  . esomeprazole (NEXIUM) 40 MG capsule Take 40 mg  by mouth daily.    Marland Kitchen esomeprazole (NEXIUM) 40 MG capsule TAKE ONE CAPSULE BY MOUTH EVERY MORNING BEFORE A MEAL 30 capsule 0  . fluticasone (FLONASE) 50 MCG/ACT nasal spray Place 2 sprays into the nose daily.    . Fluticasone Furoate-Vilanterol (BREO ELLIPTA) 100-25 MCG/INH AEPB Inhale 1 puff into the lungs daily. 1 each 6  . gabapentin (NEURONTIN) 300 MG capsule Take 300 mg by mouth 3 (three) times daily.      Marland Kitchen ibuprofen (ADVIL,MOTRIN) 800 MG tablet Take 1 tablet (800 mg total) by mouth 3 (three) times daily. (Patient taking differently: Take 800 mg by mouth every 8 (eight) hours as needed for moderate pain. ) 21 tablet 0  . ipratropium (ATROVENT HFA) 17 MCG/ACT inhaler Inhale 2 puffs into the lungs every 4 (four) hours as needed for wheezing. 1 Inhaler 3  . pantoprazole (PROTONIX) 40 MG tablet Take 1 tablet (40 mg total) by mouth daily. 30 tablet 3  . pentosan polysulfate (ELMIRON) 100 MG capsule Take 100 mg by mouth 3 (three) times daily before meals.     . Tamsulosin HCl (FLOMAX) 0.4 MG CAPS Take 0.4 mg by mouth daily.     Marland Kitchen venlafaxine XR (EFFEXOR-XR) 150 MG 24 hr capsule Take 1 capsule (150 mg total) by mouth daily. 30 capsule 3    Objective: BP 142/85  mmHg  Pulse 100  Temp(Src) 98.2 F (36.8 C) (Oral)  Resp 17  SpO2 96% Exam: General: older AA male, alert, NAD, cooperative Head: normocephalic and atraumatic. Nodule appreciated on back of head on R. Eyes: vision grossly intact, PERRL, no injection and anicteric. Arcus senilis present bilaterally. Mouth: MMM, Oral mucosa and oropharynx reveal no lesions or exudates.  Lungs: Normal respiratory effort, no accessory muscle use, no crackles, expiratory wheezes appreciated in all lung fields. [place] in place. Heart: irregular rhythm, unable to appreciate if murmur present.  Abdomen: Bowel sounds normal; abdomen soft and nontender. No masses, organomegaly or hernias noted.  Extremities: No cyanosis, clubbing, edema Neurologic: No focal  deficits, CN grossly intact,+5 strength globally, patellar deep tendon reflexes symmetrical and normal. Skin: nodule noted above, no other skin lesions. Warm and dry. Psych: Mood and affect are normal; no evidence of anxiety or depression.  Labs and Imaging: Results for orders placed or performed during the hospital encounter of 07/07/14 (from the past 24 hour(s))  Comprehensive metabolic panel     Status: Abnormal   Collection Time: 07/07/14  1:43 AM  Result Value Ref Range   Sodium 133 (L) 135 - 145 mmol/L   Potassium 3.8 3.5 - 5.1 mmol/L   Chloride 96 96 - 112 mmol/L   CO2 27 19 - 32 mmol/L   Glucose, Bld 107 (H) 70 - 99 mg/dL   BUN 6 6 - 23 mg/dL   Creatinine, Ser 0.88 0.50 - 1.35 mg/dL   Calcium 9.3 8.4 - 10.5 mg/dL   Total Protein 6.8 6.0 - 8.3 g/dL   Albumin 3.7 3.5 - 5.2 g/dL   AST 46 (H) 0 - 37 U/L   ALT 72 (H) 0 - 53 U/L   Alkaline Phosphatase 52 39 - 117 U/L   Total Bilirubin 0.7 0.3 - 1.2 mg/dL   GFR calc non Af Amer 83 (L) >90 mL/min   GFR calc Af Amer >90 >90 mL/min   Anion gap 10 5 - 15  CBC with Differential/Platelet     Status: Abnormal   Collection Time: 07/07/14  1:43 AM  Result Value Ref Range   WBC 7.1 4.0 - 10.5 K/uL   RBC 3.47 (L) 4.22 - 5.81 MIL/uL   Hemoglobin 11.3 (L) 13.0 - 17.0 g/dL   HCT 33.4 (L) 39.0 - 52.0 %   MCV 96.3 78.0 - 100.0 fL   MCH 32.6 26.0 - 34.0 pg   MCHC 33.8 30.0 - 36.0 g/dL   RDW 13.2 11.5 - 15.5 %   Platelets 278 150 - 400 K/uL   Neutrophils Relative % 55 43 - 77 %   Neutro Abs 3.9 1.7 - 7.7 K/uL   Lymphocytes Relative 30 12 - 46 %   Lymphs Abs 2.1 0.7 - 4.0 K/uL   Monocytes Relative 14 (H) 3 - 12 %   Monocytes Absolute 1.0 0.1 - 1.0 K/uL   Eosinophils Relative 1 0 - 5 %   Eosinophils Absolute 0.1 0.0 - 0.7 K/uL   Basophils Relative 0 0 - 1 %   Basophils Absolute 0.0 0.0 - 0.1 K/uL   Dg Chest Port 1 View  07/07/2014   CLINICAL DATA:  Productive cough for 2 days.  Dyspnea.  EXAM: PORTABLE CHEST - 1 VIEW  COMPARISON:   01/20/2013  FINDINGS: There is focal airspace opacity in the lateral aspect of the right upper lobe. This may represent infectious infiltrate. Pulmonary vasculature is normal. No large effusion is evident. Hilar and  mediastinal contours are unremarkable and unchanged.  IMPRESSION: Right upper lobe infiltrate   Electronically Signed   By: Andreas Newport M.D.   On: 07/07/2014 02:44   EKG: sinus tach, rate 110, PACs, RBBB  Katheren Shams, DO 07/07/2014, 4:43 AM PGY-1, [facility] pager: [phone], text pages welcome  Upper Level Addendum:  I have seen and evaluated this patient along with Dr. Gerarda Fraction and reviewed the above note, making necessary revisions in red.   Tommi Rumps, MD Family Medicine PGY-2

## 2014-07-07 NOTE — Progress Notes (Signed)
UR completed 

## 2014-07-07 NOTE — ED Notes (Signed)
Per EMS, pt has been coughing up white phlegm x 2 days, with SOB. Pt reports hx of asthma, but denies COPD or cardiac hx. Pt has received a total of 10mg  Albuterol and 1mg  Atrovent en route, and 125mg  solumedrol en route. Per EMS, pt has been sinus tach on the monitor, with frequent PVC/PAC, runs of SVT, and a right BBB. Pt sats around 85% without O2.

## 2014-07-07 NOTE — Progress Notes (Signed)
FPTS Interim Progress Note  S: Stop by to see patient this morning. He states that he feels significantly better than he did upon admission. He asked many questions about what specifically was going on with his breathing. He asked if it was pneumonia. Patient also inquired about our treatment plans.  O: BP 122/58 mmHg  Pulse 100  Temp(Src) 97.9 F (36.6 C) (Oral)  Resp 16  Ht 6\' 1"  (1.854 m)  Wt 165 lb 9.1 oz (75.1 kg)  BMI 21.85 kg/m2  SpO2 96%  Chest: Wheezes/crackles noted in right lung base and middle lobe. Good effort. No increased work of breathing. Currently on nasal cannula 2 L.  A/P: COPD exacerbation: Patient currently on IV azithromycin and Rocephin per ED. Orders have been placed transition to IV Levaquin (which is currently hanging). If patient continues to improve we will likely transition to by mouth antibiotics tomorrow afternoon. Patient also on prednisone 40 mg daily. Will continue this for 5 days.  Elberta Leatherwood, MD 07/07/2014, 10:04 AM PGY-1, [place]

## 2014-07-07 NOTE — ED Notes (Signed)
Admitting team at bedside.

## 2014-07-07 NOTE — Progress Notes (Signed)
Arrival Method: Patient arrived in stretcher from ED. Mental Orientation: alert and oriented x 4  // hoh Telemetry:25  Assessment: See Doc Flow sheets. Skin: Warm, dry and intact. IV: Peripheral I  Left a/c Pain:. None at present time Fall Prevention Safety Plan: Patient educated about fall prevention safety plan, understood and acknowledged. Admission Screening:6700 Orientation: Patient has been oriented to the unit, staff and to the room.

## 2014-07-07 NOTE — ED Provider Notes (Signed)
CSN: [id number]     Arrival date & time 07/07/14  0108 History  This chart was scribed for Fredia Sorrow, MD by Delphia Grates, ED Scribe. This patient was seen in room A13C/A13C and the patient's care was started at 1:26 AM.   Chief Complaint  Patient presents with  . Respiratory Distress    Patient is a 74 y.o. male presenting with shortness of breath. The history is provided by the patient and medical records. No language interpreter was used.  Shortness of Breath Severity:  Moderate Onset quality:  Gradual Duration:  2 days Timing:  Constant Progression:  Worsening Chronicity:  New Relieved by:  Nothing Associated symptoms: cough and wheezing   Associated symptoms: no abdominal pain, no chest pain, no fever, no headaches, no neck pain, no rash, no sore throat and no vomiting      HPI Comments: Steven Beasley is a 74 y.o. male, with history of HTN, asthma, and SOB, brought in by ambulance, who presents to the Emergency Department for worsening SOB for the past 2 days that has gotten worse tonight. Patient states he develops cold symptoms frequently and notes associated productive cough (white sputum), wheezing, congestion, and rhinorrhea.  He reports taking albuterol, but denies any steroids. However, per nursing note, EMS gave 10mg  Albuterol, 1mg  Atrovent, and 125mg  Solu-Medrol en route to ED. Patient states he is not on home O2. He notes history of A-fib 3 years ago in 2013, but is currently not on any blood thinners. Patient denies fever, chills, sore throat, chest pain, leg swelling, visual disturbances, abdominal pain, nausea, vomiting, diarrhea, dysuria, back pain, neck pain, rash, dizziness, headache, or light-headedness. Patient is on 2L of O2 with saturation at 95% at this time. Per nursing note, patient sats ~85% without O2.   Past Medical History  Diagnosis Date  . Hypertension   . Leukocytopenia   . Asthma   . Abdominal pain, chronic, generalized   . GERD  (gastroesophageal reflux disease)   . BPH (benign prostatic hyperplasia)   . 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 Shortness of breath    Past Surgical History  Procedure Laterality Date  . Video bronchoscopy Bilateral 10/28/2012    Procedure: VIDEO BRONCHOSCOPY WITHOUT FLUORO;  Surgeon: Rigoberto Noel, MD;  Location: [facility];  Service: Cardiopulmonary;  Laterality: Bilateral;   History reviewed. No pertinent family history. History  Substance Use Topics  . Smoking status: Former Smoker -- 0.00 packs/day for 0 years    Quit date: 11/09/2010  . Smokeless tobacco: Never Used  . Alcohol Use: 3.6 oz/week    6 Cans of beer per week     Comment: ocasional on weekends    Review of Systems  Constitutional: Negative for fever and chills.  HENT: Positive for congestion and rhinorrhea. Negative for sore throat.   Eyes: Negative for visual disturbance.  Respiratory: Positive for cough, shortness of breath and wheezing.   Cardiovascular: Negative for chest pain and leg swelling.  Gastrointestinal: Negative for nausea, vomiting, abdominal pain and diarrhea.  Genitourinary: Negative for dysuria.  Musculoskeletal: Negative for back pain and neck pain.  Skin: Negative for rash.  Neurological: Negative for dizziness, light-headedness and headaches.  Hematological: Does not bruise/bleed easily.  Psychiatric/Behavioral: Negative for confusion.      Allergies  Review of patient's allergies indicates no known allergies.  Home Medications   Prior to Admission medications   Medication Sig Start Date End Date Taking? Authorizing Provider  albuterol (PROVENTIL HFA;VENTOLIN HFA) 108 (90 BASE)  MCG/ACT inhaler Inhale 2 puffs into the lungs every 6 (six) hours as needed for wheezing or shortness of breath. 07/02/14  Yes Elberta Leatherwood, MD  albuterol (PROVENTIL) (2.5 MG/3ML) 0.083% nebulizer solution Take 3 mLs (2.5 mg total) by nebulization every 4 (four) hours as needed for wheezing or shortness of breath  (dx J44.9). 04/11/14  Yes Rigoberto Noel, MD  amLODipine (NORVASC) 10 MG tablet Take 1 tablet (10 mg total) by mouth daily. 06/06/14  Yes Rigoberto Noel, MD  diazepam (VALIUM) 5 MG tablet Take 1 tablet (5 mg total) by mouth 2 (two) times daily. 12/30/13  Yes Cleatrice Burke, PA-C  esomeprazole (NEXIUM) 40 MG capsule Take 40 mg by mouth daily. 04/23/14  Yes Historical Provider, MD  esomeprazole (NEXIUM) 40 MG capsule TAKE ONE CAPSULE BY MOUTH EVERY MORNING BEFORE A MEAL 07/03/14  Yes Rigoberto Noel, MD  fluticasone (FLONASE) 50 MCG/ACT nasal spray Place 2 sprays into the nose daily. 09/15/12  Yes Doran Heater, MD  Fluticasone Furoate-Vilanterol (BREO ELLIPTA) 100-25 MCG/INH AEPB Inhale 1 puff into the lungs daily. 04/11/14  Yes Rigoberto Noel, MD  gabapentin (NEURONTIN) 300 MG capsule Take 300 mg by mouth 3 (three) times daily.     Yes Historical Provider, MD  ibuprofen (ADVIL,MOTRIN) 800 MG tablet Take 1 tablet (800 mg total) by mouth 3 (three) times daily. Patient taking differently: Take 800 mg by mouth every 8 (eight) hours as needed for moderate pain.  12/30/13  Yes Cleatrice Burke, PA-C  ipratropium (ATROVENT HFA) 17 MCG/ACT inhaler Inhale 2 puffs into the lungs every 4 (four) hours as needed for wheezing. 06/13/14  Yes Rigoberto Noel, MD  pantoprazole (PROTONIX) 40 MG tablet Take 1 tablet (40 mg total) by mouth daily. 06/13/14  Yes Rigoberto Noel, MD  pentosan polysulfate (ELMIRON) 100 MG capsule Take 100 mg by mouth 3 (three) times daily before meals.    Yes Historical Provider, MD  Tamsulosin HCl (FLOMAX) 0.4 MG CAPS Take 0.4 mg by mouth daily.    Yes Historical Provider, MD  venlafaxine XR (EFFEXOR-XR) 150 MG 24 hr capsule Take 1 capsule (150 mg total) by mouth daily. 07/04/14  Yes Elberta Leatherwood, MD   Triage Vitals: BP 152/87 mmHg  Pulse 113  Temp(Src) 98.2 F (36.8 C) (Oral)  Resp 19  SpO2 97%  Physical Exam  Constitutional: He is oriented to person, place, and time. He appears well-developed and  well-nourished. No distress.  HENT:  Head: Normocephalic and atraumatic.  Mucous membranes are moist.  Eyes: Conjunctivae and EOM are normal.  Pupils normal. Sclera clear. Eyes track normally.  Neck: Neck supple. No tracheal deviation present.  Cardiovascular: An irregular rhythm present. Tachycardia present.   Pulmonary/Chest: Effort normal. No respiratory distress. He has wheezes.  Bilateral wheezing.  Abdominal: Soft. Bowel sounds are normal. There is no tenderness.  Musculoskeletal: Normal range of motion.  No swelling in the ankles.  Neurological: He is alert and oriented to person, place, and time. No cranial nerve deficit. He exhibits normal muscle tone. Coordination normal.  Skin: Skin is warm and dry.  Psychiatric: He has a normal mood and affect. His behavior is normal.  Nursing note and vitals reviewed.   ED Course  Procedures (including critical care time)  DIAGNOSTIC STUDIES: Oxygen Saturation is 97% on 2L [place], adequate by my interpretation.    COORDINATION OF CARE: At 0130 Discussed treatment plan with patient. Patient agrees.   Medications  0.9 %  sodium  chloride infusion (75 mL/hr Intravenous New Bag/Given 07/07/14 0300)  methylPREDNISolone sodium succinate (SOLU-MEDROL) 125 mg/2 mL injection 125 mg (125 mg Intravenous Not Given 07/07/14 0201)  cefTRIAXone (ROCEPHIN) 1 g in dextrose 5 % 50 mL IVPB (not administered)  azithromycin (ZITHROMAX) 500 mg in dextrose 5 % 250 mL IVPB (not administered)  0.9 %  sodium chloride infusion (not administered)  ipratropium-albuterol (DUONEB) 0.5-2.5 (3) MG/3ML nebulizer solution 3 mL (3 mLs Nebulization Given 07/07/14 0159)  sodium chloride 0.9 % bolus 500 mL (0 mLs Intravenous Stopped 07/07/14 0300)     Labs Review Labs Reviewed  COMPREHENSIVE METABOLIC PANEL - Abnormal; Notable for the following:    Sodium 133 (*)    Glucose, Bld 107 (*)    AST 46 (*)    ALT 72 (*)    GFR calc non Af Amer 83 (*)    All other components  within normal limits  CBC WITH DIFFERENTIAL/PLATELET - Abnormal; Notable for the following:    RBC 3.47 (*)    Hemoglobin 11.3 (*)    HCT 33.4 (*)    Monocytes Relative 14 (*)    All other components within normal limits  CULTURE, BLOOD (ROUTINE X 2)  CULTURE, BLOOD (ROUTINE X 2)  I-STAT ARTERIAL BLOOD GAS, ED   Results for orders placed or performed during the hospital encounter of 07/07/14  Comprehensive metabolic panel  Result Value Ref Range   Sodium 133 (L) 135 - 145 mmol/L   Potassium 3.8 3.5 - 5.1 mmol/L   Chloride 96 96 - 112 mmol/L   CO2 27 19 - 32 mmol/L   Glucose, Bld 107 (H) 70 - 99 mg/dL   BUN 6 6 - 23 mg/dL   Creatinine, Ser 0.88 0.50 - 1.35 mg/dL   Calcium 9.3 8.4 - 10.5 mg/dL   Total Protein 6.8 6.0 - 8.3 g/dL   Albumin 3.7 3.5 - 5.2 g/dL   AST 46 (H) 0 - 37 U/L   ALT 72 (H) 0 - 53 U/L   Alkaline Phosphatase 52 39 - 117 U/L   Total Bilirubin 0.7 0.3 - 1.2 mg/dL   GFR calc non Af Amer 83 (L) >90 mL/min   GFR calc Af Amer >90 >90 mL/min   Anion gap 10 5 - 15  CBC with Differential/Platelet  Result Value Ref Range   WBC 7.1 4.0 - 10.5 K/uL   RBC 3.47 (L) 4.22 - 5.81 MIL/uL   Hemoglobin 11.3 (L) 13.0 - 17.0 g/dL   HCT 33.4 (L) 39.0 - 52.0 %   MCV 96.3 78.0 - 100.0 fL   MCH 32.6 26.0 - 34.0 pg   MCHC 33.8 30.0 - 36.0 g/dL   RDW 13.2 11.5 - 15.5 %   Platelets 278 150 - 400 K/uL   Neutrophils Relative % 55 43 - 77 %   Neutro Abs 3.9 1.7 - 7.7 K/uL   Lymphocytes Relative 30 12 - 46 %   Lymphs Abs 2.1 0.7 - 4.0 K/uL   Monocytes Relative 14 (H) 3 - 12 %   Monocytes Absolute 1.0 0.1 - 1.0 K/uL   Eosinophils Relative 1 0 - 5 %   Eosinophils Absolute 0.1 0.0 - 0.7 K/uL   Basophils Relative 0 0 - 1 %   Basophils Absolute 0.0 0.0 - 0.1 K/uL     Imaging Review Dg Chest Port 1 View  07/07/2014   CLINICAL DATA:  Productive cough for 2 days.  Dyspnea.  EXAM: PORTABLE CHEST - 1 VIEW  COMPARISON:  01/20/2013  FINDINGS: There is focal airspace opacity in the lateral  aspect of the right upper lobe. This may represent infectious infiltrate. Pulmonary vasculature is normal. No large effusion is evident. Hilar and mediastinal contours are unremarkable and unchanged.  IMPRESSION: Right upper lobe infiltrate   Electronically Signed   By: Andreas Newport M.D.   On: 07/07/2014 02:44     EKG Interpretation   Date/Time:  Saturday July 07 2014 01:15:38 EDT Ventricular Rate:  110 PR Interval:  125 QRS Duration: 124 QT Interval:  323 QTC Calculation: 437 R Axis:   -95 Text Interpretation:  Sinus tachycardia Atrial premature complexes Right  bundle branch block Anteroseptal infarct, age indeterminate ST elevation,  consider inferior injury Lateral leads are also involved ? atrial fib  Confirmed by Krista Som  MD, Danali Marinos [phone]) on 07/07/2014 1:33:08 AM      MDM   Final diagnoses:  SOB (shortness of breath)  CAP (community acquired pneumonia)  COPD exacerbation    Patient brought in by EMS for an exacerbation of what was thought to be a COPD. Patient was using his albuterol nebulizer at home. Patient received albuterol Atrovent in route by EMS. Still wheezing upon arrival. Also given 125 mg IV Medrol in route. Patient was some tachycardia heart rate up to 120s. Seem to be a sinus tach. But on monitor was a little suspicious maybe for atrial fibrillation. Patient without any significant chest pain. Did admit to a productive cough but denied any fevers.  Chest x-rays consistent with a pneumonia. Patient had been treated with albuterol Atrovent nebulizer prior to that with wrist resolution of the wheezing. Patient breathing was feeling better. Patient also had an oxygen requirement of 2 L with sat down to 88% when it was turned off. This was upon arrival. Patient is normally not on oxygen.  For the pneumonia would be a community-acquired pneumonia patient lives at home by herself is not recently been admitted. Patient started on protocol antibiotics. Blood culture  sent and are pending. A she is still working a little hard to breathe but is mentating fine. Arterial blood gas is pending. Patient is followed by family practice. Admission arranged.    I personally performed the services described in this documentation, which was scribed in my presence. The recorded information has been reviewed and is accurate.     Fredia Sorrow, MD 07/07/14 [phone]

## 2014-07-08 MED ORDER — LEVOFLOXACIN 500 MG PO TABS
500.0000 mg | ORAL_TABLET | Freq: Every day | ORAL | Status: DC
  Administered 2014-07-08 – 2014-07-12 (×5): 500 mg via ORAL
  Filled 2014-07-08 (×5): qty 1

## 2014-07-08 NOTE — Progress Notes (Signed)
Family Medicine Teaching Service Daily Progress Note Intern Pager: [phone]  Patient name: Steven Beasley Medical record number: [id number] Date of birth: March 13, 1941 Age: 74 y.o. Gender: male  Primary Care Provider: Alease Frame [name], MD Consultants: None Code Status: Full   Assessment and Plan: [name] is a 74 y.o. male presenting with shortness of breath. PMH is significant for HTN, COPD, BPH, GERD, smoldering myeloma.  COPD exacerbation/ CAP- Pt with increased dyspnea/sputum production/cough.  C/W COPD exacerbation with new O2 requirement - Continue with duonebs, space to PRN - Continue with prednisone burst and Levaquin (covering possible CAP) - Consider restarting home meds for control  - PT/OT eval   HTN: Normotensive. -continue home norvasc  #Smoldering Myeloma - Followed by oncology, stable - Monitor CBC   FEN/GI: HH diet, SLIV PPx: SQ heparin   Disposition: Telemetry pending further improvement   Subjective:  Improved, no acute complaints   Objective: Temp:  [97.3 F (36.3 C)-97.8 F (36.6 C)] 97.5 F (36.4 C) (03/20 0612) Pulse Rate:  [89-94] 90 (03/20 0612) Resp:  [16-18] 18 (03/20 0612) BP: (120-135)/(59-84) 131/84 mmHg (03/20 0612) SpO2:  [93 %-99 %] 93 % (03/20 0612) Physical Exam: General: NAD Cardiovascular: Irregular irregular, no murmurs appreciated  Respiratory: Normal respiratory effort, no accessory muscle use, no crackles, expiratory wheezes appreciated in all lung fields.  in place. Abdomen: Soft/NT/ND, NABS Extremities: No edema   Laboratory:  Recent Labs Lab 07/02/14 1630 07/07/14 0143 07/07/14 0720  WBC 3.6* 7.1 5.3  HGB 11.9* 11.3* 11.0*  HCT 35.6* 33.4* 32.4*  PLT 227 278 279    Recent Labs Lab 07/02/14 1630 07/07/14 0143 07/07/14 0720  NA 137 133*  --   K 4.2 3.8  --   CL 100 96  --   CO2 32 27  --   BUN 14 6  --   CREATININE 1.05 0.88 0.87  CALCIUM 9.3 9.3  --   PROT  --  6.8  --   BILITOT  --  0.7  --    ALKPHOS  --  52  --   ALT  --  72*  --   AST  --  46*  --   GLUCOSE 84 107*  --     EKG - Sinus tach, PAC, RBBB  Imaging/Diagnostic Tests: Dg Chest Port 1 View 07/07/2014  IMPRESSION: Right upper lobe infiltrate Electronically Signed By: Andreas Newport M.D. On: 07/07/2014 02:44   Nolon Rod, DO 07/08/2014, 8:35 AM PGY-3, [place] Intern pager: [phone], text pages welcome

## 2014-07-09 ENCOUNTER — Inpatient Hospital Stay (HOSPITAL_COMMUNITY): Payer: Commercial Managed Care - HMO

## 2014-07-09 ENCOUNTER — Encounter (HOSPITAL_COMMUNITY): Payer: Self-pay | Admitting: *Deleted

## 2014-07-09 DIAGNOSIS — J441 Chronic obstructive pulmonary disease with (acute) exacerbation: Principal | ICD-10-CM

## 2014-07-09 DIAGNOSIS — R0600 Dyspnea, unspecified: Secondary | ICD-10-CM

## 2014-07-09 DIAGNOSIS — J9602 Acute respiratory failure with hypercapnia: Secondary | ICD-10-CM

## 2014-07-09 DIAGNOSIS — R0902 Hypoxemia: Secondary | ICD-10-CM | POA: Insufficient documentation

## 2014-07-09 DIAGNOSIS — R0602 Shortness of breath: Secondary | ICD-10-CM | POA: Insufficient documentation

## 2014-07-09 LAB — BLOOD GAS, ARTERIAL
ACID-BASE EXCESS: 5.8 mmol/L — AB (ref 0.0–2.0)
Bicarbonate: 31.3 mEq/L — ABNORMAL HIGH (ref 20.0–24.0)
DRAWN BY: 103701
O2 CONTENT: 2.5 L/min
O2 Saturation: 90.1 %
PATIENT TEMPERATURE: 98.6
TCO2: 33.1 mmol/L (ref 0–100)
pCO2 arterial: 59.3 mmHg (ref 35.0–45.0)
pH, Arterial: 7.342 — ABNORMAL LOW (ref 7.350–7.450)
pO2, Arterial: 63.3 mmHg — ABNORMAL LOW (ref 80.0–100.0)

## 2014-07-09 LAB — D-DIMER, QUANTITATIVE: D-Dimer, Quant: 0.55 ug/mL-FEU — ABNORMAL HIGH (ref 0.00–0.48)

## 2014-07-09 LAB — LACTIC ACID, PLASMA
LACTIC ACID, VENOUS: 1.1 mmol/L (ref 0.5–2.0)
Lactic Acid, Venous: 1 mmol/L (ref 0.5–2.0)

## 2014-07-09 LAB — BASIC METABOLIC PANEL
ANION GAP: 7 (ref 5–15)
BUN: 10 mg/dL (ref 6–23)
CO2: 32 mmol/L (ref 19–32)
Calcium: 8.6 mg/dL (ref 8.4–10.5)
Chloride: 94 mmol/L — ABNORMAL LOW (ref 96–112)
Creatinine, Ser: 0.8 mg/dL (ref 0.50–1.35)
GFR, EST NON AFRICAN AMERICAN: 87 mL/min — AB (ref >90)
Glucose, Bld: 95 mg/dL (ref 70–99)
Potassium: 4.4 mmol/L (ref 3.5–5.1)
SODIUM: 133 mmol/L — AB (ref 135–145)

## 2014-07-09 LAB — BRAIN NATRIURETIC PEPTIDE: B NATRIURETIC PEPTIDE 5: 493.8 pg/mL — AB (ref 0.0–100.0)

## 2014-07-09 MED ORDER — BUDESONIDE 0.25 MG/2ML IN SUSP
0.5000 mg | Freq: Two times a day (BID) | RESPIRATORY_TRACT | Status: DC
  Administered 2014-07-10 – 2014-07-15 (×9): 0.5 mg via RESPIRATORY_TRACT
  Filled 2014-07-09 (×16): qty 4

## 2014-07-09 MED ORDER — METHYLPREDNISOLONE SODIUM SUCC 125 MG IJ SOLR
60.0000 mg | Freq: Two times a day (BID) | INTRAMUSCULAR | Status: DC
  Administered 2014-07-09 – 2014-07-10 (×3): 60 mg via INTRAVENOUS
  Filled 2014-07-09: qty 2
  Filled 2014-07-09: qty 0.96
  Filled 2014-07-09: qty 2
  Filled 2014-07-09: qty 0.96

## 2014-07-09 MED ORDER — IOHEXOL 350 MG/ML SOLN
100.0000 mL | Freq: Once | INTRAVENOUS | Status: AC | PRN
  Administered 2014-07-09: 100 mL via INTRAVENOUS

## 2014-07-09 NOTE — Progress Notes (Signed)
Rapid response at the bedside.

## 2014-07-09 NOTE — Progress Notes (Signed)
  Echocardiogram 2D Echocardiogram has been performed.  Steven Beasley 07/09/2014, 1:09 PM

## 2014-07-09 NOTE — Progress Notes (Signed)
CRITICAL VALUE ALERT  Critical value received:   ABG'S  Date of notification:   07/09/2014  Time of notification:   4835  Critical value read back   YES  Nurse who received alert:   Maryruth Hancock  MD notified (1st page):  Yes-Will wait for other lab result and ct,no order  received at this time.  Time of first page:   1241  MD notified (2nd page):    Time of second page:      Responding MD:  Dr. Alease Frame  Time MD responded:  [phone]

## 2014-07-09 NOTE — Progress Notes (Signed)
OT Cancellation Note  Patient Details Name: Steven Beasley MRN: [id number] DOB: 07/16/1940   Cancelled Treatment:    Reason Eval/Treat Not Completed: Patient at procedure or test/ unavailable Having echo. Will return later as able.  [place], OTR/L  [phone] 07/09/2014 07/09/2014, 12:27 PM

## 2014-07-09 NOTE — Progress Notes (Signed)
Rapid response assessed the patient.

## 2014-07-09 NOTE — Progress Notes (Signed)
Family Medicine Teaching Service Daily Progress Note Intern Pager: [phone]  Patient name: Steven Beasley Medical record number: [id number] Date of birth: 11/20/1940 Age: 74 y.o. Gender: male  Primary Care Provider: Alease Frame [name], MD Consultants: Fatima Sanger Code Status: Full   Assessment and Plan: Steven Beasley is a 74 y.o. male presenting with shortness of breath. PMH is significant for HTN, COPD, BPH, GERD, smoldering myeloma.  COPD exacerbation/ CAP- Pt with increased dyspnea/sputum production/cough.  C/W COPD exacerbation with new O2 requirement - Continue with duonebs, space to PRN - Continue with prednisone burst and Levaquin (covering possible CAP) - Acute O2 desaturations at AM 3/21  - Repeat CXR  - STAT ABG, Lactic Acid, EKG  - Consulted Pulm >> appreciate recs >> Solumedrol 60mg  BID  - Echo - PT/OT eval   HTN: Normotensive. -continue home norvasc  #Smoldering Myeloma - Followed by oncology, stable - Monitor CBC   FEN/GI: HH diet, SLIV PPx: SQ heparin   Disposition: Telemetry pending further improvement   Subjective:  Worsening SOB  Objective: Temp:  [97.7 F (36.5 C)-98.4 F (36.9 C)] 98.4 F (36.9 C) (03/21 0827) Pulse Rate:  [82-100] 100 (03/21 0827) Resp:  [18-23] 21 (03/21 0827) BP: (129-152)/(72-87) 144/82 mmHg (03/21 0827) SpO2:  [86 %-100 %] 86 % (03/21 0827) Physical Exam: General: NAD, diaphoretic Cardiovascular: Irregular irregular, no murmurs appreciated  Respiratory: increased respiratory effort/accessory muscle use, no crackles, expiratory wheezes appreciated in all lung fields. [place] in place. Abdomen: Soft/NT/ND, NABS Extremities: No edema   Laboratory:  Recent Labs Lab 07/02/14 1630 07/07/14 0143 07/07/14 0720  WBC 3.6* 7.1 5.3  HGB 11.9* 11.3* 11.0*  HCT 35.6* 33.4* 32.4*  PLT 227 278 279    Recent Labs Lab 07/02/14 1630 07/07/14 0143 07/07/14 0720  NA 137 133*  --   K 4.2 3.8  --   CL 100 96  --   CO2 32 27  --   BUN  14 6  --   CREATININE 1.05 0.88 0.87  CALCIUM 9.3 9.3  --   PROT  --  6.8  --   BILITOT  --  0.7  --   ALKPHOS  --  52  --   ALT  --  72*  --   AST  --  46*  --   GLUCOSE 84 107*  --    EKG - Sinus tach, PAC, RBBB  Imaging/Diagnostic Tests: Dg Chest Port 1 View 07/07/2014  IMPRESSION: Right upper lobe infiltrate Electronically Signed By: Andreas Newport M.D. On: 07/07/2014 02:44   CXR 3/21 IMPRESSION: Hyperinflation again noted. Streaky right middle lobe atelectasis or infiltrate. No pulmonary edema.   Elberta Leatherwood, MD 07/09/2014, 12:11 PM PGY-1, [place] Intern pager: [phone], text pages welcome

## 2014-07-09 NOTE — Consult Note (Signed)
Name: Steven Beasley MRN: [id number] DOB: 05-27-1940    ADMISSION DATE:  07/07/2014 CONSULTATION DATE:  3/21  REFERRING MD :  Lindell Noe (FPTS)  CHIEF COMPLAINT:  Dyspnea, COPD   BRIEF PATIENT DESCRIPTION: 74yo male with hx Gold D COPD, still smoking, hx GERD, smoldering myeloma presented 3/19 with worsening SOB and increased inhaler usage x 2-3 days.  Was admitted by FPTS with AECOPD and RUL CAP.  Treated with IV steroids, IV abx, BD's.  He was improving slowly but became increasingly SOB 3/21 am and PCCM consulted to assist.   SIGNIFICANT EVENTS    STUDIES:     HISTORY OF PRESENT ILLNESS: 74yo male with hx Gold D COPD, still smoking, hx GERD, smoldering myeloma presented 3/19 with worsening SOB and increased inhaler usage x 2-3 days.  Was admitted by FPTS with AECOPD and RUL CAP.  Treated with IV steroids, IV abx, BD's.  He was improving slowly but became increasingly SOB 3/21 am and PCCM consulted to assist.  C/o increased SOB, congestion/cough, fatigue.  Denies chest pain, purulent sputum, hemoptysis, lightheadedness, recent travel, orthopnea or edema.   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  has past surgical history that includes Video bronchoscopy (Bilateral, 10/28/2012). Prior to Admission medications   Medication Sig Start Date End Date Taking? Authorizing Provider  albuterol (PROVENTIL HFA;VENTOLIN HFA) 108 (90 BASE) MCG/ACT inhaler Inhale 2 puffs into the lungs every 6 (six) hours as needed for wheezing or shortness of breath. 07/02/14  Yes Elberta Leatherwood, MD  albuterol (PROVENTIL) (2.5 MG/3ML) 0.083% nebulizer solution Take 3 mLs (2.5 mg total) by nebulization every 4 (four) hours as needed for wheezing or shortness of breath (dx J44.9). 04/11/14  Yes Rigoberto Noel, MD  amLODipine (NORVASC) 10 MG tablet Take 1 tablet (10 mg total) by mouth daily. 06/06/14  Yes Rigoberto Noel, MD  diazepam (VALIUM) 5 MG tablet Take 1 tablet (5 mg total) by mouth 2 (two) times daily. 12/30/13  Yes Cleatrice Burke, PA-C  esomeprazole (NEXIUM) 40 MG capsule Take 40 mg by mouth daily. 04/23/14  Yes Historical Provider, MD  esomeprazole (NEXIUM) 40 MG capsule TAKE ONE CAPSULE BY MOUTH EVERY MORNING BEFORE A MEAL 07/03/14  Yes Rigoberto Noel, MD  fluticasone (FLONASE) 50 MCG/ACT nasal spray Place 2 sprays into the nose daily. 09/15/12  Yes Doran Heater, MD  Fluticasone Furoate-Vilanterol (BREO ELLIPTA) 100-25 MCG/INH AEPB Inhale 1 puff into the lungs daily. 04/11/14  Yes Rigoberto Noel, MD  gabapentin (NEURONTIN) 300 MG capsule Take 300 mg by mouth 3 (three) times daily.     Yes Historical Provider, MD  ibuprofen (ADVIL,MOTRIN) 800 MG tablet Take 1 tablet (800 mg total) by mouth 3 (three) times daily. Patient taking differently: Take 800 mg by mouth every 8 (eight) hours as needed for moderate pain.  12/30/13  Yes Cleatrice Burke, PA-C  ipratropium (ATROVENT HFA) 17 MCG/ACT inhaler Inhale 2 puffs into the lungs every 4 (four) hours as needed for wheezing. 06/13/14  Yes Rigoberto Noel, MD  pantoprazole (PROTONIX) 40 MG tablet Take 1 tablet (40 mg total) by mouth daily. 06/13/14  Yes Rigoberto Noel, MD  pentosan polysulfate (ELMIRON) 100 MG capsule Take 100 mg by mouth 3 (three) times daily before meals.    Yes Historical Provider, MD  Tamsulosin HCl (FLOMAX)  0.4 MG CAPS Take 0.4 mg by mouth daily.    Yes Historical Provider, MD  venlafaxine XR (EFFEXOR-XR) 150 MG 24 hr capsule Take 1 capsule (150 mg total) by mouth daily. 07/04/14  Yes Elberta Leatherwood, MD   No Known Allergies  FAMILY HISTORY:  family history is not on file. SOCIAL HISTORY:  reports that he quit smoking about 3 years ago. He has never used smokeless tobacco. He reports that he drinks about 3.6 oz of alcohol per week. He  reports that he does not use illicit drugs.  REVIEW OF SYSTEMS:   As per HPI - All other systems reviewed and were neg.    SUBJECTIVE:   VITAL SIGNS: Temp:  [97.7 F (36.5 C)-98.4 F (36.9 C)] 98.4 F (36.9 C) (03/21 0827) Pulse Rate:  [82-100] 100 (03/21 0827) Resp:  [18-23] 21 (03/21 0827) BP: (129-152)/(72-87) 144/82 mmHg (03/21 0827) SpO2:  [86 %-100 %] 86 % (03/21 0827)  PHYSICAL EXAMINATION: General:  Pleasant, chronically ill appearing male, NAD  Neuro:  Awake, alert, appropriate, talking on the phone, MAE  HEENT:  Mm dry, poor dentition, no JVD  Cardiovascular:  s1s2 rrr Lungs:  resps even, mildly labored with talking or exertion, non labored at rest, prolonged exhalation, diffuse exp wheeze  Abdomen:  Soft, non tender, +bs  Musculoskeletal:  Warm and dry, no edema    Recent Labs Lab 07/02/14 1630 07/07/14 0143 07/07/14 0720  NA 137 133*  --   K 4.2 3.8  --   CL 100 96  --   CO2 32 27  --   BUN 14 6  --   CREATININE 1.05 0.88 0.87  GLUCOSE 84 107*  --     Recent Labs Lab 07/02/14 1630 07/07/14 0143 07/07/14 0720  HGB 11.9* 11.3* 11.0*  HCT 35.6* 33.4* 32.4*  WBC 3.6* 7.1 5.3  PLT 227 278 279   Dg Chest 2 View  07/09/2014   CLINICAL DATA:  Shortness of breath, cough, hypoxia  EXAM: CHEST  2 VIEW  COMPARISON:  07/07/2014  FINDINGS: Cardiomediastinal silhouette is stable. There is streaky right middle lobe atelectasis or early infiltrate. No pulmonary edema. Hyperinflation again noted. Mild degenerative changes lower thoracic spine.  IMPRESSION: Hyperinflation again noted. Streaky right middle lobe atelectasis or infiltrate. No pulmonary edema.   Electronically Signed   By: Lahoma Crocker M.D.   On: 07/09/2014 10:36    ASSESSMENT / PLAN:  AECOPD  CAP  Tobacco abuse  Hx GERD   PLAN -  Supplemental O2 as needed to keep sats 88-92% Change back to IV steroids  Cont PO levaquin  PPI  SMOKING CESSATION duonebs q6h with flutter  Hold symbicort for now    Budesonide neb BID PRN albuterol  Oral care  OK to stay on floor for now  If worsens, consider tx SDU for bipap - hold off for now.   See discussion below    Discussed at length with patient.  He has Gold D COPD at baseline.  He would very much like to get better and go back home to his normal level of functioning, caring for himself, going to church etc.  However he would not want to be intubated at any time and has discussed this before with family.  Will make DNI.  Cont full, aggressive medical care short of intubation/mech ventilation.    Nickolas Madrid, NP 07/09/2014  11:30 AM Pager: [phone] or [phone]   PCCM ATTENDING: I have reviewed pt's  initial presentation, consultants notes and hospital database in detail.  The above assessment and plan was formulated under my direction.  In summary: Admitted 3/19 with history compatible with AECOPD. He has been appropriately treated for such.  We were asked to see for worsening symptoms this AM At the time that I saw him, he was in no overt distress and felt that he was improving Subsequently, he was transferred to ICU for recurrent worsening of dyspnea His CTA chest has been reviewed and reveals no PE. There is very scant RUL and RML infiltrate It appears that the major problem is severe copd with acute exacerbation perhaps triggered by an acute bacterial or viral infection  Recs as above  Care plan has been reviewed and modified  Merton Border, MD;  PCCM service; Mobile [phone]

## 2014-07-09 NOTE — Progress Notes (Signed)
OT Cancellation Note  Patient Details Name: Steven Beasley MRN: [id number] DOB: 05/18/1940   Cancelled Treatment:    Reason Eval/Treat Not Completed: Patient at procedure or test/ unavailable Pt with RT. Will return later. Jet, OTR/L  [phone] 07/09/2014 07/09/2014, 12:15 PM

## 2014-07-09 NOTE — Progress Notes (Signed)
Follow up on patient with SDU request - patient alert - NAD - resps reg and unlabored - O2 sats 95% - has been seen by PCCM who changed BIpap order to prn.  Patient speaking full sentences - no SOB - warm and dry.  Spoke with RN Gwenlyn Perking - patient stable at this time.  Will move to SDU as bed becomes available.  RN to call if patient gets in distress and we will rescue Bipap and transfer.

## 2014-07-10 DIAGNOSIS — I5020 Unspecified systolic (congestive) heart failure: Secondary | ICD-10-CM

## 2014-07-10 LAB — BASIC METABOLIC PANEL
Anion gap: 8 (ref 5–15)
BUN: 7 mg/dL (ref 6–23)
CHLORIDE: 90 mmol/L — AB (ref 96–112)
CO2: 33 mmol/L — AB (ref 19–32)
Calcium: 8.7 mg/dL (ref 8.4–10.5)
Creatinine, Ser: 0.68 mg/dL (ref 0.50–1.35)
GFR calc Af Amer: >90 mL/min (ref >90)
Glucose, Bld: 108 mg/dL — ABNORMAL HIGH (ref 70–99)
POTASSIUM: 4.1 mmol/L (ref 3.5–5.1)
SODIUM: 131 mmol/L — AB (ref 135–145)

## 2014-07-10 LAB — CBC
HEMATOCRIT: 34 % — AB (ref 39.0–52.0)
Hemoglobin: 11.6 g/dL — ABNORMAL LOW (ref 13.0–17.0)
MCH: 32.5 pg (ref 26.0–34.0)
MCHC: 34.1 g/dL (ref 30.0–36.0)
MCV: 95.2 fL (ref 78.0–100.0)
Platelets: 320 10*3/uL (ref 150–400)
RBC: 3.57 MIL/uL — ABNORMAL LOW (ref 4.22–5.81)
RDW: 12.2 % (ref 11.5–15.5)
WBC: 5.1 10*3/uL (ref 4.0–10.5)

## 2014-07-10 LAB — MRSA PCR SCREENING: MRSA by PCR: NEGATIVE

## 2014-07-10 MED ORDER — FUROSEMIDE 10 MG/ML IJ SOLN
20.0000 mg | Freq: Once | INTRAMUSCULAR | Status: AC
  Administered 2014-07-10: 20 mg via INTRAVENOUS
  Filled 2014-07-10: qty 2

## 2014-07-10 MED ORDER — IPRATROPIUM-ALBUTEROL 0.5-2.5 (3) MG/3ML IN SOLN
3.0000 mL | RESPIRATORY_TRACT | Status: DC
  Administered 2014-07-10 – 2014-07-11 (×7): 3 mL via RESPIRATORY_TRACT
  Filled 2014-07-10 (×8): qty 3

## 2014-07-10 MED ORDER — LISINOPRIL 5 MG PO TABS
5.0000 mg | ORAL_TABLET | Freq: Every day | ORAL | Status: DC
  Administered 2014-07-10: 5 mg via ORAL
  Filled 2014-07-10 (×2): qty 1

## 2014-07-10 MED ORDER — METHYLPREDNISOLONE SODIUM SUCC 125 MG IJ SOLR
60.0000 mg | Freq: Three times a day (TID) | INTRAMUSCULAR | Status: DC
  Administered 2014-07-10 – 2014-07-12 (×5): 60 mg via INTRAVENOUS
  Filled 2014-07-10 (×6): qty 0.96

## 2014-07-10 MED ORDER — ASPIRIN EC 81 MG PO TBEC
81.0000 mg | DELAYED_RELEASE_TABLET | Freq: Every day | ORAL | Status: DC
  Administered 2014-07-10 – 2014-07-16 (×7): 81 mg via ORAL
  Filled 2014-07-10 (×7): qty 1

## 2014-07-10 MED ORDER — LOSARTAN POTASSIUM 25 MG PO TABS
25.0000 mg | ORAL_TABLET | Freq: Every day | ORAL | Status: DC
  Administered 2014-07-10 – 2014-07-12 (×3): 25 mg via ORAL
  Filled 2014-07-10 (×4): qty 1

## 2014-07-10 NOTE — Care Management Note (Signed)
CARE MANAGEMENT NOTE 07/10/2014  Patient:  Steven Beasley, Steven Beasley   Account Number:  [id number]  Date Initiated:  07/09/2014  Documentation initiated by:  Avo Schlachter  Subjective/Objective Assessment:   CM following for progression and d/c planning.     Action/Plan:   Anticipated DC Date:     Anticipated DC Plan:           Choice offered to / List presented to:             Status of service:   Medicare Important Message given?  YES (If response is "NO", the following Medicare IM given date fields will be blank) Date Medicare IM given:  07/09/2014 Medicare IM given by:  Kinley Ferrentino Date Additional Medicare IM given:   Additional Medicare IM given by:    Discharge Disposition:    Per UR Regulation:    If discussed at Long Length of Stay Meetings, dates discussed:    Comments:

## 2014-07-10 NOTE — Progress Notes (Signed)
Family Medicine Teaching Service Daily Progress Note Intern Pager: [phone]  Patient name: Steven Beasley Medical record number: [id number] Date of birth: 05-17-1940 Age: 74 y.o. Gender: male  Primary Care Provider: Alease Frame [name], MD Consultants: Fatima Sanger Code Status: Full   Assessment and Plan: Steven Beasley is a 74 y.o. male presenting with shortness of breath. PMH is significant for HTN, COPD, BPH, GERD, smoldering myeloma.  #COPD exacerbation/ CAP- Pt with increased dyspnea/sputum production/cough.  C/W COPD exacerbation with new O2 requirement - Continue with duonebs, space to PRN - Continue with Levaquin (covering possible CAP) - Acute O2 desaturations at AM 3/21  - Repeat CXR  - STAT ABG, Lactic Acid, EKG  - Consulted Pulm >> appreciate recs >> Solumedrol 60mg  BID  - Echo: EF 30-35%; PA pressure 49mmHg  - CTA: No evidence of PE; RLL/RML consolidation - PT/OT eval   #HFrEF: EF 30-35% - Discontinue home norvasc - Start Lisinopril 5mg  - Consider starting Coreg tomorrow 3/23  #HTN:  - Discontinue home norvasc - Start Lisinopril 5mg  - Consider starting Coreg tomorrow 3/23  #Smoldering Myeloma - Followed by oncology, stable - Monitor CBC   FEN/GI: HH diet, SLIV PPx: SQ heparin   Disposition: Telemetry pending further improvement   Subjective:  SOB improved since yesterday; currently continues to need supplemental O2.   Objective: Temp:  [97 F (36.1 C)-98.6 F (37 C)] 98.2 F (36.8 C) (03/22 0711) Pulse Rate:  [32-103] 96 (03/22 0800) Resp:  [13-23] 18 (03/22 0800) BP: (125-161)/(74-114) 161/105 mmHg (03/22 0800) SpO2:  [91 %-100 %] 100 % (03/22 0810) Physical Exam: General: NAD, diaphoretic Cardiovascular: Irregular irregular, no murmurs appreciated  Respiratory: increased respiratory effort/accessory muscle use, no crackles, expiratory wheezes appreciated in all lung fields. [place] in place. Abdomen: Soft/NT/ND, NABS Extremities: No edema    Laboratory:  Recent Labs Lab 07/07/14 0143 07/07/14 0720 07/10/14 0902  WBC 7.1 5.3 5.1  HGB 11.3* 11.0* 11.6*  HCT 33.4* 32.4* 34.0*  PLT 278 279 320    Recent Labs Lab 07/07/14 0143 07/07/14 0720 07/09/14 1315  NA 133*  --  133*  K 3.8  --  4.4  CL 96  --  94*  CO2 27  --  32  BUN 6  --  10  CREATININE 0.88 0.87 0.80  CALCIUM 9.3  --  8.6  PROT 6.8  --   --   BILITOT 0.7  --   --   ALKPHOS 52  --   --   ALT 72*  --   --   AST 46*  --   --   GLUCOSE 107*  --  95   EKG - Sinus tach, PAC, RBBB  Imaging/Diagnostic Tests: Dg Chest Port 1 View 07/07/2014  IMPRESSION: Right upper lobe infiltrate Electronically Signed By: Andreas Newport M.D. On: 07/07/2014 02:44   CXR 3/21 IMPRESSION: Hyperinflation again noted. Streaky right middle lobe atelectasis or infiltrate. No pulmonary edema.   Elberta Leatherwood, MD 07/10/2014, 9:54 AM PGY-1, [place] Intern pager: [phone], text pages welcome

## 2014-07-10 NOTE — Evaluation (Signed)
Occupational Therapy Evaluation and Discharge Patient Details Name: Steven Beasley MRN: [id number] DOB: 02/25/1941 Today's Date: 07/10/2014    History of Present Illness pt presents with COPD exacerbation and CAP.     Clinical Impression   Pt able to perform ADL and ADL transfers independently. 02 sats remained 93% or above on room air during grooming at sink.  Pt with audible wheezing. Instructed in energy conservation strategies.    Follow Up Recommendations  No OT follow up    Equipment Recommendations  None recommended by OT    Recommendations for Other Services       Precautions / Restrictions Precautions Precautions: None Restrictions Weight Bearing Restrictions: No      Mobility Bed Mobility Overal bed mobility: Independent                Transfers Overall transfer level: Independent Equipment used: None                  Balance                                            ADL Overall ADL's : Modified independent                                       General ADL Comments: Pt fatigued with shaving after standing x 5 minutes, sat to complete task. Instructed pt in energy conservation strategies.     Vision     Perception     Praxis      Pertinent Vitals/Pain Pain Assessment: No/denies pain     Hand Dominance Right   Extremity/Trunk Assessment Upper Extremity Assessment Upper Extremity Assessment: Overall WFL for tasks assessed   Lower Extremity Assessment Lower Extremity Assessment: Overall WFL for tasks assessed   Cervical / Trunk Assessment Cervical / Trunk Assessment: Normal   Communication Communication Communication: No difficulties   Cognition Arousal/Alertness: Awake/alert Behavior During Therapy: WFL for tasks assessed/performed Overall Cognitive Status: No family/caregiver present to determine baseline cognitive functioning Area of Impairment: Orientation Orientation Level:  Time                 General Comments       Exercises       Shoulder Instructions      Home Living Family/patient expects to be discharged to:: Private residence Living Arrangements: Alone Available Help at Discharge: Family;Available PRN/intermittently (nephew) Type of Home: Apartment Home Access: Level entry     Home Layout: One level     Bathroom Shower/Tub: Teacher, early years/pre: Standard     Home Equipment: None          Prior Functioning/Environment Level of Independence: Independent        Comments: does not drive, nephew assist with transportation    OT Diagnosis:     OT Problem List:     OT Treatment/Interventions:      OT Goals(Current goals can be found in the care plan section) Acute Rehab OT Goals Patient Stated Goal: go home tomorrow  OT Frequency:     Barriers to D/C:            Co-evaluation              End of Session Nurse Communication: Other (comment) (ok  to have ginger ale and graham crackers)  Activity Tolerance: Patient limited by fatigue Patient left: in chair;with call bell/phone within reach   Time: 1478-2956 OT Time Calculation (min): 39 min Charges:  OT General Charges $OT Visit: 1 Procedure OT Evaluation $Initial OT Evaluation Tier I: 1 Procedure OT Treatments $Self Care/Home Management : 23-37 mins G-Codes:    Malka So 07/10/2014, 11:55 AM [phone]

## 2014-07-10 NOTE — Consult Note (Signed)
Name: Steven Beasley MRN: [id number] DOB: 1940/11/17    ADMISSION DATE:  07/07/2014 CONSULTATION DATE:  3/21  REFERRING MD :  Lindell Noe (FPTS)  CHIEF COMPLAINT:  Dyspnea, COPD   BRIEF PATIENT DESCRIPTION:   74yo male with hx Gold D COPD, still smoking, hx GERD, smoldering myeloma presented 3/19 with worsening SOB and increased inhaler usage x 2-3 days.  Was admitted by FPTS with AECOPD and RUL CAP.  Treated with IV steroids, IV abx, BD's.  He was improving slowly but became increasingly SOB 3/21 am and PCCM consulted to assist.  C/o increased SOB, congestion/cough, fatigue.  Denies chest pain, purulent sputum, hemoptysis, lightheadedness, recent travel, orthopnea or edema.    SUBJECTIVE:  07/10/14: still wheezing but better. ECHO with ef 30-25%. PASP 46. CTA with RLL and RML consolidation. No PE. ACE inhibitor started by FPTS with plans for coreg tomorrow. BNP up at 439   VITAL SIGNS: Temp:  [97 F (36.1 C)-98.6 F (37 C)] 97.8 F (36.6 C) (03/22 1100) Pulse Rate:  [32-102] 100 (03/22 1400) Resp:  [12-23] 15 (03/22 1400) BP: (125-165)/(58-123) 125/65 mmHg (03/22 1300) SpO2:  [91 %-100 %] 97 % (03/22 1400)  PHYSICAL EXAMINATION: General:  Pleasant, chronically ill appearing male, NAD  Neuro:  Awake, alert, appropriate, talking on the phone, MAE  HEENT:  Mm dry, poor dentition, no JVD  Cardiovascular:  s1s2 rrr Lungs:  resps even, mildly labored with talking or exertion, non labored at rest, prolonged exhalation, diffuse exp wheeze  Abdomen:  Soft, non tender, +bs  Musculoskeletal:  Warm and dry, no edema    PULMONARY  Recent Labs Lab 07/07/14 0450 07/09/14 1223  PHART 7.377 7.342*  PCO2ART 49.9* 59.3*  PO2ART 34.0* 63.3*  HCO3 29.3* 31.3*  TCO2 31 33.1  O2SAT 63.0 90.1    CBC  Recent Labs Lab 07/07/14 0143 07/07/14 0720 07/10/14 0902  HGB 11.3* 11.0* 11.6*  HCT 33.4* 32.4* 34.0*  WBC 7.1 5.3 5.1  PLT 278 279 320    COAGULATION No results for input(s):  INR in the last 168 hours.  CARDIAC  No results for input(s): TROPONINI in the last 168 hours. No results for input(s): PROBNP in the last 168 hours.   CHEMISTRY  Recent Labs Lab 07/07/14 0143 07/07/14 0720 07/09/14 1315 07/10/14 0902  NA 133*  --  133* 131*  K 3.8  --  4.4 4.1  CL 96  --  94* 90*  CO2 27  --  32 33*  GLUCOSE 107*  --  95 108*  BUN 6  --  10 7  CREATININE 0.88 0.87 0.80 0.68  CALCIUM 9.3  --  8.6 8.7   Estimated Creatinine Clearance: 87.4 mL/min (by C-G formula based on Cr of 0.68).   LIVER  Recent Labs Lab 07/07/14 0143  AST 46*  ALT 72*  ALKPHOS 52  BILITOT 0.7  PROT 6.8  ALBUMIN 3.7     INFECTIOUS  Recent Labs Lab 07/09/14 1048 07/09/14 1222  LATICACIDVEN 1.0 1.1     ENDOCRINE CBG (last 3)  No results for input(s): GLUCAP in the last 72 hours.       IMAGING x48h Dg Chest 2 View  07/09/2014   CLINICAL DATA:  Shortness of breath, cough, hypoxia  EXAM: CHEST  2 VIEW  COMPARISON:  07/07/2014  FINDINGS: Cardiomediastinal silhouette is stable. There is streaky right middle lobe atelectasis or early infiltrate. No pulmonary edema. Hyperinflation again noted. Mild degenerative changes lower thoracic spine.  IMPRESSION:  Hyperinflation again noted. Streaky right middle lobe atelectasis or infiltrate. No pulmonary edema.   Electronically Signed   By: Lahoma Crocker M.D.   On: 07/09/2014 10:36   Ct Angio Chest Pe W/cm &/or Wo Cm  07/09/2014   CLINICAL DATA:  Shortness of breath.  EXAM: CT ANGIOGRAPHY CHEST WITH CONTRAST  TECHNIQUE: Multidetector CT imaging of the chest was performed using the standard protocol during bolus administration of intravenous contrast. Multiplanar CT image reconstructions and MIPs were obtained to evaluate the vascular anatomy.  CONTRAST:  182mL OMNIPAQUE IOHEXOL 350 MG/ML SOLN  COMPARISON:  04/18/2013  FINDINGS: THORACIC INLET/BODY WALL:  No acute abnormality.  MEDIASTINUM:  Chronic mild cardiomegaly. No pericardial  effusion. There is limited systemic arterial enhancement; no evidence of aortic dissection. When accounting for motion and scattered streak artifact, no convincing pulmonary embolism. No adenopathy.  LUNG WINDOWS:  Hyperinflation and apical emphysematous change. There is diffuse bronchial wall thickening with scattered mucoid impaction and mild cylindrical bronchiectasis in the right lower lobe. Tracheobronchomalacia noted centrally with collapse of airways. There is a small cluster of ill-defined nodules within the right upper lobe and mild atelectasis in the lateral segment right middle lobe.  UPPER ABDOMEN:  1 cm hyperdense nodule in the upper right kidney is likely a hemorrhagic cyst. There is an additional cyst in the upper right kidney measuring approximately 2 cm.  OSSEOUS:  No acute fracture.  No suspicious lytic or blastic lesions.  Review of the MIP images confirms the above findings.  IMPRESSION: 1. Negative for pulmonary embolism. 2. Bronchitis with scattered mucoid impaction and early pneumonia in the right upper lobe. 3. Emphysema.   Electronically Signed   By: Monte Fantasia M.D.   On: 07/09/2014 17:40       ASSESSMENT / PLAN:  AECOPD  CAP  Tobacco abuse  Hx GERD    - improved subjectively but still wheezing. Has new dx of HFrEF   PLAN -  Address wheezing   - lasix 20mg  IV x 1  - change ACE inhibitor to ARB  - no Coreg  Supplemental O2 as needed to keep sats 88-92% Increase  IV steroids  Cont PO levaquin  PPI  SMOKING CESSATION Hold symbicort for now  Budesonide neb BID PRN albuterol  Oral care  BiPAP if worse  GLOBAL 07/09/14: PCCM Discussed at length with patient.  He has Gold D COPD at baseline.  He would very much like to get better and go back home to his normal level of functioning, caring for himself, going to church etc.  However he would not want to be intubated at any time and has discussed this before with family.  Will make DNI.  Cont full, aggressive  medical care short of intubation/mech ventilation.    Dr. Brand Males, M.D., [facility].C.P Pulmonary and Critical Care Medicine Staff Physician [facility] Pager: [phone], If no answer or between  15:00h - 7:00h: call [phone]  07/10/2014 2:40 PM

## 2014-07-10 NOTE — Progress Notes (Signed)
Family practice MD paged regarding irregular heart rhythm at times. Since rate is controlled, no new orders at this time. Will continue to monitor.  Sandre Kitty

## 2014-07-11 ENCOUNTER — Encounter (HOSPITAL_COMMUNITY): Payer: Self-pay | Admitting: Physician Assistant

## 2014-07-11 DIAGNOSIS — I5022 Chronic systolic (congestive) heart failure: Secondary | ICD-10-CM | POA: Insufficient documentation

## 2014-07-11 DIAGNOSIS — I5021 Acute systolic (congestive) heart failure: Secondary | ICD-10-CM

## 2014-07-11 DIAGNOSIS — J189 Pneumonia, unspecified organism: Secondary | ICD-10-CM

## 2014-07-11 DIAGNOSIS — I420 Dilated cardiomyopathy: Secondary | ICD-10-CM

## 2014-07-11 LAB — MAGNESIUM: MAGNESIUM: 1.5 mg/dL (ref 1.5–2.5)

## 2014-07-11 LAB — PHOSPHORUS: Phosphorus: 2 mg/dL — ABNORMAL LOW (ref 2.3–4.6)

## 2014-07-11 MED ORDER — IPRATROPIUM-ALBUTEROL 0.5-2.5 (3) MG/3ML IN SOLN
3.0000 mL | Freq: Four times a day (QID) | RESPIRATORY_TRACT | Status: DC
  Administered 2014-07-12 – 2014-07-14 (×12): 3 mL via RESPIRATORY_TRACT
  Filled 2014-07-11 (×12): qty 3

## 2014-07-11 MED ORDER — SODIUM CHLORIDE 0.9 % IJ SOLN
3.0000 mL | Freq: Two times a day (BID) | INTRAMUSCULAR | Status: DC
  Administered 2014-07-12: 3 mL via INTRAVENOUS

## 2014-07-11 MED ORDER — SODIUM CHLORIDE 0.9 % IV SOLN
INTRAVENOUS | Status: DC
  Administered 2014-07-12: 04:00:00 via INTRAVENOUS

## 2014-07-11 MED ORDER — SODIUM CHLORIDE 0.9 % IV SOLN: 250.0000 mL | INTRAVENOUS | Status: DC | PRN

## 2014-07-11 MED ORDER — SODIUM CHLORIDE 0.9 % IJ SOLN: 3.0000 mL | INTRAMUSCULAR | Status: DC | PRN

## 2014-07-11 MED ORDER — ASPIRIN 81 MG PO CHEW
81.0000 mg | CHEWABLE_TABLET | ORAL | Status: AC
  Administered 2014-07-12: 81 mg via ORAL
  Filled 2014-07-11: qty 1

## 2014-07-11 NOTE — Progress Notes (Signed)
Family Medicine Teaching Service Daily Progress Note Intern Pager: [phone]  Patient name: Steven Beasley Medical record number: [id number] Date of birth: Oct 07, 1940 Age: 74 y.o. Gender: male  Primary Care Provider: Alease Frame [name], MD Consultants: Steven Beasley Code Status: Full   Assessment and Plan: Steven Beasley is a 74 y.o. male presenting with shortness of breath. PMH is significant for HTN, COPD, BPH, GERD, smoldering myeloma.  #COPD exacerbation/ CAP- Pt presented with increased dyspnea/sputum production/cough.  C/W COPD exacerbation with new O2 requirement. Wheezing. Improving.  - Continue with duonebs, space to PRN - Continue with Levaquin PO (covering possible CAP) - Acute O2 desaturations at AM 3/21  - Consulted Pulm >> appreciate recs >> Solumedrol 60mg  TID IV but will transition to PO.  - Echo: EF 30-35%; PA pressure 45mmHg  - CTA: No evidence of PE; RLL/RML consolidation - PT/OT eval  -supplement oxygen as needed to keep sats above 90% - currently on RA  #HFrEF: EF 30-35%. - Discontinue home norvasc - s/p lasix IV x1 - Continue ARB -Will curbside consult cardiology to set up outpatient follow up for patient and get their recommendations on BB. - strict I/Os  #HTN:  - Discontinue home norvasc - ARB started per pulmonology; did not recommend Coreg  #Smoldering Myeloma - Followed by oncology, stable - Monitor CBC   FEN/GI: HH diet, SLIV PPx: SQ heparin   Disposition: Pending further improvement. Possible home tomorrow.   Subjective:  SOB continuing to improve. Patient still emdorses wheezing. No overnight events. Pt has no concerns.   Objective: Temp:  [97.6 F (36.4 C)-98.2 F (36.8 C)] 98.2 F (36.8 C) (03/23 0458) Pulse Rate:  [91-106] 106 (03/23 0458) Resp:  [11-23] 18 (03/23 0458) BP: (121-165)/(58-123) 134/96 mmHg (03/23 0458) SpO2:  [93 %-100 %] 95 % (03/23 0726) Weight:  [168 lb 12.8 oz (76.567 kg)-169 lb 9.6 oz (76.93 kg)] 168 lb 12.8 oz (76.567  kg) (03/23 0458) Physical Exam: General: NAD, alert, well-appearing Cardiovascular: Irregular irregular, no murmurs appreciated  Respiratory: Comfortable WOB, no crackles, expiratory wheezes appreciated. Good air movement. Abdomen: Soft/NT/ND, NABS Extremities: No edema   Laboratory:  Recent Labs Lab 07/07/14 0143 07/07/14 0720 07/10/14 0902  WBC 7.1 5.3 5.1  HGB 11.3* 11.0* 11.6*  HCT 33.4* 32.4* 34.0*  PLT 278 279 320    Recent Labs Lab 07/07/14 0143 07/07/14 0720 07/09/14 1315 07/10/14 0902  NA 133*  --  133* 131*  K 3.8  --  4.4 4.1  CL 96  --  94* 90*  CO2 27  --  32 33*  BUN 6  --  10 7  CREATININE 0.88 0.87 0.80 0.68  CALCIUM 9.3  --  8.6 8.7  PROT 6.8  --   --   --   BILITOT 0.7  --   --   --   ALKPHOS 52  --   --   --   ALT 72*  --   --   --   AST 46*  --   --   --   GLUCOSE 107*  --  95 108*   EKG - Sinus tach, PAC, RBBB  Imaging/Diagnostic Tests: Dg Chest Port 1 View 07/07/2014  IMPRESSION: Right upper lobe infiltrate Electronically Signed By: Steven Beasley M.D. On: 07/07/2014 02:44   CXR 3/21 IMPRESSION: Hyperinflation again noted. Streaky right middle lobe atelectasis or infiltrate. No pulmonary edema.   Steven Shams, DO 07/11/2014, 7:59 AM PGY-1, [place] Intern pager:  [phone], text pages welcome

## 2014-07-11 NOTE — Consult Note (Signed)
CARDIOLOGY CONSULT NOTE   Patient ID: DONTARIO EVETTS MRN: [id number] DOB/AGE: 73-03-1941 73 y.o.  Admit date: 07/07/2014  Primary Physician   [name], [name], MD Primary Cardiologist   New Reason for Consultation   CHF  PQZ:RAQTMA E Baney is a 73 y.o. year old male with a history of HTN, COPD, BPH, GERD, and smoldering myeloma. He was admitted to the hospital 03/19 with SOB. He was started on ABX for URI and is on nebs and prednisone for COPD flare. He was seen by Dr. Alva Garnet 03/21, who felt he was improving and that the major problems was severe COPD. An echocardiogram was performed 03/21, results are below. His EF is decreased, no old available for comparison. He was started on lisinopril, a BB was planned. His Norvasc was discontinued. CCM signed off today and cardiology was asked to evaluate him.    Mr. Fredderick Severance denies ever having chest pain. He has chronic dyspnea on exertion, but states he can walk one or 2 blocks without stopping. He also states he can climb a flight of stairs without stopping, but admits it been a while since he did this. He uses his nebulizer 4 times a day. He denies any history of orthopnea or PND. He wheezes on a regular basis. He denies lower extremity edema. He denies EtOH abuse, but states he drinks 2 - 40 ounce beers on Saturday and on Sunday. He denies EtOH use during the week.  States he still works part-time, helping to [employer] and is able to help remove pieces of furniture such as stressors and sofas without difficulty.  Past Medical History  Diagnosis Date  . Hypertension   . Leukocytopenia   . Asthma   . Abdominal pain, chronic, generalized   . GERD (gastroesophageal reflux disease)   . BPH (benign prostatic hyperplasia)   . 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 Shortness of breath      Past Surgical History  Procedure Laterality Date  . Video bronchoscopy Bilateral 10/28/2012    Procedure: VIDEO BRONCHOSCOPY WITHOUT FLUORO;  Surgeon:  Rigoberto Noel, MD;  Location: [facility];  Service: Cardiopulmonary;  Laterality: Bilateral;  . Penile prosthesis implant  1997    No Known Allergies  I have reviewed the patient's current medications . aspirin EC  81 mg Oral Daily  . budesonide  0.5 mg Nebulization BID  . gabapentin  300 mg Oral TID  . heparin  5,000 Units Subcutaneous 3 times per day  . ipratropium-albuterol  3 mL Nebulization Q4H  . levofloxacin  500 mg Oral Daily  . losartan  25 mg Oral Daily  . methylPREDNISolone (SOLU-MEDROL) injection  60 mg Intravenous 3 times per day  . pantoprazole  80 mg Oral Q1200  . pentosan polysulfate  100 mg Oral TID AC  . tamsulosin  0.4 mg Oral Daily  . venlafaxine XR  150 mg Oral Daily     albuterol, promethazine  Medication Sig  albuterol (PROVENTIL HFA;VENTOLIN HFA) 108 (90 BASE) MCG/ACT inhaler Inhale 2 puffs into the lungs every 6 (six) hours as needed for wheezing or shortness of breath.  albuterol (PROVENTIL) (2.5 MG/3ML) 0.083% nebulizer solution Take 3 mLs (2.5 mg total) by nebulization every 4 (four) hours as needed for wheezing or shortness of breath (dx J44.9).  amLODipine (NORVASC) 10 MG tablet Take 1 tablet (10 mg total) by mouth daily.  diazepam (VALIUM) 5 MG tablet Take 1 tablet (5 mg total) by mouth 2 (two) times daily.  esomeprazole (NEXIUM) 40 MG capsule Take 40 mg by mouth daily.  esomeprazole (NEXIUM) 40 MG capsule TAKE ONE CAPSULE BY MOUTH EVERY MORNING BEFORE A MEAL  fluticasone (FLONASE) 50 MCG/ACT nasal spray Place 2 sprays into the nose daily.  Fluticasone Furoate-Vilanterol (BREO ELLIPTA) 100-25 MCG/INH AEPB Inhale 1 puff into the lungs daily.  gabapentin (NEURONTIN) 300 MG capsule Take 300 mg by mouth 3 (three) times daily.    ibuprofen (ADVIL,MOTRIN) 800 MG tablet Take 1 tablet (800 mg total) by mouth 3 (three) times daily. Patient taking differently: Take 800 mg by mouth every 8 (eight) hours as needed for moderate pain.   ipratropium (ATROVENT HFA)  17 MCG/ACT inhaler Inhale 2 puffs into the lungs every 4 (four) hours as needed for wheezing.  pantoprazole (PROTONIX) 40 MG tablet Take 1 tablet (40 mg total) by mouth daily.  pentosan polysulfate (ELMIRON) 100 MG capsule Take 100 mg by mouth 3 (three) times daily before meals.   Tamsulosin HCl (FLOMAX) 0.4 MG CAPS Take 0.4 mg by mouth daily.   venlafaxine XR (EFFEXOR-XR) 150 MG 24 hr capsule Take 1 capsule (150 mg total) by mouth daily.   History   Social History  . Marital Status: Single    Spouse Name: N/A  . Number of Children: N/A  . Years of Education: N/A   Occupational History  . Retired    Social History Main Topics  . Smoking status: Former Smoker -- 0.00 packs/day for 0 years    Quit date: 11/08/2009  . Smokeless tobacco: Never Used  . Alcohol Use: 3.6 oz/week    6 Cans of beer per week     Comment: ocasional on weekends  . Drug Use: No  . Sexual Activity: No   Other Topics Concern  . Not on file   Social History Narrative   5 Children    Family Status  Relation Status Death Age  . Father  11    Poisoning  . Mother  66    CVA   Family History  Problem Relation Age of Onset  . Asthma Sister      ROS:  Full 14 point review of systems complete and found to be negative unless listed above.  Physical Exam: Blood pressure 131/85, pulse 97, temperature 98.3 F (36.8 C), temperature source Oral, resp. rate 19, height 6\' 1"  (1.854 m), weight 168 lb 12.8 oz (76.567 kg), SpO2 94 %.  General: Well developed, well nourished, male in no acute distress Head: Eyes PERRLA, No xanthomas.   Normocephalic and atraumatic, oropharynx without edema or exudate. Dentition: Poor Lungs: Rales and a slight expiratory wheeze Heart: HRRR S1 S2, no rub/gallop, no murmur. pulses are 2+ all 4 extrem.   Neck: No carotid bruits. No lymphadenopathy.  JVD to jaw. Abdomen: Bowel sounds present, abdomen soft and non-tender without masses or hernias noted. Msk:  No spine or cva  tenderness. No weakness, no joint deformities or effusions. Extremities: No clubbing or cyanosis. No edema.  Neuro: Alert and oriented X 3. No focal deficits noted. Psych:  Good affect, responds appropriately Skin: No rashes or lesions noted.  Labs: ABG    Component Value Date/Time   PHART 7.342* 07/09/2014 1223   PCO2ART 59.3* 07/09/2014 1223   PO2ART 63.3* 07/09/2014 1223   HCO3 31.3* 07/09/2014 1223   TCO2 33.1 07/09/2014 1223   O2SAT 90.1 07/09/2014 1223   Lab Results  Component Value Date   WBC 5.1 07/10/2014   HGB 11.6* 07/10/2014   HCT  34.0* 07/10/2014   MCV 95.2 07/10/2014   PLT 320 07/10/2014   No results for input(s): INR in the last 72 hours.  Recent Labs Lab 07/07/14 0143  07/10/14 0902  NA 133*  < > 131*  K 3.8  < > 4.1  CL 96  < > 90*  CO2 27  < > 33*  BUN 6  < > 7  CREATININE 0.88  < > 0.68  CALCIUM 9.3  < > 8.7  PROT 6.8  --   --   BILITOT 0.7  --   --   ALKPHOS 52  --   --   ALT 72*  --   --   AST 46*  --   --   GLUCOSE 107*  < > 108*  ALBUMIN 3.7  --   --   < > = values in this interval not displayed. MAGNESIUM  Date Value Ref Range Status  07/11/2014 1.5 1.5 - 2.5 mg/dL Final   B NATRIURETIC PEPTIDE  Date/Time Value Ref Range Status  07/09/2014 10:48 AM 493.8* 0.0 - 100.0 pg/mL Final   Lab Results  Component Value Date   CHOL 118 07/02/2014   HDL 61 07/02/2014   LDLCALC 45 07/02/2014   TRIG 58 07/02/2014   Lab Results  Component Value Date   DDIMER 0.55* 07/09/2014   TSH  Date/Time Value Ref Range Status  07/02/2014 04:30 PM 1.954 0.350 - 4.500 uIU/mL Final   B NATRIURETIC PEPTIDE  Date Value Ref Range Status  07/09/2014 493.8* 0.0 - 100.0 pg/mL Final   Echo: 07/09/2014 Study Conclusions - Left ventricle: The cavity size was mildly dilated. Wall thickness was normal. Systolic function was moderately to severely reduced. The estimated ejection fraction was in the range of 30% to 35%. Possible moderate hypokinesis of  the inferior myocardium. The study is not technically sufficient to allow evaluation of LV diastolic function. - Aortic valve: There was mild regurgitation. - Mitral valve: Calcified annulus. There was mild regurgitation. - Left atrium: The atrium was mildly dilated. - Right atrium: The atrium was mildly dilated. - Pulmonary arteries: PA peak pressure: 46 mm Hg (S).  ECG:  07/09/2014 Sinus rhythm, frequent PACs Vent. rate 98 BPM PR interval 132 ms QRS duration 118 ms QT/QTc 388/495 ms P-R-T axes 79 59 69  Radiology:  Ct Angio Chest Pe W/cm &/or Wo Cm 07/09/2014   CLINICAL DATA:  Shortness of breath.  EXAM: CT ANGIOGRAPHY CHEST WITH CONTRAST  TECHNIQUE: Multidetector CT imaging of the chest was performed using the standard protocol during bolus administration of intravenous contrast. Multiplanar CT image reconstructions and MIPs were obtained to evaluate the vascular anatomy.  CONTRAST:  112mL OMNIPAQUE IOHEXOL 350 MG/ML SOLN  COMPARISON:  04/18/2013  FINDINGS: THORACIC INLET/BODY WALL:  No acute abnormality.  MEDIASTINUM:  Chronic mild cardiomegaly. No pericardial effusion. There is limited systemic arterial enhancement; no evidence of aortic dissection. When accounting for motion and scattered streak artifact, no convincing pulmonary embolism. No adenopathy.  LUNG WINDOWS:  Hyperinflation and apical emphysematous change. There is diffuse bronchial wall thickening with scattered mucoid impaction and mild cylindrical bronchiectasis in the right lower lobe. Tracheobronchomalacia noted centrally with collapse of airways. There is a small cluster of ill-defined nodules within the right upper lobe and mild atelectasis in the lateral segment right middle lobe.  UPPER ABDOMEN:  1 cm hyperdense nodule in the upper right kidney is likely a hemorrhagic cyst. There is an additional cyst in the upper right kidney measuring  approximately 2 cm.  OSSEOUS:  No acute fracture.  No suspicious lytic or blastic  lesions.  Review of the MIP images confirms the above findings.  IMPRESSION: 1. Negative for pulmonary embolism. 2. Bronchitis with scattered mucoid impaction and early pneumonia in the right upper lobe. 3. Emphysema.   Electronically Signed   By: Monte Fantasia M.D.   On: 07/09/2014 17:40    ASSESSMENT AND PLAN:   The patient was seen today by Dr. Angelena Form, the patient evaluated and the data reviewed.   Active Problems:   CAP (community acquired pneumonia) - Per IM - He is currently afebrile and his white count was within normal limits    COPD exacerbation - Per IM, he feels he has improved since admission - On steroids and nebs    Hypoxia - Probably acute on chronic - Blood gas shows a compensated respiratory acidosis    Acute systolic congestive heart failure - No edema or interstitial fluid seen on chest x-ray or CT. - May have PAH from the COPD - has JVD and BNP is elevated, but no fluid on CT chest 03/21 - rec'd 1 dose Lasix 20 mg IV - no hx ischemic symptoms - would help to know if he has CAD, also R heart pressures, consider R/L cath  Signed: Rosaria Ferries, PA-C 07/11/2014 4:44 PM Beeper [phone]  I have personally seen and examined this patient with Rosaria Ferries, PA-C. I agree with the assessment and plan as outlined above. He is admitted with COPD exacerbation/pneumonia. LVEF is found to be reduced at 30-35%. He has had no chest pain. RVSP elevated on echo. He was felt to have some volume overload on admission but seems euvolemic now. I have discussed a right and left heart cath tomorrow to assess pressures and exclude CAD. Labs reviewed and ok for cath. Of note, he also has frequent PACs and short bursts of a narrow complex tachycardia. This could represent MAT. Follow on telemetry.   MCALHANY,CHRISTOPHER 5:37 PM 07/11/2014

## 2014-07-11 NOTE — Progress Notes (Signed)
Name: Steven Beasley MRN: [id number] DOB: 1940/09/20    ADMISSION DATE:  07/07/2014 CONSULTATION DATE:  3/21  REFERRING MD :  Lindell Noe (FPTS)  CHIEF COMPLAINT:  Dyspnea, COPD   BRIEF PATIENT DESCRIPTION:  74 yo male smoker presented with dyspnea from AECOPD and CAP.  Hx of GOLD D COPD followed by Dr. Elsworth Soho.   STUDIES: 11/19/12 PFT >> FEV1 0.74 (25%), FEV1% 38, DLCO 37%, +BD 3/21 CT chest >> centrilobular emphysema, diffuse bronchial wall thickening, mild BTX RLL, tracheobronchomalacia 3/21 Echo >> EF 30 to 35%, PAS 46 mmHg  SUBJECTIVE:  Breathing better.  Still has cough with clear sputum, but less.  C/o runny nose.  Denies chest pain.  VITAL SIGNS: Temp:  [97.6 F (36.4 C)-98.2 F (36.8 C)] 98.2 F (36.8 C) (03/23 0458) Pulse Rate:  [91-119] 119 (03/23 1056) Resp:  [11-18] 18 (03/23 0458) BP: (121-160)/(58-96) 122/91 mmHg (03/23 1056) SpO2:  [93 %-97 %] 95 % (03/23 0726) Weight:  [168 lb 12.8 oz (76.567 kg)-169 lb 9.6 oz (76.93 kg)] 168 lb 12.8 oz (76.567 kg) (03/23 0458)  PHYSICAL EXAMINATION: General:  No distress Neuro: normal strength HEENT: clear nasal discharge Cardiovascular: regular Lungs:  Decreased BS, no wheeze Abdomen:  Soft, non tender Musculoskeletal:  Warm and dry, no edema    PULMONARY  Recent Labs Lab 07/07/14 0450 07/09/14 1223  PHART 7.377 7.342*  PCO2ART 49.9* 59.3*  PO2ART 34.0* 63.3*  HCO3 29.3* 31.3*  TCO2 31 33.1  O2SAT 63.0 90.1    CBC  Recent Labs Lab 07/07/14 0143 07/07/14 0720 07/10/14 0902  HGB 11.3* 11.0* 11.6*  HCT 33.4* 32.4* 34.0*  WBC 7.1 5.3 5.1  PLT 278 279 320   CHEMISTRY  Recent Labs Lab 07/07/14 0143 07/07/14 0720 07/09/14 1315 07/10/14 0902 07/11/14 0350  NA 133*  --  133* 131*  --   K 3.8  --  4.4 4.1  --   CL 96  --  94* 90*  --   CO2 27  --  32 33*  --   GLUCOSE 107*  --  95 108*  --   BUN 6  --  10 7  --   CREATININE 0.88 0.87 0.80 0.68  --   CALCIUM 9.3  --  8.6 8.7  --   MG  --   --   --    --  1.5  PHOS  --   --   --   --  2.0*   Estimated Creatinine Clearance: 89.1 mL/min (by C-G formula based on Cr of 0.68).  LIVER  Recent Labs Lab 07/07/14 0143  AST 46*  ALT 72*  ALKPHOS 52  BILITOT 0.7  PROT 6.8  ALBUMIN 3.7   INFECTIOUS  Recent Labs Lab 07/09/14 1048 07/09/14 1222  LATICACIDVEN 1.0 1.1   IMAGING x48h Ct Angio Chest Pe W/cm &/or Wo Cm  07/09/2014   CLINICAL DATA:  Shortness of breath.  EXAM: CT ANGIOGRAPHY CHEST WITH CONTRAST  TECHNIQUE: Multidetector CT imaging of the chest was performed using the standard protocol during bolus administration of intravenous contrast. Multiplanar CT image reconstructions and MIPs were obtained to evaluate the vascular anatomy.  CONTRAST:  15mL OMNIPAQUE IOHEXOL 350 MG/ML SOLN  COMPARISON:  04/18/2013  FINDINGS: THORACIC INLET/BODY WALL:  No acute abnormality.  MEDIASTINUM:  Chronic mild cardiomegaly. No pericardial effusion. There is limited systemic arterial enhancement; no evidence of aortic dissection. When accounting for motion and scattered streak artifact, no convincing pulmonary embolism. No adenopathy.  LUNG WINDOWS:  Hyperinflation and apical emphysematous change. There is diffuse bronchial wall thickening with scattered mucoid impaction and mild cylindrical bronchiectasis in the right lower lobe. Tracheobronchomalacia noted centrally with collapse of airways. There is a small cluster of ill-defined nodules within the right upper lobe and mild atelectasis in the lateral segment right middle lobe.  UPPER ABDOMEN:  1 cm hyperdense nodule in the upper right kidney is likely a hemorrhagic cyst. There is an additional cyst in the upper right kidney measuring approximately 2 cm.  OSSEOUS:  No acute fracture.  No suspicious lytic or blastic lesions.  Review of the MIP images confirms the above findings.  IMPRESSION: 1. Negative for pulmonary embolism. 2. Bronchitis with scattered mucoid impaction and early pneumonia in the right  upper lobe. 3. Emphysema.   Electronically Signed   By: Monte Fantasia M.D.   On: 07/09/2014 17:40       ASSESSMENT / PLAN:  Acute COPD exacerbation. Regional bronchiectasis. CAP. Hx of GOLD D COPD. Tobacco abuse. Plan: - day 6 of Abx per primary team >> currently on levaquin - continue pulmicort, duoneb - wean off solumedrol as tolerated - smoking cessation - f/u CXR as needed  Chronic systolic heart failure. Plan: - per primary team  Goals of care >> DNI  PCCM will sign off.  Please call if additional help is needed while he is in hospital.  He has appointment scheduled with Dr. Elsworth Soho for May 3 in pulmonary office.  Chesley Mires, MD [facility] Pulmonary/Critical Care 07/11/2014, 11:38 AM Pager:  [phone] After 3pm call: [phone]

## 2014-07-12 ENCOUNTER — Encounter (HOSPITAL_COMMUNITY)
Admission: EM | Disposition: A | Discharge status: Home / Self Care | Payer: Self-pay | Source: Home / Self Care | Attending: Family Medicine

## 2014-07-12 DIAGNOSIS — I509 Heart failure, unspecified: Secondary | ICD-10-CM

## 2014-07-12 DIAGNOSIS — I429 Cardiomyopathy, unspecified: Secondary | ICD-10-CM

## 2014-07-12 HISTORY — PX: LEFT AND RIGHT HEART CATHETERIZATION WITH CORONARY ANGIOGRAM: SHX5449

## 2014-07-12 LAB — POCT I-STAT 3, VENOUS BLOOD GAS (G3P V)
ACID-BASE EXCESS: 5 mmol/L — AB (ref 0.0–2.0)
Bicarbonate: 31.4 mEq/L — ABNORMAL HIGH (ref 20.0–24.0)
O2 Saturation: 58 %
PH VEN: 7.379 — AB (ref 7.250–7.300)
TCO2: 33 mmol/L (ref 0–100)
pCO2, Ven: 53.2 mmHg — ABNORMAL HIGH (ref 45.0–50.0)
pO2, Ven: 32 mmHg (ref 30.0–45.0)

## 2014-07-12 LAB — BASIC METABOLIC PANEL
Anion gap: 8 (ref 5–15)
BUN: 10 mg/dL (ref 6–23)
CALCIUM: 8.9 mg/dL (ref 8.4–10.5)
CO2: 32 mmol/L (ref 19–32)
Chloride: 88 mmol/L — ABNORMAL LOW (ref 96–112)
Creatinine, Ser: 0.74 mg/dL (ref 0.50–1.35)
GFR calc Af Amer: >90 mL/min (ref >90)
GFR calc non Af Amer: 89 mL/min — ABNORMAL LOW (ref >90)
GLUCOSE: 114 mg/dL — AB (ref 70–99)
Potassium: 4 mmol/L (ref 3.5–5.1)
Sodium: 128 mmol/L — ABNORMAL LOW (ref 135–145)

## 2014-07-12 LAB — POCT I-STAT 3, ART BLOOD GAS (G3+)
ACID-BASE EXCESS: 2 mmol/L (ref 0.0–2.0)
Bicarbonate: 27.6 mEq/L — ABNORMAL HIGH (ref 20.0–24.0)
O2 Saturation: 89 %
PH ART: 7.396 (ref 7.350–7.450)
TCO2: 29 mmol/L (ref 0–100)
pCO2 arterial: 45.1 mmHg — ABNORMAL HIGH (ref 35.0–45.0)
pO2, Arterial: 58 mmHg — ABNORMAL LOW (ref 80.0–100.0)

## 2014-07-12 LAB — PHOSPHORUS: Phosphorus: 2.4 mg/dL (ref 2.3–4.6)

## 2014-07-12 LAB — PROTIME-INR
INR: 1.1 (ref 0.00–1.49)
Prothrombin Time: 14.3 seconds (ref 11.6–15.2)

## 2014-07-12 LAB — MAGNESIUM: Magnesium: 1.6 mg/dL (ref 1.5–2.5)

## 2014-07-12 LAB — POCT ACTIVATED CLOTTING TIME: ACTIVATED CLOTTING TIME: 159 s

## 2014-07-12 SURGERY — LEFT AND RIGHT HEART CATHETERIZATION WITH CORONARY ANGIOGRAM
Anesthesia: LOCAL

## 2014-07-12 MED ORDER — LIDOCAINE HCL (PF) 1 % IJ SOLN
INTRAMUSCULAR | Status: AC
  Filled 2014-07-12: qty 30

## 2014-07-12 MED ORDER — HEPARIN (PORCINE) IN NACL 2-0.9 UNIT/ML-% IJ SOLN
INTRAMUSCULAR | Status: AC
Start: 2014-07-12 — End: 2014-07-12
  Filled 2014-07-12: qty 1500

## 2014-07-12 MED ORDER — MIDAZOLAM HCL 2 MG/2ML IJ SOLN
INTRAMUSCULAR | Status: AC
  Filled 2014-07-12: qty 2

## 2014-07-12 MED ORDER — FENTANYL CITRATE 0.05 MG/ML IJ SOLN
INTRAMUSCULAR | Status: AC
  Filled 2014-07-12: qty 2

## 2014-07-12 MED ORDER — VERAPAMIL HCL 2.5 MG/ML IV SOLN
INTRAVENOUS | Status: AC
  Filled 2014-07-12: qty 2

## 2014-07-12 MED ORDER — PREDNISONE 50 MG PO TABS
50.0000 mg | ORAL_TABLET | Freq: Every day | ORAL | Status: AC
  Administered 2014-07-12 – 2014-07-16 (×5): 50 mg via ORAL
  Filled 2014-07-12 (×5): qty 1

## 2014-07-12 MED ORDER — NITROGLYCERIN 1 MG/10 ML FOR IR/CATH LAB
INTRA_ARTERIAL | Status: AC
  Filled 2014-07-12: qty 10

## 2014-07-12 MED ORDER — SODIUM CHLORIDE 0.9 % IV SOLN
INTRAVENOUS | Status: AC
  Administered 2014-07-12: 17:00:00 via INTRAVENOUS

## 2014-07-12 MED ORDER — GI COCKTAIL ~~LOC~~
30.0000 mL | Freq: Once | ORAL | Status: AC
  Administered 2014-07-12: 30 mL via ORAL
  Filled 2014-07-12: qty 30

## 2014-07-12 NOTE — CV Procedure (Signed)
      Cardiac Catheterization Operative Report  Steven Beasley [id number] 3/24/20164:03 PM [name], [name], MD  Procedure Performed:  1. Left Heart Catheterization 2. Selective Coronary Angiography 3. Right Heart Catheterization  Operator: Lauree Chandler, MD  Indication:  74 yo male with cardiomyopathy, CHF. Cardiac cath to exclude CAD and assess pressures.                               Procedure Details: The risks, benefits, complications, treatment options, and expected outcomes were discussed with the patient. The patient and/or family concurred with the proposed plan, giving informed consent. The patient was brought to the cath lab after IV hydration was begun and oral premedication was given. The patient was further sedated with Versed and Fentanyl. The patient had an antecubital IV placed in the right arm by nursing. This area was prepped and draped but I could not pass a wire into the central venous system. The right groin was prepped and draped in the usual manner. Using the modified Seldinger access technique, a 7 French sheath was placed in the right femoral vein. A balloon tipped catheter was used to perform a right heart catheterization. I then prepped the right wrist and used 1% lidocaine for local anesthesia. A 5 French sheath was placed in the right radial artery. 3 mg Verapamil was given through the sheath. 4000 Units IV heparin was given. Standard diagnostic catheters were used to perform selective coronary angiography. The JR4 catheter was used to cross the aortic valve into the LV. Pressures measured. There were no immediate complications. Terumo hemostasis band applied to right wrist. The patient was taken to the recovery area in stable condition.   Hemodynamic Findings: Ao:  113/67              LV: 112/15/21 RA:  6         RV: 38/13/14 PA: 40/9 (mean 21) PCWP:  21 Fick Cardiac Output: 5.19 L/min Fick Cardiac Index: 2.63 L/min/m2 Central Aortic  Saturation: Pulmonary Artery Saturation:  Angiographic Findings:  Left main: No obstructive disease.   Left Anterior Descending Artery: Large caliber vessel that courses to the apex. Moderate caliber diagonal branch. No obstructive disease.   Circumflex Artery: Large caliber vessel with large obtuse marginal branch. No obstructive disease.   Right Coronary Artery: Large dominant vessel with no obstructive disease.   Left Ventricular Angiogram: Deferred.   Impression: 1. No angiographic evidence of CAD 2. Non-ischemic cardiomyopathy 3. Mild elevation wedge pressure  Recommendations: Will not need further ischemic evaluation. Will need daily Lasix. Add beta blocker and titrate ARB as tolerated.        Complications:  None; patient tolerated the procedure well.

## 2014-07-12 NOTE — H&P (View-Only) (Signed)
Patient Name: Steven Beasley Date of Encounter: 07/12/2014  Primary Cardiologist New (Dr. Angelena Form)   Active Problems:   CAP (community acquired pneumonia)   COPD exacerbation   Hypoxia   SOB (shortness of breath)   Acute systolic congestive heart failure    SUBJECTIVE  Denies any CP. Continue to have wheezing, but no SOB  CURRENT MEDS . aspirin EC  81 mg Oral Daily  . budesonide  0.5 mg Nebulization BID  . gabapentin  300 mg Oral TID  . heparin  5,000 Units Subcutaneous 3 times per day  . ipratropium-albuterol  3 mL Nebulization Q6H  . levofloxacin  500 mg Oral Daily  . losartan  25 mg Oral Daily  . methylPREDNISolone (SOLU-MEDROL) injection  60 mg Intravenous 3 times per day  . pantoprazole  80 mg Oral Q1200  . pentosan polysulfate  100 mg Oral TID AC  . sodium chloride  3 mL Intravenous Q12H  . tamsulosin  0.4 mg Oral Daily  . venlafaxine XR  150 mg Oral Daily    OBJECTIVE  Filed Vitals:   07/11/14 2134 07/11/14 2233 07/12/14 0116 07/12/14 0447  BP: 136/88   134/88  Pulse: 99   97  Temp: 96.6 F (35.9 C)   96.1 F (35.6 C)  TempSrc: Oral   Oral  Resp: 18   22  Height:      Weight:  168 lb 12.8 oz (76.567 kg)  166 lb 8 oz (75.524 kg)  SpO2: 97%  97% 100%    Intake/Output Summary (Last 24 hours) at 07/12/14 0810 Last data filed at 07/12/14 0617  Gross per 24 hour  Intake 1209.17 ml  Output    700 ml  Net 509.17 ml   Filed Weights   07/11/14 0458 07/11/14 2233 07/12/14 0447  Weight: 168 lb 12.8 oz (76.567 kg) 168 lb 12.8 oz (76.567 kg) 166 lb 8 oz (75.524 kg)    PHYSICAL EXAM  General: Pleasant, NAD. Neuro: Alert and oriented X 3. Moves all extremities spontaneously. Psych: Normal affect. HEENT:  Normal  Neck: Supple without bruits or JVD. Lungs:  Resp regular and unlabored. Significant wheezing and markedly diminished breath sound throughout the entire lung. Heart: regularly irregular. no s3, s4, or murmurs. Abdomen: Soft, non-tender,  non-distended, BS + x 4.  Extremities: No clubbing, cyanosis or edema. DP/PT/Radials 2+ and equal bilaterally.  Accessory Clinical Findings  CBC  Recent Labs  07/10/14 0902  WBC 5.1  HGB 11.6*  HCT 34.0*  MCV 95.2  PLT 626   Basic Metabolic Panel  Recent Labs  07/09/14 1315 07/10/14 0902 07/11/14 0350 07/12/14 0534  NA 133* 131*  --   --   K 4.4 4.1  --   --   CL 94* 90*  --   --   CO2 32 33*  --   --   GLUCOSE 95 108*  --   --   BUN 10 7  --   --   CREATININE 0.80 0.68  --   --   CALCIUM 8.6 8.7  --   --   MG  --   --  1.5 1.6  PHOS  --   --  2.0* 2.4   D-Dimer  Recent Labs  07/09/14 1315  DDIMER 0.55*    TELE Sinus tach with frequent PACs, HR 100-110s    ECG  No new EKG  Echocardiogram  LV EF: 30% -  35%  ------------------------------------------------------------------- Indications:   Dyspnea 786.09.  -------------------------------------------------------------------  History:  PMH:  Chronic obstructive pulmonary disease.  ------------------------------------------------------------------- Study Conclusions  - Left ventricle: The cavity size was mildly dilated. Wall thickness was normal. Systolic function was moderately to severely reduced. The estimated ejection fraction was in the range of 30% to 35%. Possible moderate hypokinesis of the inferior myocardium. The study is not technically sufficient to allow evaluation of LV diastolic function. - Aortic valve: There was mild regurgitation. - Mitral valve: Calcified annulus. There was mild regurgitation. - Left atrium: The atrium was mildly dilated. - Right atrium: The atrium was mildly dilated. - Pulmonary arteries: PA peak pressure: 46 mm Hg (S).    Radiology/Studies  Dg Chest 2 View  07/09/2014   CLINICAL DATA:  Shortness of breath, cough, hypoxia  EXAM: CHEST  2 VIEW  COMPARISON:  07/07/2014  FINDINGS: Cardiomediastinal silhouette is stable. There is streaky right  middle lobe atelectasis or early infiltrate. No pulmonary edema. Hyperinflation again noted. Mild degenerative changes lower thoracic spine.  IMPRESSION: Hyperinflation again noted. Streaky right middle lobe atelectasis or infiltrate. No pulmonary edema.   Electronically Signed   By: Lahoma Crocker M.D.   On: 07/09/2014 10:36   Ct Angio Chest Pe W/cm &/or Wo Cm  07/09/2014   CLINICAL DATA:  Shortness of breath.  EXAM: CT ANGIOGRAPHY CHEST WITH CONTRAST  TECHNIQUE: Multidetector CT imaging of the chest was performed using the standard protocol during bolus administration of intravenous contrast. Multiplanar CT image reconstructions and MIPs were obtained to evaluate the vascular anatomy.  CONTRAST:  156mL OMNIPAQUE IOHEXOL 350 MG/ML SOLN  COMPARISON:  04/18/2013  FINDINGS: THORACIC INLET/BODY WALL:  No acute abnormality.  MEDIASTINUM:  Chronic mild cardiomegaly. No pericardial effusion. There is limited systemic arterial enhancement; no evidence of aortic dissection. When accounting for motion and scattered streak artifact, no convincing pulmonary embolism. No adenopathy.  LUNG WINDOWS:  Hyperinflation and apical emphysematous change. There is diffuse bronchial wall thickening with scattered mucoid impaction and mild cylindrical bronchiectasis in the right lower lobe. Tracheobronchomalacia noted centrally with collapse of airways. There is a small cluster of ill-defined nodules within the right upper lobe and mild atelectasis in the lateral segment right middle lobe.  UPPER ABDOMEN:  1 cm hyperdense nodule in the upper right kidney is likely a hemorrhagic cyst. There is an additional cyst in the upper right kidney measuring approximately 2 cm.  OSSEOUS:  No acute fracture.  No suspicious lytic or blastic lesions.  Review of the MIP images confirms the above findings.  IMPRESSION: 1. Negative for pulmonary embolism. 2. Bronchitis with scattered mucoid impaction and early pneumonia in the right upper lobe. 3. Emphysema.    Electronically Signed   By: Monte Fantasia M.D.   On: 07/09/2014 17:40   Dg Chest Port 1 View  07/07/2014   CLINICAL DATA:  Productive cough for 2 days.  Dyspnea.  EXAM: PORTABLE CHEST - 1 VIEW  COMPARISON:  01/20/2013  FINDINGS: There is focal airspace opacity in the lateral aspect of the right upper lobe. This may represent infectious infiltrate. Pulmonary vasculature is normal. No large effusion is evident. Hilar and mediastinal contours are unremarkable and unchanged.  IMPRESSION: Right upper lobe infiltrate   Electronically Signed   By: Andreas Newport M.D.   On: 07/07/2014 02:44    ASSESSMENT AND PLAN Patient admitted for hypoxia 2/2 PNA and COPD exacerbation found to have EF 30-35% by Echo 07/09/2014 with possible inferior hypokinesis. No prior echo to compare. Denies any recent CP. Cardiology consulted, plan L&RHC to  further assess.  1. Acute systolic HF with newly diagnosed LV dysfunction  - Echo 07/09/2014 EF 30-35% with possible inferior hypokinesis, mild MR/AR  - pending L&RHC today, risk and benefit explained include bleeding, renal/vascular injury, MI and stroke related complication, patient willing to proceed with procedure.  2. CAP: per IM 3. COPD exacerbation   - CTA of chest 3/21 negative for PE, bronchitis with scattered mucoid impaction and early PNA in RUL, emphysema 4. Hypoxia 5. Elevated d-dimer: see CTA result above  Signed, Woodward Ku Pager: [phone]  I have seen and examined the patient along with Almyra Deforest PA-C.  I have reviewed the chart, notes and new data.  I agree with PA's note.  Key new complaints: feels breathing is back to normal Key examination changes: lying flat in bed , comfortable; scattered wheezing  PLAN: R and L heart cath today. This procedure has been fully reviewed with the patient and written informed consent has been obtained.  Sanda Klein, MD, [place] [phone] 07/12/2014, 10:24 AM

## 2014-07-12 NOTE — Interval H&P Note (Signed)
History and Physical Interval Note:  07/12/2014 3:14 PM  AZARIAS CHIOU  has presented today for cardiac cath with the diagnosis of cardiomypathy, CHF. The various methods of treatment have been discussed with the patient and family. After consideration of risks, benefits and other options for treatment, the patient has consented to  Procedure(s): LEFT AND RIGHT HEART CATHETERIZATION WITH CORONARY ANGIOGRAM (N/A) as a surgical intervention .  The patient's history has been reviewed, patient examined, no change in status, stable for surgery.  I have reviewed the patient's chart and labs.  Questions were answered to the patient's satisfaction.    Cath Lab Visit (complete for each Cath Lab visit)  Clinical Evaluation Leading to the Procedure:   ACS: No.  Non-ACS:    Anginal Classification: CCS II  Anti-ischemic medical therapy: No Therapy  Non-Invasive Test Results: No non-invasive testing performed  Prior CABG: No previous CABG        Shaye Lagace

## 2014-07-12 NOTE — Progress Notes (Signed)
After removing 3/12cc air from TR Band, pt began to bleed from radial site at Level 1.  3cc air replaced in TR Band and Cards PA was notified.

## 2014-07-12 NOTE — Care Management Note (Addendum)
    Page 1 of 1   07/17/2014     3:33:19 PM CARE MANAGEMENT NOTE 07/17/2014  Patient:  Steven Beasley, Steven Beasley   Account Number:  [id number]  Date Initiated:  07/09/2014  Documentation initiated by:  ROYAL,CHERYL  Subjective/Objective Assessment:   CM following for progression and d/c planning.     Action/Plan:   Anticipated DC Date:  07/16/2014   Anticipated DC Plan:  [place]  CM consult      Choice offered to / List presented to:             Status of service:  Completed, signed off Medicare Important Message given?  YES (If response is "NO", the following Medicare IM given date fields will be blank) Date Medicare IM given:  07/09/2014 Medicare IM given by:  ROYAL,CHERYL Date Additional Medicare IM given:  07/16/2014 Additional Medicare IM given by:  Sincere Liuzzi  Discharge Disposition:  HOME/SELF CARE  Per UR Regulation:  Reviewed for med. necessity/level of care/duration of stay  If discussed at Opp of Stay Meetings, dates discussed:    Comments:  07/13/14 Ellan Lambert, RN, BSN [phone] PT/OT recommending no OP follow up at dc.  Will cont to follow progress.   07/12/14 Ellan Lambert, RN, BSN [phone] Pt adm on 07/07/14 with SOB, COPD.  PTA, pt resides at home alone and is independent.  Will follow for dc needs as pt progresses.  Cath today.

## 2014-07-12 NOTE — Progress Notes (Signed)
Patient Name: Steven Beasley Date of Encounter: 07/12/2014  Primary Cardiologist New (Dr. Angelena Form)   Active Problems:   CAP (community acquired pneumonia)   COPD exacerbation   Hypoxia   SOB (shortness of breath)   Acute systolic congestive heart failure    SUBJECTIVE  Denies any CP. Continue to have wheezing, but no SOB  CURRENT MEDS . aspirin EC  81 mg Oral Daily  . budesonide  0.5 mg Nebulization BID  . gabapentin  300 mg Oral TID  . heparin  5,000 Units Subcutaneous 3 times per day  . ipratropium-albuterol  3 mL Nebulization Q6H  . levofloxacin  500 mg Oral Daily  . losartan  25 mg Oral Daily  . methylPREDNISolone (SOLU-MEDROL) injection  60 mg Intravenous 3 times per day  . pantoprazole  80 mg Oral Q1200  . pentosan polysulfate  100 mg Oral TID AC  . sodium chloride  3 mL Intravenous Q12H  . tamsulosin  0.4 mg Oral Daily  . venlafaxine XR  150 mg Oral Daily    OBJECTIVE  Filed Vitals:   07/11/14 2134 07/11/14 2233 07/12/14 0116 07/12/14 0447  BP: 136/88   134/88  Pulse: 99   97  Temp: 96.6 F (35.9 C)   96.1 F (35.6 C)  TempSrc: Oral   Oral  Resp: 18   22  Height:      Weight:  168 lb 12.8 oz (76.567 kg)  166 lb 8 oz (75.524 kg)  SpO2: 97%  97% 100%    Intake/Output Summary (Last 24 hours) at 07/12/14 0810 Last data filed at 07/12/14 0617  Gross per 24 hour  Intake 1209.17 ml  Output    700 ml  Net 509.17 ml   Filed Weights   07/11/14 0458 07/11/14 2233 07/12/14 0447  Weight: 168 lb 12.8 oz (76.567 kg) 168 lb 12.8 oz (76.567 kg) 166 lb 8 oz (75.524 kg)    PHYSICAL EXAM  General: Pleasant, NAD. Neuro: Alert and oriented X 3. Moves all extremities spontaneously. Psych: Normal affect. HEENT:  Normal  Neck: Supple without bruits or JVD. Lungs:  Resp regular and unlabored. Significant wheezing and markedly diminished breath sound throughout the entire lung. Heart: regularly irregular. no s3, s4, or murmurs. Abdomen: Soft, non-tender,  non-distended, BS + x 4.  Extremities: No clubbing, cyanosis or edema. DP/PT/Radials 2+ and equal bilaterally.  Accessory Clinical Findings  CBC  Recent Labs  07/10/14 0902  WBC 5.1  HGB 11.6*  HCT 34.0*  MCV 95.2  PLT 716   Basic Metabolic Panel  Recent Labs  07/09/14 1315 07/10/14 0902 07/11/14 0350 07/12/14 0534  NA 133* 131*  --   --   K 4.4 4.1  --   --   CL 94* 90*  --   --   CO2 32 33*  --   --   GLUCOSE 95 108*  --   --   BUN 10 7  --   --   CREATININE 0.80 0.68  --   --   CALCIUM 8.6 8.7  --   --   MG  --   --  1.5 1.6  PHOS  --   --  2.0* 2.4   D-Dimer  Recent Labs  07/09/14 1315  DDIMER 0.55*    TELE Sinus tach with frequent PACs, HR 100-110s    ECG  No new EKG  Echocardiogram  LV EF: 30% -  35%  ------------------------------------------------------------------- Indications:   Dyspnea 786.09.  -------------------------------------------------------------------  History:  PMH:  Chronic obstructive pulmonary disease.  ------------------------------------------------------------------- Study Conclusions  - Left ventricle: The cavity size was mildly dilated. Wall thickness was normal. Systolic function was moderately to severely reduced. The estimated ejection fraction was in the range of 30% to 35%. Possible moderate hypokinesis of the inferior myocardium. The study is not technically sufficient to allow evaluation of LV diastolic function. - Aortic valve: There was mild regurgitation. - Mitral valve: Calcified annulus. There was mild regurgitation. - Left atrium: The atrium was mildly dilated. - Right atrium: The atrium was mildly dilated. - Pulmonary arteries: PA peak pressure: 46 mm Hg (S).    Radiology/Studies  Dg Chest 2 View  07/09/2014   CLINICAL DATA:  Shortness of breath, cough, hypoxia  EXAM: CHEST  2 VIEW  COMPARISON:  07/07/2014  FINDINGS: Cardiomediastinal silhouette is stable. There is streaky right  middle lobe atelectasis or early infiltrate. No pulmonary edema. Hyperinflation again noted. Mild degenerative changes lower thoracic spine.  IMPRESSION: Hyperinflation again noted. Streaky right middle lobe atelectasis or infiltrate. No pulmonary edema.   Electronically Signed   By: Lahoma Crocker M.D.   On: 07/09/2014 10:36   Ct Angio Chest Pe W/cm &/or Wo Cm  07/09/2014   CLINICAL DATA:  Shortness of breath.  EXAM: CT ANGIOGRAPHY CHEST WITH CONTRAST  TECHNIQUE: Multidetector CT imaging of the chest was performed using the standard protocol during bolus administration of intravenous contrast. Multiplanar CT image reconstructions and MIPs were obtained to evaluate the vascular anatomy.  CONTRAST:  16mL OMNIPAQUE IOHEXOL 350 MG/ML SOLN  COMPARISON:  04/18/2013  FINDINGS: THORACIC INLET/BODY WALL:  No acute abnormality.  MEDIASTINUM:  Chronic mild cardiomegaly. No pericardial effusion. There is limited systemic arterial enhancement; no evidence of aortic dissection. When accounting for motion and scattered streak artifact, no convincing pulmonary embolism. No adenopathy.  LUNG WINDOWS:  Hyperinflation and apical emphysematous change. There is diffuse bronchial wall thickening with scattered mucoid impaction and mild cylindrical bronchiectasis in the right lower lobe. Tracheobronchomalacia noted centrally with collapse of airways. There is a small cluster of ill-defined nodules within the right upper lobe and mild atelectasis in the lateral segment right middle lobe.  UPPER ABDOMEN:  1 cm hyperdense nodule in the upper right kidney is likely a hemorrhagic cyst. There is an additional cyst in the upper right kidney measuring approximately 2 cm.  OSSEOUS:  No acute fracture.  No suspicious lytic or blastic lesions.  Review of the MIP images confirms the above findings.  IMPRESSION: 1. Negative for pulmonary embolism. 2. Bronchitis with scattered mucoid impaction and early pneumonia in the right upper lobe. 3. Emphysema.    Electronically Signed   By: Monte Fantasia M.D.   On: 07/09/2014 17:40   Dg Chest Port 1 View  07/07/2014   CLINICAL DATA:  Productive cough for 2 days.  Dyspnea.  EXAM: PORTABLE CHEST - 1 VIEW  COMPARISON:  01/20/2013  FINDINGS: There is focal airspace opacity in the lateral aspect of the right upper lobe. This may represent infectious infiltrate. Pulmonary vasculature is normal. No large effusion is evident. Hilar and mediastinal contours are unremarkable and unchanged.  IMPRESSION: Right upper lobe infiltrate   Electronically Signed   By: Andreas Newport M.D.   On: 07/07/2014 02:44    ASSESSMENT AND PLAN Patient admitted for hypoxia 2/2 PNA and COPD exacerbation found to have EF 30-35% by Echo 07/09/2014 with possible inferior hypokinesis. No prior echo to compare. Denies any recent CP. Cardiology consulted, plan L&RHC to  further assess.  1. Acute systolic HF with newly diagnosed LV dysfunction  - Echo 07/09/2014 EF 30-35% with possible inferior hypokinesis, mild MR/AR  - pending L&RHC today, risk and benefit explained include bleeding, renal/vascular injury, MI and stroke related complication, patient willing to proceed with procedure.  2. CAP: per IM 3. COPD exacerbation   - CTA of chest 3/21 negative for PE, bronchitis with scattered mucoid impaction and early PNA in RUL, emphysema 4. Hypoxia 5. Elevated d-dimer: see CTA result above  Signed, Woodward Ku Pager: [phone]  I have seen and examined the patient along with Almyra Deforest PA-C.  I have reviewed the chart, notes and new data.  I agree with PA's note.  Key new complaints: feels breathing is back to normal Key examination changes: lying flat in bed , comfortable; scattered wheezing  PLAN: R and L heart cath today. This procedure has been fully reviewed with the patient and written informed consent has been obtained.  Sanda Klein, MD, [place] [phone] 07/12/2014, 10:24 AM

## 2014-07-12 NOTE — Progress Notes (Signed)
Tr band taken off.rt wrist transparent dsg applied. No hematoma nor bleeding noted. Radial pulses +2.color good no cyanosis noted. rt hand warm to touch. Closely monitored for bleeding. Rt arm elevated on pillow.instructed pt not to use rt arm for now.

## 2014-07-12 NOTE — Progress Notes (Signed)
Family Medicine Teaching Service Daily Progress Note Intern Pager: [phone]  Patient name: Steven Beasley Medical record number: [id number] Date of birth: [birth year] Age: 74 y.o. Gender: male  Primary Care Provider: Alease Frame [name], MD Consultants: Fatima Sanger; Cardio Code Status: Full   Assessment and Plan: Steven Beasley is a 74 y.o. male presenting with shortness of breath. PMH is significant for HTN, COPD, BPH, GERD, smoldering myeloma.  #COPD exacerbation/ CAP- Pt presented with increased dyspnea/sputum production/cough.  C/W COPD exacerbation with new O2 requirement. Wheezing. Improving.  - Continue with duonebs, space to PRN - Levaquin PO >> Received dose 3/24, will now DC due to completed 5day course. - Acute O2 desaturations at AM 3/21  - Consulted Pulm >> appreciate recs >> Solumedrol 60mg  TID >> Prednisone 50mg  x5days  - Echo: EF 30-35%; PA pressure 20mmHg  - CTA: No evidence of PE; RLL/RML consolidation - PT/OT eval  -supplement oxygen as needed to keep sats above 90% - currently on RA  #HFrEF: EF 30-35%. 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 Possible home today/tomorrow.   Subjective:  SOB continuing to improve. Patient feeling much better. OK w/ DC later today or tomorrow. No overnight events. Pt has no concerns.   Objective: Temp:  [96.1 F (35.6 C)-98.3 F (36.8 C)] 96.1 F (35.6 C) (03/24 0447) Pulse Rate:  [97-99] 97 (03/24 0859) Resp:  [18-22] 22 (03/24 0447) BP: (122-144)/(70-88) 144/70 mmHg (03/24 0859) SpO2:  [94 %-100 %] 99 % (03/24 0912) Weight:  [166 lb 8 oz (75.524 kg)-168 lb 12.8 oz (76.567 kg)] 166 lb 8  oz (75.524 kg) (03/24 0447) Physical Exam: General: NAD, alert, well-appearing Cardiovascular: Irregular irregular, no murmurs appreciated  Respiratory: Comfortable WOB, no crackles, expiratory wheezes appreciated. Good air movement. Abdomen: Soft/NT/ND, NABS Extremities: No edema   Laboratory:  Recent Labs Lab 07/07/14 0143 07/07/14 0720 07/10/14 0902  WBC 7.1 5.3 5.1  HGB 11.3* 11.0* 11.6*  HCT 33.4* 32.4* 34.0*  PLT 278 279 320    Recent Labs Lab 07/07/14 0143  07/09/14 1315 07/10/14 0902 07/12/14 0534  NA 133*  --  133* 131* 128*  K 3.8  --  4.4 4.1 4.0  CL 96  --  94* 90* 88*  CO2 27  --  32 33* 32  BUN 6  --  10 7 10   CREATININE 0.88  < > 0.80 0.68 0.74  CALCIUM 9.3  --  8.6 8.7 8.9  PROT 6.8  --   --   --   --   BILITOT 0.7  --   --   --   --   ALKPHOS 52  --   --   --   --   ALT 72*  --   --   --   --   AST 46*  --   --   --   --   GLUCOSE 107*  --  95 108* 114*  < > = values in this interval not displayed. EKG - Sinus tach, PAC, RBBB  Imaging/Diagnostic Tests: Dg Chest Port 1 View 07/07/2014  IMPRESSION: Right upper lobe infiltrate Electronically Signed By:  Andreas Newport M.D. On: 07/07/2014 02:44   CXR 3/21 IMPRESSION: Hyperinflation again noted. Streaky right middle lobe atelectasis or infiltrate. No pulmonary edema.   Elberta Leatherwood, MD 07/12/2014, 11:35 AM PGY-1, [place] Intern pager: [phone], text pages welcome

## 2014-07-13 ENCOUNTER — Encounter (HOSPITAL_COMMUNITY): Payer: Self-pay | Admitting: Cardiovascular Disease

## 2014-07-13 DIAGNOSIS — I429 Cardiomyopathy, unspecified: Secondary | ICD-10-CM

## 2014-07-13 DIAGNOSIS — R918 Other nonspecific abnormal finding of lung field: Secondary | ICD-10-CM

## 2014-07-13 LAB — PHOSPHORUS: PHOSPHORUS: 2.5 mg/dL (ref 2.3–4.6)

## 2014-07-13 LAB — BASIC METABOLIC PANEL
Anion gap: 6 (ref 5–15)
BUN: 14 mg/dL (ref 6–23)
CO2: 32 mmol/L (ref 19–32)
Calcium: 9.1 mg/dL (ref 8.4–10.5)
Chloride: 88 mmol/L — ABNORMAL LOW (ref 96–112)
Creatinine, Ser: 0.73 mg/dL (ref 0.50–1.35)
GFR calc Af Amer: >90 mL/min (ref >90)
GFR, EST NON AFRICAN AMERICAN: 90 mL/min — AB (ref >90)
Glucose, Bld: 85 mg/dL (ref 70–99)
POTASSIUM: 4 mmol/L (ref 3.5–5.1)
Sodium: 126 mmol/L — ABNORMAL LOW (ref 135–145)

## 2014-07-13 LAB — CULTURE, BLOOD (ROUTINE X 2)
Culture: NO GROWTH
Culture: NO GROWTH

## 2014-07-13 LAB — MAGNESIUM: Magnesium: 1.6 mg/dL (ref 1.5–2.5)

## 2014-07-13 MED ORDER — FUROSEMIDE 20 MG PO TABS
20.0000 mg | ORAL_TABLET | Freq: Every day | ORAL | Status: DC
  Filled 2014-07-13: qty 1

## 2014-07-13 MED ORDER — LOSARTAN POTASSIUM 50 MG PO TABS
100.0000 mg | ORAL_TABLET | Freq: Every day | ORAL | Status: DC
  Administered 2014-07-13 – 2014-07-16 (×4): 100 mg via ORAL
  Filled 2014-07-13 (×4): qty 2

## 2014-07-13 MED ORDER — ASPIRIN 81 MG PO TBEC
81.0000 mg | DELAYED_RELEASE_TABLET | Freq: Every day | ORAL | Status: AC
Start: 2014-07-13 — End: ?

## 2014-07-13 MED ORDER — FUROSEMIDE 40 MG PO TABS
40.0000 mg | ORAL_TABLET | Freq: Every day | ORAL | Status: DC
  Administered 2014-07-13 – 2014-07-15 (×3): 40 mg via ORAL
  Filled 2014-07-13 (×4): qty 1

## 2014-07-13 NOTE — Discharge Summary (Signed)
[facility] Discharge Summary  Patient name: Steven Beasley Medical record number: [id number] Date of birth: 1940/10/28 Age: 74 y.o. Gender: male Date of Admission: 07/07/2014  Date of Discharge: 07/16/2014  Admitting Physician: Willeen Niece, MD  Primary Care Provider: Elberta Leatherwood, MD Consultants: cards, pulm  Indication for Hospitalization: Shortness of breath  Discharge Diagnoses/Problem List:  COPD exacerbation Community-acquired pneumonia Heart failure with reduced ejection fraction Hyponatremia Hypertension Smoldering myeloma  Disposition: Home  Discharge Condition: Stable  Discharge Exam:  General: NAD, alert, well-appearing HEENT: MMM, sclera clear, EOMI Cardiovascular: RRR, distant, no murmurs appreciated  Respiratory: normal WOB, no crackles; faint wheezing and poor air movement throughout. Abdomen: Soft/NT/ND, NABS Extremities: No edema or calf tenderness bilaterally; dorsalis pedis and posterior tibialis pulses present bilaterally  Brief Hospital Course:  Patient is a 74 year old male who presented with shortness of breath. He stated that for the past cold days he been having increased shortness of breath, he had been using his albuterol inhaler to 3 times a day without much relief. He endorsed rhinorrhea, productive cough, wheezing, and congestion. Denied any fevers chills chest pain orthopnea nausea or vomiting. In the ED patient was provided albuterol, Atrovent, and Solu-Medrol. Patient was placed on 2 L O2, which brought saturations up to 95%. Saturations were approximately 85% without O2. Chest x-ray obtained on admission showed right upper lobe pneumonia. Patient was started on Rocephin and azithromycin for coverage of community-acquired pneumonia.  After admission patient was provided DuoNeb nebs when necessary. He was given prednisone 50 mg burst and antibiotics were switched to oral Levaquin. On 3/21 patient had worsening  respiratory status with increased shortness of breath, O2 requirement, and wheezing. Concern for PE prompted a d-dimer which came back at 0.55. CTA was obtained and showed no evidence of PE, BNP 493.8, ABG showed mild respiratory acidosis. Lactic acid was normal. Pulmonology was consulted they began patient on Solu-Medrol 125 mg twice a day. Respiratory status gradually improved over the course of the following 24 hours. Elevated BNP prompted a echocardiogram to be obtained. This showed a decreased EF of 30-35%. This heart failure with reduced EF was a new diagnosis for this patient. Cardiology was consulted. And performed a cardiac catheterization on 07/12/14. Catheterization showed no angiographic evidence of CAD, nonischemic cardiomyopathy, and mild elevation of patient's wedge pressure.  Patient was started on lisinopril and Coreg. Lisinopril was eventually changed to an ARB. Lasix was provided due to patient showing signs of some fluid overload. Patient gradually developed hyponatremia down to 125. Fluid restriction showed some mild improvement. Patient was ultimately deemed medically stable for discharge with close follow-up and recheck electrolytes within the next 7-10 days.  Issues for Follow Up:  1. Hyponatremia - follow-up with BMP. Minimal workup at this time. Slight improvement prior to discharge, may have been secondary to administration of Lasix during admission. 2. Newly diagnosed heart failure with reduced EF - ejection fraction 30-35%. Discontinued Norvasc, continued ARB, consider starting BiDil if blood pressure can tolerate. Will need reevaluation of left ventricular ejection fraction in 90 days. Borderline indication for ICD placement. 3. COPD exacerbation - patient had not been placed on long-acting controller medication prior to admission. Patient was discharged with prescription for Spiriva HandiHaler. Assessment of patient's compliance and use of this medication likely warranted.  Reassessment of patient's albuterol requirements.  Significant Procedures: Cardiac catheterization 07/12/14  Significant Labs and Imaging:   Recent Labs Lab 07/07/14 0143 07/07/14 0720 07/10/14 0902  WBC 7.1 5.3  5.1  HGB 11.3* 11.0* 11.6*  HCT 33.4* 32.4* 34.0*  PLT 278 279 320    Recent Labs Lab 07/07/14 0143 07/07/14 0720 07/09/14 1315 07/10/14 0902 07/11/14 0350 07/12/14 0534 07/13/14 0551  NA 133*  --  133* 131*  --  128* 126*  K 3.8  --  4.4 4.1  --  4.0 4.0  CL 96  --  94* 90*  --  88* 88*  CO2 27  --  32 33*  --  32 32  GLUCOSE 107*  --  95 108*  --  114* 85  BUN 6  --  10 7  --  10 14  CREATININE 0.88 0.87 0.80 0.68  --  0.74 0.73  CALCIUM 9.3  --  8.6 8.7  --  8.9 9.1  MG  --   --   --   --  1.5 1.6 1.6  PHOS  --   --   --   --  2.0* 2.4 2.5  ALKPHOS 52  --   --   --   --   --   --   AST 46*  --   --   --   --   --   --   ALT 72*  --   --   --   --   --   --   ALBUMIN 3.7  --   --   --   --   --   --     Results/Tests Pending at Time of Discharge: None  Discharge Medications:    Medication List    ASK your doctor about these medications        albuterol (2.5 MG/3ML) 0.083% nebulizer solution  Commonly known as:  PROVENTIL  Take 3 mLs (2.5 mg total) by nebulization every 4 (four) hours as needed for wheezing or shortness of breath (dx J44.9).     albuterol 108 (90 BASE) MCG/ACT inhaler  Commonly known as:  PROVENTIL HFA;VENTOLIN HFA  Inhale 2 puffs into the lungs every 6 (six) hours as needed for wheezing or shortness of breath.     amLODipine 10 MG tablet  Commonly known as:  NORVASC  Take 1 tablet (10 mg total) by mouth daily.     diazepam 5 MG tablet  Commonly known as:  VALIUM  Take 1 tablet (5 mg total) by mouth 2 (two) times daily.     esomeprazole 40 MG capsule  Commonly known as:  NEXIUM  Take 40 mg by mouth daily.     esomeprazole 40 MG capsule  Commonly known as:  NEXIUM  TAKE ONE CAPSULE BY MOUTH EVERY MORNING BEFORE A MEAL      fluticasone 50 MCG/ACT nasal spray  Commonly known as:  FLONASE  Place 2 sprays into the nose daily.     Fluticasone Furoate-Vilanterol 100-25 MCG/INH Aepb  Commonly known as:  BREO ELLIPTA  Inhale 1 puff into the lungs daily.     gabapentin 300 MG capsule  Commonly known as:  NEURONTIN  Take 300 mg by mouth 3 (three) times daily.     ibuprofen 800 MG tablet  Commonly known as:  ADVIL,MOTRIN  Take 1 tablet (800 mg total) by mouth 3 (three) times daily.     ipratropium 17 MCG/ACT inhaler  Commonly known as:  ATROVENT HFA  Inhale 2 puffs into the lungs every 4 (four) hours as needed for wheezing.     pantoprazole 40 MG tablet  Commonly known as:  PROTONIX  Take 1 tablet (40 mg total) by mouth daily.     pentosan polysulfate 100 MG capsule  Commonly known as:  ELMIRON  Take 100 mg by mouth 3 (three) times daily before meals.     tamsulosin 0.4 MG Caps capsule  Commonly known as:  FLOMAX  Take 0.4 mg by mouth daily.     venlafaxine XR 150 MG 24 hr capsule  Commonly known as:  EFFEXOR-XR  Take 1 capsule (150 mg total) by mouth daily.        Discharge Instructions: Please refer to Patient Instructions section of EMR for full details.  Patient was counseled important signs and symptoms that should prompt return to medical care, changes in medications, dietary instructions, activity restrictions, and follow up appointments.   Follow-Up Appointments:     Follow-up Information    Follow up with Rigoberto Noel., MD On 08/21/2014.   Specialty:  Pulmonary Disease   Contact information:   [address] [address] [phone]       Elberta Leatherwood, MD 07/13/2014, 9:56 AM PGY-1, [place]

## 2014-07-13 NOTE — Progress Notes (Addendum)
Patient Name: Steven Beasley Date of Encounter: 07/13/2014  Primary Cardiologist New (Dr. Angelena Form)   Active Problems:   CAP (community acquired pneumonia)   COPD exacerbation   Hypoxia   SOB (shortness of breath)   Acute systolic congestive heart failure    SUBJECTIVE  Denies any CP. Mumbles this morning. Per nursing staff, significant wheezing this morning.   CURRENT MEDS . aspirin EC  81 mg Oral Daily  . budesonide  0.5 mg Nebulization BID  . gabapentin  300 mg Oral TID  . ipratropium-albuterol  3 mL Nebulization Q6H  . losartan  25 mg Oral Daily  . pantoprazole  80 mg Oral Q1200  . pentosan polysulfate  100 mg Oral TID AC  . predniSONE  50 mg Oral Q breakfast  . tamsulosin  0.4 mg Oral Daily  . venlafaxine XR  150 mg Oral Daily    OBJECTIVE  Filed Vitals:   07/12/14 1830 07/12/14 1845 07/12/14 1918 07/13/14 0614  BP: 126/91 128/76 132/81 156/98  Pulse: 74 89 97 67  Temp:    97.7 F (36.5 C)  TempSrc:    Oral  Resp:    20  Height:      Weight:    167 lb 11.2 oz (76.068 kg)  SpO2:    97%    Intake/Output Summary (Last 24 hours) at 07/13/14 0741 Last data filed at 07/13/14 0600  Gross per 24 hour  Intake 1032.5 ml  Output   1075 ml  Net  -42.5 ml   Filed Weights   07/11/14 2233 07/12/14 0447 07/13/14 0614  Weight: 168 lb 12.8 oz (76.567 kg) 166 lb 8 oz (75.524 kg) 167 lb 11.2 oz (76.068 kg)    PHYSICAL EXAM  General: Pleasant, NAD. Neuro: Alert and oriented X 3. Moves all extremities spontaneously. Psych: Normal affect. HEENT:  Normal  Neck: Supple without bruits +JVD with deep inspiration Lungs:  Resp regular and unlabored. Diffuse wheezing this morning, without obvious rale. Heart: RRR no s3, s4, or murmurs. Abdomen: Soft, non-tender, non-distended, BS + x 4.  Extremities: No clubbing, cyanosis or edema. DP/PT/Radials 2+ and equal bilaterally.  Accessory Clinical Findings  CBC  Recent Labs  07/10/14 0902  WBC 5.1  HGB 11.6*  HCT  34.0*  MCV 95.2  PLT 096   Basic Metabolic Panel  Recent Labs  07/12/14 0534 07/13/14 0551  NA 128* 126*  K 4.0 4.0  CL 88* 88*  CO2 32 32  GLUCOSE 114* 85  BUN 10 14  CREATININE 0.74 0.73  CALCIUM 8.9 9.1  MG 1.6 1.6  PHOS 2.4 2.5    TELE NSR with frequent burst of PACs, HR persistent in 90s, occasionally in 100s    ECG  No new EKG  Echocardiogram 07/09/2014  LV EF: 30% -  35%  ------------------------------------------------------------------- Indications:   Dyspnea 786.09.  ------------------------------------------------------------------- History:  PMH:  Chronic obstructive pulmonary disease.  ------------------------------------------------------------------- Study Conclusions  - Left ventricle: The cavity size was mildly dilated. Wall thickness was normal. Systolic function was moderately to severely reduced. The estimated ejection fraction was in the range of 30% to 35%. Possible moderate hypokinesis of the inferior myocardium. The study is not technically sufficient to allow evaluation of LV diastolic function. - Aortic valve: There was mild regurgitation. - Mitral valve: Calcified annulus. There was mild regurgitation. - Left atrium: The atrium was mildly dilated. - Right atrium: The atrium was mildly dilated. - Pulmonary arteries: PA peak pressure: 46 mm Hg (S).  Radiology/Studies  Dg Chest 2 View  07/09/2014   CLINICAL DATA:  Shortness of breath, cough, hypoxia  EXAM: CHEST  2 VIEW  COMPARISON:  07/07/2014  FINDINGS: Cardiomediastinal silhouette is stable. There is streaky right middle lobe atelectasis or early infiltrate. No pulmonary edema. Hyperinflation again noted. Mild degenerative changes lower thoracic spine.  IMPRESSION: Hyperinflation again noted. Streaky right middle lobe atelectasis or infiltrate. No pulmonary edema.   Electronically Signed   By: Lahoma Crocker M.D.   On: 07/09/2014 10:36   Ct Angio Chest Pe W/cm &/or  Wo Cm  07/09/2014   CLINICAL DATA:  Shortness of breath.  EXAM: CT ANGIOGRAPHY CHEST WITH CONTRAST  TECHNIQUE: Multidetector CT imaging of the chest was performed using the standard protocol during bolus administration of intravenous contrast. Multiplanar CT image reconstructions and MIPs were obtained to evaluate the vascular anatomy.  CONTRAST:  136mL OMNIPAQUE IOHEXOL 350 MG/ML SOLN  COMPARISON:  04/18/2013  FINDINGS: THORACIC INLET/BODY WALL:  No acute abnormality.  MEDIASTINUM:  Chronic mild cardiomegaly. No pericardial effusion. There is limited systemic arterial enhancement; no evidence of aortic dissection. When accounting for motion and scattered streak artifact, no convincing pulmonary embolism. No adenopathy.  LUNG WINDOWS:  Hyperinflation and apical emphysematous change. There is diffuse bronchial wall thickening with scattered mucoid impaction and mild cylindrical bronchiectasis in the right lower lobe. Tracheobronchomalacia noted centrally with collapse of airways. There is a small cluster of ill-defined nodules within the right upper lobe and mild atelectasis in the lateral segment right middle lobe.  UPPER ABDOMEN:  1 cm hyperdense nodule in the upper right kidney is likely a hemorrhagic cyst. There is an additional cyst in the upper right kidney measuring approximately 2 cm.  OSSEOUS:  No acute fracture.  No suspicious lytic or blastic lesions.  Review of the MIP images confirms the above findings.  IMPRESSION: 1. Negative for pulmonary embolism. 2. Bronchitis with scattered mucoid impaction and early pneumonia in the right upper lobe. 3. Emphysema.   Electronically Signed   By: Monte Fantasia M.D.   On: 07/09/2014 17:40   Dg Chest Port 1 View  07/07/2014   CLINICAL DATA:  Productive cough for 2 days.  Dyspnea.  EXAM: PORTABLE CHEST - 1 VIEW  COMPARISON:  01/20/2013  FINDINGS: There is focal airspace opacity in the lateral aspect of the right upper lobe. This may represent infectious  infiltrate. Pulmonary vasculature is normal. No large effusion is evident. Hilar and mediastinal contours are unremarkable and unchanged.  IMPRESSION: Right upper lobe infiltrate   Electronically Signed   By: Andreas Newport M.D.   On: 07/07/2014 02:44    ASSESSMENT AND PLAN  Patient admitted for hypoxia 2/2 PNA and COPD exacerbation found to have EF 30-35% by Echo 07/09/2014 with possible inferior hypokinesis. No prior echo to compare. Denies any recent CP. Cardiology consulted, plan L&RHC to further assess.  1. Acute systolic HF with newly diagnosed LV dysfunction - Echo 07/09/2014 EF 30-35% with possible inferior hypokinesis, mild MR/AR  - L&RHC 07/12/2014 wedge pressure 21, CI 2.63, CO 5.19, clean CAD, nonischemic cardiomyopathy  - continue losartan, will avoid BB (selective or nonselective) given how much wheezing this patient has. May consider addition of Imdur/nitrate combo.  - Breathing seems worse this morning. Although wedge pressure is high, no obvious sign of heart failure, lung exam not accurate given degree of wheezing, JVD distention seen with inspiration, add 20mg  daily PO lasix more maintenance.   2. CAP: per IM 3. COPD exacerbation -  CTA of chest 3/21 negative for PE, bronchitis with scattered mucoid impaction and early PNA in RUL, emphysema 4. Hypoxia 5. Elevated d-dimer: see CTA result above   Signed, Woodward Ku Pager: [phone]  I have seen and examined the patient along with Almyra Deforest PA-C.  I have reviewed the chart, notes and new data.  I agree with PA's note.  PLAN: Still hypervolemic. PA HTN can be explained entirely by left heart failure Increase losartan and diuretics. High dose steroids may impede diuresis to some degree. Agree he should not receive beta blockers, at least not now. Hydralazine nitrates once losartan at 100 mg daily, if BP still allows.  Reevaluate LVEF in 90 days. Borderline indication for ICD at this  point. 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 CRT unlikely to help. Hopefully will see improvement in EF with medical Rx.  Sanda Klein, MD, [place] [phone] 07/13/2014, 8:32 AM

## 2014-07-13 NOTE — Progress Notes (Signed)
Family Medicine Teaching Service Daily Progress Note Intern Pager: [phone]  Patient name: Steven Beasley Medical record number: [id number] Date of birth: 1940-08-28 Age: 74 y.o. Gender: male  Primary Care Provider: Alease Frame [name], MD Consultants: Fatima Sanger; Cardio Code Status: Full   Assessment and Plan: [name] is a 74 y.o. male presenting with shortness of breath. PMH is significant for HTN, COPD, BPH, GERD, smoldering myeloma.  #COPD exacerbation/ CAP- Pt presented with increased dyspnea/sputum production/cough.  C/W COPD exacerbation with new O2 requirement. Wheezing. Improving.  - Continue with duonebs, space to PRN - Levaquin PO >> Received dose 3/24, will now DC due to completed 5day course. - Acute O2 desaturations at AM 3/21  - Consulted Pulm >> appreciate recs >> Solumedrol 60mg  TID >> Prednisone 50mg  x5days (final day to be 3/28)  - Echo: EF 30-35%; PA pressure 65mmHg  - CTA: No evidence of PE; RLL/RML consolidation - PT/OT eval  -supplement oxygen as needed to keep sats above 90% - currently on RA  #HFrEF: EF 30-35%. - Discontinue home norvasc - Continue ARB - Cardiology on board >> appreciate recs >> R&L Cath 3/24 >> no blockages or indications to stent  - consider starting Bidil if BP can tolerate - started Lasix 40mg  PO QD - strict I/Os  #HTN:  - Discontinue home norvasc - ARB started per pulmonology; did not recommend Coreg  #Smoldering Myeloma - Followed by oncology, stable - Monitor CBC   FEN/GI: HH diet, SLIV PPx: SQ heparin   Disposition: Pending further improvement. Possible home today/tomorrow.   Subjective:  Increased wheezes today. Patient looked relatively uncomfortable in bed although reported no issues. I informed him that he may have to stay overnight again due to his wheezing.   Objective: Temp:  [97.7 F (36.5 C)-98 F (36.7 C)] 97.7 F (36.5 C) (03/25 0614) Pulse Rate:  [67-105] 67 (03/25 0614) Resp:  [20] 20 (03/25 0614) BP:  (122-156)/(57-98) 156/98 mmHg (03/25 0614) SpO2:  [96 %-97 %] 97 % (03/25 0614) Weight:  [167 lb 11.2 oz (76.068 kg)] 167 lb 11.2 oz (76.068 kg) (03/25 0277) Physical Exam: General: NAD, alert, well-appearing Cardiovascular: Irregular irregular, no murmurs appreciated  Respiratory: normal WOB, no crackles, wheezes appreciated (greater than previous). Good air movement. Abdomen: Soft/NT/ND, NABS Extremities: No edema   Laboratory:  Recent Labs Lab 07/07/14 0143 07/07/14 0720 07/10/14 0902  WBC 7.1 5.3 5.1  HGB 11.3* 11.0* 11.6*  HCT 33.4* 32.4* 34.0*  PLT 278 279 320    Recent Labs Lab 07/07/14 0143  07/10/14 0902 07/12/14 0534 07/13/14 0551  NA 133*  < > 131* 128* 126*  K 3.8  < > 4.1 4.0 4.0  CL 96  < > 90* 88* 88*  CO2 27  < > 33* 32 32  BUN 6  < > 7 10 14   CREATININE 0.88  < > 0.68 0.74 0.73  CALCIUM 9.3  < > 8.7 8.9 9.1  PROT 6.8  --   --   --   --   BILITOT 0.7  --   --   --   --   ALKPHOS 52  --   --   --   --   ALT 72*  --   --   --   --   AST 46*  --   --   --   --   GLUCOSE 107*  < > 108* 114* 85  < > = values in this interval not displayed. EKG -  Sinus tach, PAC, RBBB  Imaging/Diagnostic Tests: Dg Chest Port 1 View 07/07/2014  IMPRESSION: Right upper lobe infiltrate Electronically Signed By: Andreas Newport M.D. On: 07/07/2014 02:44   CXR 3/21 IMPRESSION: Hyperinflation again noted. Streaky right middle lobe atelectasis or infiltrate. No pulmonary edema.   Elberta Leatherwood, MD 07/13/2014, 9:55 AM PGY-1, [place] Intern pager: [phone], text pages welcome

## 2014-07-14 LAB — BASIC METABOLIC PANEL
Anion gap: 9 (ref 5–15)
BUN: 16 mg/dL (ref 6–23)
CHLORIDE: 84 mmol/L — AB (ref 96–112)
CO2: 33 mmol/L — ABNORMAL HIGH (ref 19–32)
CREATININE: 0.93 mg/dL (ref 0.50–1.35)
Calcium: 8.6 mg/dL (ref 8.4–10.5)
GFR calc Af Amer: >90 mL/min (ref >90)
GFR, EST NON AFRICAN AMERICAN: 81 mL/min — AB (ref >90)
Glucose, Bld: 117 mg/dL — ABNORMAL HIGH (ref 70–99)
POTASSIUM: 4.2 mmol/L (ref 3.5–5.1)
Sodium: 126 mmol/L — ABNORMAL LOW (ref 135–145)

## 2014-07-14 LAB — MAGNESIUM: MAGNESIUM: 1.6 mg/dL (ref 1.5–2.5)

## 2014-07-14 LAB — PHOSPHORUS: PHOSPHORUS: 3 mg/dL (ref 2.3–4.6)

## 2014-07-14 MED ORDER — IPRATROPIUM-ALBUTEROL 0.5-2.5 (3) MG/3ML IN SOLN
3.0000 mL | Freq: Three times a day (TID) | RESPIRATORY_TRACT | Status: DC
  Administered 2014-07-15 – 2014-07-16 (×5): 3 mL via RESPIRATORY_TRACT
  Filled 2014-07-14 (×6): qty 3

## 2014-07-14 MED ORDER — TIOTROPIUM BROMIDE MONOHYDRATE 18 MCG IN CAPS: 18.0000 ug | ORAL_CAPSULE | Freq: Once | RESPIRATORY_TRACT | Status: DC

## 2014-07-14 MED ORDER — LOSARTAN POTASSIUM 100 MG PO TABS: 100.0000 mg | ORAL_TABLET | Freq: Every day | ORAL | Status: DC

## 2014-07-14 NOTE — Progress Notes (Signed)
Patient Name: Steven Beasley Date of Encounter: 07/14/2014  Active Problems:   CAP (community acquired pneumonia)   COPD exacerbation   Hypoxia   SOB (shortness of breath)   Acute systolic congestive heart failure   Length of Stay: 7  SUBJECTIVE  Substantial reduction in wheezing and dyspnea. Only modest additional diuresis. No edema, slight JVD.  CURRENT MEDS . aspirin EC  81 mg Oral Daily  . budesonide  0.5 mg Nebulization BID  . furosemide  40 mg Oral Daily  . gabapentin  300 mg Oral TID  . ipratropium-albuterol  3 mL Nebulization Q6H  . losartan  100 mg Oral Daily  . pantoprazole  80 mg Oral Q1200  . pentosan polysulfate  100 mg Oral TID AC  . predniSONE  50 mg Oral Q breakfast  . tamsulosin  0.4 mg Oral Daily  . venlafaxine XR  150 mg Oral Daily    OBJECTIVE   Intake/Output Summary (Last 24 hours) at 07/14/14 1318 Last data filed at 07/14/14 1200  Gross per 24 hour  Intake    480 ml  Output   1225 ml  Net   -745 ml   Filed Weights   07/12/14 0447 07/13/14 0614 07/14/14 0609  Weight: 166 lb 8 oz (75.524 kg) 167 lb 11.2 oz (76.068 kg) 168 lb 1.6 oz (76.25 kg)    PHYSICAL EXAM Filed Vitals:   07/13/14 2142 07/14/14 0138 07/14/14 0609 07/14/14 0834  BP: 121/77  115/81   Pulse: 93  86   Temp: 98 F (36.7 C)  97.7 F (36.5 C)   TempSrc: Oral  Oral   Resp: 18  18   Height:      Weight:   168 lb 1.6 oz (76.25 kg)   SpO2: 99% 97% 100% 97%   General: Alert, oriented x3, no distress Head: no evidence of trauma, PERRL, EOMI, no exophtalmos or lid lag, no myxedema, no xanthelasma; normal ears, nose and oropharynx Neck: normal jugular venous pulsations and no hepatojugular reflux; brisk carotid pulses without delay and no carotid bruits Chest: diminished breath sounds, no wheezes, no signs of consolidation by percussion or palpation, normal fremitus, symmetrical and full respiratory excursions Cardiovascular: normal position and quality of the apical  impulse, regular rhythm, normal first and second heart sounds, no rubs or gallops, no murmur Abdomen: no tenderness or distention, no masses by palpation, no abnormal pulsatility or arterial bruits, normal bowel sounds, no hepatosplenomegaly Extremities: no clubbing, cyanosis or edema; 2+ radial, ulnar and brachial pulses bilaterally; 2+ right femoral, posterior tibial and dorsalis pedis pulses; 2+ left femoral, posterior tibial and dorsalis pedis pulses; no subclavian or femoral bruits Neurological: grossly nonfocal  LABS  CBC No results for input(s): WBC, NEUTROABS, HGB, HCT, MCV, PLT in the last 72 hours. Basic Metabolic Panel  Recent Labs  07/12/14 0534 07/13/14 0551 07/14/14 0338  NA 128* 126*  --   K 4.0 4.0  --   CL 88* 88*  --   CO2 32 32  --   GLUCOSE 114* 85  --   BUN 10 14  --   CREATININE 0.74 0.73  --   CALCIUM 8.9 9.1  --   MG 1.6 1.6 1.6  PHOS 2.4 2.5 3.0   Radiology Studies Imaging results have been reviewed and No results found.    ASSESSMENT AND PLAN  Seems to be approaching/at euvolemic status. Markedly hyponatremic (no labs today), normal renal labs. Fluid restriction is indicated. Tolerating full dose ARB. Would  add Bidil as an outpatient if BP allows. I don't think he can take carvedilol and I am even worried about a more selective beta blocker. Would definitely not start a beta blocker now. Reevaluate LVEF in 90 days. Borderline indication for ICD at this point. 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 CRT unlikely to help. Hopefully will see improvement in EF with medical Rx.  Sanda Klein, MD, [facility] [phone] office [phone] pager 07/14/2014 1:18 PM

## 2014-07-14 NOTE — Progress Notes (Signed)
Family Medicine Teaching Service Daily Progress Note Intern Pager: [phone]  Patient name: Steven Beasley Medical record number: [id number] Date of birth: 11-09-1940 Age: 74 y.o. Gender: male  Primary Care Provider: Alease Frame [name], MD Consultants: Fatima Sanger; Cardio Code Status: Full   Assessment and Plan: Steven Beasley is a 74 y.o. male presenting with shortness of breath. PMH is significant for HTN, COPD, BPH, GERD, smoldering myeloma.  #COPD exacerbation/ CAP- Pt presented with increased dyspnea/sputum production/cough.  C/W COPD exacerbation with new O2 requirement. Wheezing. Improving.  - Continue with duonebs, space to PRN - Levaquin PO >> Received dose 3/24, will now DC due to completed 5day course. - Acute O2 desaturations at AM 3/21  - Consulted Pulm >> appreciate recs >> Solumedrol 60mg  TID >> Prednisone 50mg  x5days (final day to be 3/28)  - Echo: EF 30-35%; PA pressure 52mmHg  - CTA: No evidence of PE; RLL/RML consolidation - PT/OT eval  -supplement oxygen as needed to keep sats above 90% - currently on RA  #HFrEF: EF 30-35%. - Discontinue home norvasc - Continue ARB - Cardiology on board >> appreciate recs >> R&L Cath 3/24 >> no blockages or indications to stent  - consider starting Bidil if BP can tolerate - started Lasix 40mg  PO QD - strict I/Os  #HTN:  - Discontinue home norvasc - ARB started per pulmonology; did not recommend Coreg  #Smoldering Myeloma - Followed by oncology, stable - Monitor CBC   FEN/GI: HH diet, SLIV PPx: SQ heparin   Disposition:  Possible home today/tomorrow.   Subjective:  Breathing much improved today. Will likely DC today if greenlight given from Cardiology  Objective: Temp:  [97.7 F (36.5 C)-98 F (36.7 C)] 97.7 F (36.5 C) (03/26 0609) Pulse Rate:  [58-93] 86 (03/26 0609) Resp:  [18] 18 (03/26 0609) BP: (115-142)/(77-93) 115/81 mmHg (03/26 0609) SpO2:  [97 %-100 %] 97 % (03/26 0834) Weight:  [168 lb 1.6 oz (76.25 kg)]  168 lb 1.6 oz (76.25 kg) (03/26 8413) Physical Exam: General: NAD, alert, well-appearing Cardiovascular: Irregular irregular, no murmurs appreciated  Respiratory: normal WOB, no crackles, wheezes appreciated (improved). Good air movement. Abdomen: Soft/NT/ND, NABS Extremities: No edema   Laboratory:  Recent Labs Lab 07/10/14 0902  WBC 5.1  HGB 11.6*  HCT 34.0*  PLT 320    Recent Labs Lab 07/10/14 0902 07/12/14 0534 07/13/14 0551  NA 131* 128* 126*  K 4.1 4.0 4.0  CL 90* 88* 88*  CO2 33* 32 32  BUN 7 10 14   CREATININE 0.68 0.74 0.73  CALCIUM 8.7 8.9 9.1  GLUCOSE 108* 114* 85   EKG - Sinus tach, PAC, RBBB  Imaging/Diagnostic Tests: Dg Chest Port 1 View 07/07/2014  IMPRESSION: Right upper lobe infiltrate Electronically Signed By: Andreas Newport M.D. On: 07/07/2014 02:44   CXR 3/21 IMPRESSION: Hyperinflation again noted. Streaky right middle lobe atelectasis or infiltrate. No pulmonary edema.   Elberta Leatherwood, MD 07/14/2014, 10:27 AM PGY-1, [place] Intern pager: [phone], text pages welcome

## 2014-07-14 NOTE — Progress Notes (Signed)
Patient respiratory assessment done and patient showed improvement . NO x ray in last five days but patient on room air saturations good but has a slight continuous exp wheeze that doesn't seem better pre or post treatment. Changing to TID at this time and will reassess per protocol and PRN for any changes.

## 2014-07-15 DIAGNOSIS — E871 Hypo-osmolality and hyponatremia: Secondary | ICD-10-CM | POA: Insufficient documentation

## 2014-07-15 LAB — BASIC METABOLIC PANEL
ANION GAP: 7 (ref 5–15)
BUN: 15 mg/dL (ref 6–23)
CO2: 33 mmol/L — AB (ref 19–32)
Calcium: 8.7 mg/dL (ref 8.4–10.5)
Chloride: 85 mmol/L — ABNORMAL LOW (ref 96–112)
Creatinine, Ser: 0.9 mg/dL (ref 0.50–1.35)
GFR calc Af Amer: >90 mL/min (ref >90)
GFR calc non Af Amer: 82 mL/min — ABNORMAL LOW (ref >90)
GLUCOSE: 110 mg/dL — AB (ref 70–99)
Potassium: 3.5 mmol/L (ref 3.5–5.1)
Sodium: 125 mmol/L — ABNORMAL LOW (ref 135–145)

## 2014-07-15 MED ORDER — BUDESONIDE 0.5 MG/2ML IN SUSP
0.5000 mg | Freq: Two times a day (BID) | RESPIRATORY_TRACT | Status: DC
  Administered 2014-07-15 – 2014-07-16 (×2): 0.5 mg via RESPIRATORY_TRACT
  Filled 2014-07-15 (×5): qty 2

## 2014-07-15 NOTE — Progress Notes (Signed)
Family Medicine Teaching Service Daily Progress Note Intern Pager: [phone]  Patient name: Steven Beasley Medical record number: [id number] Date of birth: Feb 28, 1941 Age: 74 y.o. Gender: male  Primary Care Provider: Alease Frame [name], MD Consultants: Fatima Sanger; Cardio Code Status: Full   Assessment and Plan: Steven Beasley is a 74 y.o. male presenting with shortness of breath. PMH is significant for HTN, COPD, BPH, GERD, smoldering myeloma.  #COPD exacerbation/ CAP- Pt presented with increased dyspnea/sputum production/cough.  C/W COPD exacerbation with new O2 requirement. Wheezing. Continues improving. - Continue with duonebs, space to PRN - Levaquin PO >> Received dose 3/24, d/c'ed 3/26 due to completed 5day course. - Acute O2 desaturations at AM 3/21  - Consulted Pulm >> Solumedrol 60mg  TID >> Prednisone 50mg  x5days (final day to be 3/28)  - Echo: EF 30-35%; PA pressure 68mmHg  - CTA: No evidence of PE; RLL/RML consolidation - PT/OT eval  -supplement oxygen as needed to keep sats above 90% - currently on RA  #HFrEF: EF 30-35%. Net neg 1.3L, per cardiology approaching euvolemia. Minimal sx per pt. Weight 3/27 171 lbs. - Discontinue home norvasc - Continue ARB - Cardiology on board >> appreciate recs >> R&L Cath 3/24 >> no blockages or indications to stent  - consider starting Bidil if BP can tolerate  - Re-eval LVEF 90 days. Will need cardiology f/u. Borderline indication for ICD currently. Broad QRS, only 162ms, and closer to RBBB morphology. Thinks CRT unlikely to help and hoping for improved EF with medical Rx. - started Lasix 40mg  PO QD 3/26 - strict I/Os  #Hyponatremia - Marked at 125 today, worsening. Cr normal. 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 Mild LFT elevation last check 3/19. - Fluid restriction 1200cc/day - Recheck CMET in AM  #HTN:  - Discontinue home norvasc - ARB started per pulmonology, tolerating full dose - Cards recommends no beta blocker currently  due to COPD and adding Bidil outpatient if BP allows.  #Smoldering Myeloma - Followed by oncology, stable - Monitor CBC   FEN/GI: HH diet, SLIV, fluid restriction 1200cc/day PPx: SQ heparin   Disposition:  Possible home tomorrow pending improvement in hyponatremia.   Subjective:  Breathing continues to be improved. Denies fever, chest pain, dyspnea, pain, swelling in legs, orthopnea, or PND. Is walking around room and reports normal PO and urination.  Objective: Temp:  [97.6 F (36.4 C)-97.9 F (36.6 C)] 97.6 F (36.4 C) (03/27 0618) Pulse Rate:  [85-108] 85 (03/27 0618) Resp:  [18] 18 (03/27 0618) BP: (107-123)/(58-93) 123/93 mmHg (03/27 0618) SpO2:  [94 %-100 %] 96 % (03/27 0732) Weight:  [171 lb 1.6 oz (77.61 kg)] 171 lb 1.6 oz (77.61 kg) (03/27 0618) Physical Exam: General: NAD, alert, well-appearing Cardiovascular: Regular rhythm, mild tachycardia to 100s, distant, no murmurs appreciated  Respiratory: normal WOB, no crackles; faint wheezing and poor air movement throughout. Abdomen: Soft/NT/ND, NABS Extremities: No edema or calf tenderness bilaterally HEENT: MMM, sclera clear, EOMI  Laboratory:  Recent Labs Lab 07/10/14 0902  WBC 5.1  HGB 11.6*  HCT 34.0*  PLT 320    Recent Labs Lab 07/13/14 0551 07/14/14 1238 07/15/14 0411  NA 126* 126* 125*  K 4.0 4.2 3.5  CL 88* 84* 85*  CO2 32 33* 33*  BUN 14 16 15   CREATININE 0.73 0.93 0.90  CALCIUM 9.1 8.6 8.7  GLUCOSE 85 117* 110*   EKG - Sinus tach, PAC, RBBB  Imaging/Diagnostic Tests: Dg Chest Port 1 View 07/07/2014  IMPRESSION: Right  upper lobe infiltrate Electronically Signed By: Andreas Newport M.D. On: 07/07/2014 02:44   CXR 3/21 IMPRESSION: Hyperinflation again noted. Streaky right middle lobe atelectasis or infiltrate. No pulmonary edema.   Hilton Sinclair, MD 07/15/2014, 10:50 AM PGY-3, [place] Intern pager: [phone], text pages welcome

## 2014-07-15 NOTE — Progress Notes (Signed)
Patient Name: Steven Beasley Date of Encounter: 07/15/2014     Active Problems:   CAP (community acquired pneumonia)   COPD exacerbation   Hypoxia   SOB (shortness of breath)   Acute systolic congestive heart failure    SUBJECTIVE  Breathing mcuh better. No SOB, orhopnea, PND. No CP. Feeling much better.   CURRENT MEDS . aspirin EC  81 mg Oral Daily  . budesonide  0.5 mg Nebulization BID  . furosemide  40 mg Oral Daily  . gabapentin  300 mg Oral TID  . ipratropium-albuterol  3 mL Nebulization TID  . losartan  100 mg Oral Daily  . pantoprazole  80 mg Oral Q1200  . pentosan polysulfate  100 mg Oral TID AC  . predniSONE  50 mg Oral Q breakfast  . tamsulosin  0.4 mg Oral Daily  . venlafaxine XR  150 mg Oral Daily    OBJECTIVE  Filed Vitals:   07/14/14 1505 07/14/14 2118 07/15/14 0618 07/15/14 0732  BP:  112/58 123/93   Pulse:  93 85   Temp:  97.9 F (36.6 C) 97.6 F (36.4 C)   TempSrc:  Oral Oral   Resp:  18 18   Height:      Weight:   171 lb 1.6 oz (77.61 kg)   SpO2: 97% 98% 100% 96%    Intake/Output Summary (Last 24 hours) at 07/15/14 1007 Last data filed at 07/15/14 0851  Gross per 24 hour  Intake   1100 ml  Output    750 ml  Net    350 ml   Filed Weights   07/13/14 0614 07/14/14 0609 07/15/14 0618  Weight: 167 lb 11.2 oz (76.068 kg) 168 lb 1.6 oz (76.25 kg) 171 lb 1.6 oz (77.61 kg)    PHYSICAL EXAM  General: Alert, oriented x3, no distress Head: no evidence of trauma, PERRL, EOMI, no exophtalmos or lid lag, no myxedema, no xanthelasma; normal ears, nose and oropharynx Neck: normal jugular venous pulsations and no hepatojugular reflux; brisk carotid pulses without delay and no carotid bruits Chest: diminished breath sounds, diffuse expiratory wheezes Cardiovascular: normal position and quality of the apical impulse, regular rhythm, normal first and second heart sounds, no rubs or gallops, no murmur Abdomen: no tenderness or distention, no masses  by palpation, no abnormal pulsatility or arterial bruits, normal bowel sounds, no hepatosplenomegaly Extremities: no clubbing, cyanosis or edema; 2+ radial, ulnar and brachial pulses bilaterally; 2+ right femoral, posterior tibial and dorsalis pedis pulses; 2+ left femoral, posterior tibial and dorsalis pedis pulses; no subclavian or femoral bruits Neurological: grossly nonfocal  Accessory Clinical Findings  Basic Metabolic Panel  Recent Labs  07/13/14 0551 07/14/14 0338 07/14/14 1238 07/15/14 0411  NA 126*  --  126* 125*  K 4.0  --  4.2 3.5  CL 88*  --  84* 85*  CO2 32  --  33* 33*  GLUCOSE 85  --  117* 110*  BUN 14  --  16 15  CREATININE 0.73  --  0.93 0.90  CALCIUM 9.1  --  8.6 8.7  MG 1.6 1.6  --   --   PHOS 2.5 3.0  --   --     TELE  Sinus with freq PACs. Some sinus tachy  Radiology/Studies  Dg Chest 2 View  07/09/2014   CLINICAL DATA:  Shortness of breath, cough, hypoxia  EXAM: CHEST  2 VIEW  COMPARISON:  07/07/2014  FINDINGS: Cardiomediastinal silhouette is stable. There is streaky  right middle lobe atelectasis or early infiltrate. No pulmonary edema. Hyperinflation again noted. Mild degenerative changes lower thoracic spine.  IMPRESSION: Hyperinflation again noted. Streaky right middle lobe atelectasis or infiltrate. No pulmonary edema.   Electronically Signed   By: Lahoma Crocker M.D.   On: 07/09/2014 10:36   Ct Angio Chest Pe W/cm &/or Wo Cm  07/09/2014   CLINICAL DATA:  Shortness of breath.  EXAM: CT ANGIOGRAPHY CHEST WITH CONTRAST  TECHNIQUE: Multidetector CT imaging of the chest was performed using the standard protocol during bolus administration of intravenous contrast. Multiplanar CT image reconstructions and MIPs were obtained to evaluate the vascular anatomy.  CONTRAST:  160mL OMNIPAQUE IOHEXOL 350 MG/ML SOLN  COMPARISON:  04/18/2013  FINDINGS: THORACIC INLET/BODY WALL:  No acute abnormality.  MEDIASTINUM:  Chronic mild cardiomegaly. No pericardial effusion. There is  limited systemic arterial enhancement; no evidence of aortic dissection. When accounting for motion and scattered streak artifact, no convincing pulmonary embolism. No adenopathy.  LUNG WINDOWS:  Hyperinflation and apical emphysematous change. There is diffuse bronchial wall thickening with scattered mucoid impaction and mild cylindrical bronchiectasis in the right lower lobe. Tracheobronchomalacia noted centrally with collapse of airways. There is a small cluster of ill-defined nodules within the right upper lobe and mild atelectasis in the lateral segment right middle lobe.  UPPER ABDOMEN:  1 cm hyperdense nodule in the upper right kidney is likely a hemorrhagic cyst. There is an additional cyst in the upper right kidney measuring approximately 2 cm.  OSSEOUS:  No acute fracture.  No suspicious lytic or blastic lesions.  Review of the MIP images confirms the above findings.  IMPRESSION: 1. Negative for pulmonary embolism. 2. Bronchitis with scattered mucoid impaction and early pneumonia in the right upper lobe. 3. Emphysema.   Electronically Signed   By: Monte Fantasia M.D.   On: 07/09/2014 17:40   Dg Chest Port 1 View  07/07/2014   CLINICAL DATA:  Productive cough for 2 days.  Dyspnea.  EXAM: PORTABLE CHEST - 1 VIEW  COMPARISON:  01/20/2013  FINDINGS: There is focal airspace opacity in the lateral aspect of the right upper lobe. This may represent infectious infiltrate. Pulmonary vasculature is normal. No large effusion is evident. Hilar and mediastinal contours are unremarkable and unchanged.  IMPRESSION: Right upper lobe infiltrate   Electronically Signed   By: Andreas Newport M.D.   On: 07/07/2014 02:44    ASSESSMENT AND PLAN  Patient admitted for hypoxia 2/2 PNA and COPD exacerbation found to have EF 30-35% by Echo 07/09/2014 with possible inferior hypokinesis.  Acute systolic HF with newly diagnosed LV dysfunction/ Nonischemic CM -- Echo 07/09/2014 EF 30-35% with possible inferior hypokinesis,  mild MR/AR -- L&RHC on 07/12/14 which revealed no CAD and mildly elevated wedge pressure.  -- Seems to be approaching/at euvolemic status. Net neg 1.3 L. -- Tolerating full dose ARB. Would add Bidil as an outpatient if BP allows. Dr. Sallyanne Kuster felt he could not take carvedilol or a more selective beta blocker. Would definitely not start a beta blocker now. ( due to COPD) -- Reevaluate LVEF in 90 days. Borderline indication for ICD at this point. 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 CRT unlikely to help. Hopefully will see improvement in EF with medical Rx.  Hyponatremia- Markedly hyponatremic: 125 today. normal renal labs. -- Fluid restriction is indicated.  CAP: per IM  COPD exacerbation - CTA of chest 3/21 negative for PE, bronchitis with scattered mucoid impaction and early  PNA in RUL, emphysema  Elevated d-dimer: CTA neg for PE  HTN:  - ARB started per pulmonology; Would add biDil as an outpatient  Smoldering Myeloma - Followed by oncology, stable - Monitor CBC   Signed, Eileen Stanford PA-C  Pager [phone]  I have seen and examined the patient along with Crista Luria, PA NP.  I have reviewed the chart, notes and new data.  I agree with PA's note.  Key new complaints: lying fully supine without dyspnea Key examination changes: slightly positive fluid balance  Key new findings / data: normal renal function, worsening hyponatremia  PLAN: I don't think hyponatremia is CHF related- he appears clinically euvolemic and invasive evaluation showed only moderately elevated filling pressures. Fluid restriction. DC furosemide. Difficult to evaluate for cause of hyponatremia until furosemide effect resolves, but wonder about SIADH. Avoid beta blockers due to COPD.  Sanda Klein, MD, [place] [phone] 07/15/2014, 10:41 AM

## 2014-07-15 NOTE — Discharge Instructions (Signed)
You were admitted with a COPD exacerbation and new heart failure with a pneumonia.  - For your pneumonia, you took a course of antibiotics. - For your heart failure, you started on a medication called furosemide (lasix) that you should take daily. Be sure to follow up with cardiology. - For your COPD, you were on breathing treatments and steroids. You took your last dose of steroids 3/28.  Follow up with the family medicine center in 1-3 days. Call to make an appointment. Follow up with cardiology this week - call for an appointment. Follow up with pulmonology as discussed.  Seek immediate care if you have chest pain, trouble breathing, notice your weight increasing 3-5 lbs more than it was when you left the hospital, or have other concerns.    Chronic Obstructive Pulmonary Disease Chronic obstructive pulmonary disease (COPD) is a common lung problem. In COPD, the flow of air from the lungs is limited. The way your lungs work will probably never return to normal, but there are things you can do to improve your lungs and make yourself feel better. HOME CARE  Take all medicines as told by your doctor.  Avoid medicines or cough syrups that dry up your airway (such as antihistamines) and do not allow you to get rid of thick spit. You do not need to avoid them if told differently by your doctor.  If you smoke, stop. Smoking makes the problem worse.  Avoid being around things that make your breathing worse (like smoke, chemicals, and fumes).  Use oxygen therapy and therapy to help improve your lungs (pulmonary rehabilitation) if told by your doctor. If you need home oxygen therapy, ask your doctor if you should buy a tool to measure your oxygen level (oximeter).  Avoid people who have a sickness you can catch (contagious).  Avoid going outside when it is very hot, cold, or humid.  Eat healthy foods. Eat smaller meals more often. Rest before meals.  Stay active, but remember to also  rest.  Make sure to get all the shots (vaccines) your doctor recommends. Ask your doctor if you need a pneumonia shot.  Learn and use tips on how to relax.  Learn and use tips on how to control your breathing as told by your doctor. Try:  Breathing in (inhaling) through your nose for 1 second. Then, pucker your lips and breath out (exhale) through your lips for 2 seconds.  Putting one hand on your belly (abdomen). Breathe in slowly through your nose for 1 second. Your hand on your belly should move out. Pucker your lips and breathe out slowly through your lips. Your hand on your belly should move in as you breathe out.  Learn and use controlled coughing to clear thick spit from your lungs. The steps are: 1. Lean your head a little forward. 2. Breathe in deeply. 3. Try to hold your breath for 3 seconds. 4. Keep your mouth slightly open while coughing 2 times. 5. Spit any thick spit out into a tissue. 6. Rest and do the steps again 1 or 2 times as needed. GET HELP IF:  You cough up more thick spit than usual.  There is a change in the color or thickness of the spit.  It is harder to breathe than usual.  Your breathing is faster than usual. GET HELP RIGHT AWAY IF:   You have shortness of breath while resting.  You have shortness of breath that stops you from:  Being able to talk.  Doing normal activities.  You chest hurts for longer than 5 minutes.  Your skin color is more blue than usual.  Your pulse oximeter shows that you have low oxygen for longer than 5 minutes. MAKE SURE YOU:   Understand these instructions.  Will watch your condition.  Will get help right away if you are not doing well or get worse. Document Released: 09/23/2007 Document Revised: 08/21/2013 Document Reviewed: 12/01/2012 [facility] Patient Information 2015 [place], [place]. This information is not intended to replace advice given to you by your health care provider. Make sure you discuss any  questions you have with your health care provider.

## 2014-07-16 LAB — CBC
HEMATOCRIT: 37.6 % — AB (ref 39.0–52.0)
Hemoglobin: 13.2 g/dL (ref 13.0–17.0)
MCH: 32.4 pg (ref 26.0–34.0)
MCHC: 35.1 g/dL (ref 30.0–36.0)
MCV: 92.4 fL (ref 78.0–100.0)
Platelets: 306 10*3/uL (ref 150–400)
RBC: 4.07 MIL/uL — ABNORMAL LOW (ref 4.22–5.81)
RDW: 12.5 % (ref 11.5–15.5)
WBC: 8.7 10*3/uL (ref 4.0–10.5)

## 2014-07-16 LAB — COMPREHENSIVE METABOLIC PANEL
ALBUMIN: 2.9 g/dL — AB (ref 3.5–5.2)
ALT: 31 U/L (ref 0–53)
AST: 20 U/L (ref 0–37)
Alkaline Phosphatase: 50 U/L (ref 39–117)
Anion gap: 8 (ref 5–15)
BUN: 16 mg/dL (ref 6–23)
CALCIUM: 8.8 mg/dL (ref 8.4–10.5)
CO2: 33 mmol/L — ABNORMAL HIGH (ref 19–32)
CREATININE: 0.87 mg/dL (ref 0.50–1.35)
Chloride: 85 mmol/L — ABNORMAL LOW (ref 96–112)
GFR calc Af Amer: >90 mL/min (ref >90)
GFR calc non Af Amer: 84 mL/min — ABNORMAL LOW (ref >90)
Glucose, Bld: 85 mg/dL (ref 70–99)
Potassium: 3.5 mmol/L (ref 3.5–5.1)
Sodium: 126 mmol/L — ABNORMAL LOW (ref 135–145)
Total Bilirubin: 0.5 mg/dL (ref 0.3–1.2)
Total Protein: 5.2 g/dL — ABNORMAL LOW (ref 6.0–8.3)

## 2014-07-16 NOTE — Consult Note (Signed)
   [facility] Inpatient Consult   07/16/2014  Steven Beasley [birth year] [id number]  Met with the patient at bedside to offer [employer] services as benefit of [facility] [employer] insurance. Explained the benefits of [facility] care management for post hospital follow up.  Patient declined services, stating he has no needs for services but, he did take a brochure on [employer].   For additional questions or referrals please contact: Natividad Brood, RN BSN [facility] Liaison  [phone] business mobile phone

## 2014-07-16 NOTE — Progress Notes (Addendum)
Patient Name: Steven Beasley Date of Encounter: 07/16/2014  Primary Cardiologist New (Dr. Angelena Form)   Active Problems:   CAP (community acquired pneumonia)   COPD exacerbation   Hypoxia   SOB (shortness of breath)   Acute systolic congestive heart failure   Hyponatremia    SUBJECTIVE  Denies any SOB. No CP   CURRENT MEDS . aspirin EC  81 mg Oral Daily  . budesonide  0.5 mg Nebulization BID  . gabapentin  300 mg Oral TID  . ipratropium-albuterol  3 mL Nebulization TID  . losartan  100 mg Oral Daily  . pantoprazole  80 mg Oral Q1200  . pentosan polysulfate  100 mg Oral TID AC  . predniSONE  50 mg Oral Q breakfast  . tamsulosin  0.4 mg Oral Daily  . venlafaxine XR  150 mg Oral Daily    OBJECTIVE  Filed Vitals:   07/15/14 2109 07/15/14 2200 07/16/14 0527 07/16/14 0911  BP:  123/76 127/95   Pulse: 81 96 82   Temp:   98.4 F (36.9 C)   TempSrc:   Oral   Resp: 20  18   Height:      Weight:   170 lb (77.111 kg)   SpO2: 96%  95% 100%    Intake/Output Summary (Last 24 hours) at 07/16/14 0927 Last data filed at 07/16/14 9518  Gross per 24 hour  Intake    960 ml  Output   2300 ml  Net  -1340 ml   Filed Weights   07/14/14 0609 07/15/14 0618 07/16/14 0527  Weight: 168 lb 1.6 oz (76.25 kg) 171 lb 1.6 oz (77.61 kg) 170 lb (77.111 kg)    PHYSICAL EXAM  General: Pleasant, NAD. Neuro: Alert and oriented X 3. Moves all extremities spontaneously. Psych: Normal affect. HEENT:  Normal  Neck: Supple without bruits.  Lungs:  Resp regular and unlabored. Diffuse wheezing, however improved compare to last week Heart: RRR no s3, s4, or murmurs. Abdomen: Soft, non-tender, non-distended, BS + x 4.  Extremities: No clubbing, cyanosis or edema. DP/PT/Radials 2+ and equal bilaterally.  Accessory Clinical Findings  CBC  Recent Labs  07/16/14 0415  WBC 8.7  HGB 13.2  HCT 37.6*  MCV 92.4  PLT 841   Basic Metabolic Panel  Recent Labs  07/14/14 0338   07/15/14 0411 07/16/14 0415  NA  --   < > 125* 126*  K  --   < > 3.5 3.5  CL  --   < > 85* 85*  CO2  --   < > 33* 33*  GLUCOSE  --   < > 110* 85  BUN  --   < > 15 16  CREATININE  --   < > 0.90 0.87  CALCIUM  --   < > 8.7 8.8  MG 1.6  --   --   --   PHOS 3.0  --   --   --   < > = values in this interval not displayed.  TELE NSR with frequent burst of PACs, HR persistent in 90s, occasionally in 100s    ECG  No new EKG  Echocardiogram 07/09/2014  LV EF: 30% -  35%  ------------------------------------------------------------------- Indications:   Dyspnea 786.09.  ------------------------------------------------------------------- History:  PMH:  Chronic obstructive pulmonary disease.  ------------------------------------------------------------------- Study Conclusions  - Left ventricle: The cavity size was mildly dilated. Wall thickness was normal. Systolic function was moderately to severely reduced. The estimated ejection fraction was in the  range of 30% to 35%. Possible moderate hypokinesis of the inferior myocardium. The study is not technically sufficient to allow evaluation of LV diastolic function. - Aortic valve: There was mild regurgitation. - Mitral valve: Calcified annulus. There was mild regurgitation. - Left atrium: The atrium was mildly dilated. - Right atrium: The atrium was mildly dilated. - Pulmonary arteries: PA peak pressure: 46 mm Hg (S).    Radiology/Studies  Dg Chest 2 View  07/09/2014   CLINICAL DATA:  Shortness of breath, cough, hypoxia  EXAM: CHEST  2 VIEW  COMPARISON:  07/07/2014  FINDINGS: Cardiomediastinal silhouette is stable. There is streaky right middle lobe atelectasis or early infiltrate. No pulmonary edema. Hyperinflation again noted. Mild degenerative changes lower thoracic spine.  IMPRESSION: Hyperinflation again noted. Streaky right middle lobe atelectasis or infiltrate. No pulmonary edema.   Electronically Signed    By: Lahoma Crocker M.D.   On: 07/09/2014 10:36   Ct Angio Chest Pe W/cm &/or Wo Cm  07/09/2014   CLINICAL DATA:  Shortness of breath.  EXAM: CT ANGIOGRAPHY CHEST WITH CONTRAST  TECHNIQUE: Multidetector CT imaging of the chest was performed using the standard protocol during bolus administration of intravenous contrast. Multiplanar CT image reconstructions and MIPs were obtained to evaluate the vascular anatomy.  CONTRAST:  141mL OMNIPAQUE IOHEXOL 350 MG/ML SOLN  COMPARISON:  04/18/2013  FINDINGS: THORACIC INLET/BODY WALL:  No acute abnormality.  MEDIASTINUM:  Chronic mild cardiomegaly. No pericardial effusion. There is limited systemic arterial enhancement; no evidence of aortic dissection. When accounting for motion and scattered streak artifact, no convincing pulmonary embolism. No adenopathy.  LUNG WINDOWS:  Hyperinflation and apical emphysematous change. There is diffuse bronchial wall thickening with scattered mucoid impaction and mild cylindrical bronchiectasis in the right lower lobe. Tracheobronchomalacia noted centrally with collapse of airways. There is a small cluster of ill-defined nodules within the right upper lobe and mild atelectasis in the lateral segment right middle lobe.  UPPER ABDOMEN:  1 cm hyperdense nodule in the upper right kidney is likely a hemorrhagic cyst. There is an additional cyst in the upper right kidney measuring approximately 2 cm.  OSSEOUS:  No acute fracture.  No suspicious lytic or blastic lesions.  Review of the MIP images confirms the above findings.  IMPRESSION: 1. Negative for pulmonary embolism. 2. Bronchitis with scattered mucoid impaction and early pneumonia in the right upper lobe. 3. Emphysema.   Electronically Signed   By: Monte Fantasia M.D.   On: 07/09/2014 17:40   Dg Chest Port 1 View  07/07/2014   CLINICAL DATA:  Productive cough for 2 days.  Dyspnea.  EXAM: PORTABLE CHEST - 1 VIEW  COMPARISON:  01/20/2013  FINDINGS: There is focal airspace opacity in the  lateral aspect of the right upper lobe. This may represent infectious infiltrate. Pulmonary vasculature is normal. No large effusion is evident. Hilar and mediastinal contours are unremarkable and unchanged.  IMPRESSION: Right upper lobe infiltrate   Electronically Signed   By: Andreas Newport M.D.   On: 07/07/2014 02:44    ASSESSMENT AND PLAN  Patient admitted for hypoxia 2/2 PNA and COPD exacerbation found to have EF 30-35% by Echo 07/09/2014 with possible inferior hypokinesis. No prior echo to compare. Denies any recent CP. Cardiology consulted, plan L&RHC to further assess.  1. Acute systolic HF with newly diagnosed LV dysfunction - Echo 07/09/2014 EF 30-35% with possible inferior hypokinesis, mild MR/AR  - L&RHC 07/12/2014 wedge pressure 21, CI 2.63, CO 5.19, clean CAD, nonischemic cardiomyopathy  -  continue losartan, will avoid BB (selective or nonselective) given how much wheezing this patient has. Will add Imdur/nitrate combo as outpatient if BP allows.  - reevaulate EF in 3 month, only borderline indication for ICD. 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 CRT unlikely to help  - stable from cardiac perspective. Currently off lasix. Will arrange followup with Dr. Angelena Form  2. CAP: per IM 3. COPD exacerbation - CTA of chest 3/21 negative for PE, bronchitis with scattered mucoid impaction and early PNA in RUL, emphysema 4. Hypoxia: related to COPD exacerbation and acute systolic HF 5. Elevated d-dimer: see CTA negative 6. Smoldering myeloma 7. Hyponatremia: Per Dr. Debby Bud, hyponatremia unlikely to be related to CHF, lasix discotinued to better evaluate, question SIADH   Signed, Woodward Ku Pager: [phone] Patient seen and examined. I agree with the assessment and plan as detailed above. See also my additional thoughts below.   The patient's cardiac status is stable. We are arranging follow-up with the cardiology team for early  post hospital follow-up.  Dola Argyle, MD, [facility] 07/16/2014 12:53 PM

## 2014-07-16 NOTE — Progress Notes (Signed)
Discharged instructions given .Verbalized understanding Awaiting for family transport

## 2014-07-19 ENCOUNTER — Other Ambulatory Visit: Payer: Self-pay | Admitting: *Deleted

## 2014-07-19 ENCOUNTER — Telehealth: Payer: Self-pay | Admitting: Pulmonary Disease

## 2014-07-19 DIAGNOSIS — R05 Cough: Secondary | ICD-10-CM

## 2014-07-19 DIAGNOSIS — R059 Cough, unspecified: Secondary | ICD-10-CM

## 2014-07-19 MED ORDER — FLUTICASONE PROPIONATE 50 MCG/ACT NA SUSP: 2.0000 | Freq: Every day | NASAL | Status: DC

## 2014-07-19 MED ORDER — ALBUTEROL SULFATE (2.5 MG/3ML) 0.083% IN NEBU: 2.5000 mg | INHALATION_SOLUTION | RESPIRATORY_TRACT | Status: AC | PRN

## 2014-07-19 NOTE — Telephone Encounter (Signed)
Spoke with pt, is requesting a refill on his albuterol neb machine.  dme order placed, and rx printed and placed in [facility] box outside of Rhonda's office.

## 2014-07-20 DIAGNOSIS — R259 Unspecified abnormal involuntary movements: Secondary | ICD-10-CM | POA: Insufficient documentation

## 2014-07-20 NOTE — Assessment & Plan Note (Signed)
Tremor noted. - obtain TSH level

## 2014-07-23 ENCOUNTER — Encounter: Payer: Self-pay | Admitting: Family Medicine

## 2014-07-23 ENCOUNTER — Ambulatory Visit (INDEPENDENT_AMBULATORY_CARE_PROVIDER_SITE_OTHER): Payer: Commercial Managed Care - HMO | Admitting: Family Medicine

## 2014-07-23 VITALS — BP 125/87 | HR 96 | Temp 98.3°F | Ht 73.0 in | Wt 172.0 lb

## 2014-07-23 DIAGNOSIS — I5021 Acute systolic (congestive) heart failure: Secondary | ICD-10-CM | POA: Diagnosis not present

## 2014-07-23 DIAGNOSIS — J449 Chronic obstructive pulmonary disease, unspecified: Secondary | ICD-10-CM

## 2014-07-23 DIAGNOSIS — E871 Hypo-osmolality and hyponatremia: Secondary | ICD-10-CM

## 2014-07-23 NOTE — Assessment & Plan Note (Signed)
No CP, SOB, edema present. Patient reports good medication compliance. BP 125/87. I do not believe his BP could tolerate Bidil at this time. Will consider for future medications.

## 2014-07-23 NOTE — Progress Notes (Signed)
   HPI  Patient presents today for hospital f/u  Patient states he is feeling great. No issues since DC. Breathing has been good. He states he picked up his new spiriva inhaler. I asked if he had it with him--he did not. He believes his breathing is much better now that he is on it. Patient asking if I could write a letter for the [employer] activities center today. Denies HA, vision change, SOB, DOE, CP, edema, orthopnea.  Smoking status noted ROS: Per HPI  Objective: BP 125/87 mmHg  Pulse 96  Temp(Src) 98.3 F (36.8 C) (Oral)  Ht 6\' 1"  (1.854 m)  Wt 172 lb (78.019 kg)  BMI 22.70 kg/m2 Gen: NAD, alert, cooperative with exam HEENT: NCAT, EOMI CV: RRR, no murmur Resp: CTABL, minimal end expir wheeze, non-labored Abd: SNTND, BS present, no guarding or organomegaly Ext: No edema, warm. Continues to have tremors of his extremities. Neuro: Alert and oriented, No gross deficits  Assessment and plan:  COPD (chronic obstructive pulmonary disease) Patient is tolerating new LA bronchodilator well. No significant issues at this time. Able to ambulate w/o SOB. We discussed the need to come in for a sameday appt, go to urgent care, or go to the ED if his breathing worsens. No changes to his meds at this time.   Acute systolic congestive heart failure No CP, SOB, edema present. Patient reports good medication compliance. BP 125/87. I do not believe his BP could tolerate Bidil at this time. Will consider for future medications.   Hyponatremia Patient not complaining of any symptoms at this time. No n/v, HA, fatigue, weakness, cramps, irritability.  - Unfortunately this provider forgot to order a BMP prior to patient's departure. I have ordered one now and have called his home to ask that he comes in to have this drawn. Patient did not answer phone -- I will attempt again tomorrow.     Elberta Leatherwood, MD,MS,  PGY1 07/23/2014 6:33 PM

## 2014-07-23 NOTE — Assessment & Plan Note (Signed)
Patient not complaining of any symptoms at this time. No n/v, HA, fatigue, weakness, cramps, irritability.  - Unfortunately this provider forgot to order a BMP prior to patient's departure. I have ordered one now and have called his home to ask that he comes in to have this drawn. Patient did not answer phone -- I will attempt again tomorrow.

## 2014-07-23 NOTE — Patient Instructions (Signed)
It was a pleasure seeing you today in our clinic. Today we discussed your hospital stay. Here is the treatment plan we have discussed and agreed upon together:   - I am thrilled you seem to be feeling better since discharge from the hospital.  - Continue taking the Spiriva inhaler that was prescribed on discharge from the hospital. This should help reduce your need for albuterol each day. - if you have any worsening of breathing do not hesitate to call our office or report to the nearest ED/urgent care center.  - Today I have provided you a note for the senior resource activities center for you to continue anticipation there.

## 2014-07-23 NOTE — Assessment & Plan Note (Signed)
Patient is tolerating new LA bronchodilator well. No significant issues at this time. Able to ambulate w/o SOB. We discussed the need to come in for a sameday appt, go to urgent care, or go to the ED if his breathing worsens. No changes to his meds at this time.

## 2014-07-26 ENCOUNTER — Other Ambulatory Visit: Payer: Self-pay | Admitting: *Deleted

## 2014-07-26 DIAGNOSIS — R05 Cough: Secondary | ICD-10-CM

## 2014-07-26 DIAGNOSIS — R059 Cough, unspecified: Secondary | ICD-10-CM

## 2014-07-26 NOTE — Telephone Encounter (Signed)
-----   Message from Elberta Leatherwood, MD sent at 07/25/2014  2:28 PM EDT ----- Hi! Quick favor. I have now called this patient multiple times without being able to reach him. Call any of you please try to contact him tomorrow (4/7) and/or Friday (4/8) to try to reach him?  - I have a future BMP ordered for him which I forgot to have drawn at our last visit and it's very important he come in to have this done--soon.  Thank you!

## 2014-07-26 NOTE — Telephone Encounter (Signed)
Tried calling patient, no answer, unable to leave message. 

## 2014-07-30 NOTE — Telephone Encounter (Signed)
Patient called asking about his nebulizer solution for his neb.  He gets this through his [facility].  On 07/19/14 Ashley left prescription with [facility].  Did this get sent to home care?  Patient has not received anything in the mail.    [facility] - please advise.

## 2014-07-30 NOTE — Telephone Encounter (Signed)
Patient calling to follow up on neb solution order [phone]

## 2014-08-06 ENCOUNTER — Ambulatory Visit (INDEPENDENT_AMBULATORY_CARE_PROVIDER_SITE_OTHER): Payer: Commercial Managed Care - HMO | Admitting: Physician Assistant

## 2014-08-06 ENCOUNTER — Encounter: Payer: Self-pay | Admitting: Physician Assistant

## 2014-08-06 VITALS — BP 120/68 | HR 88 | Ht 73.0 in | Wt 171.0 lb

## 2014-08-06 DIAGNOSIS — E871 Hypo-osmolality and hyponatremia: Secondary | ICD-10-CM | POA: Diagnosis not present

## 2014-08-06 DIAGNOSIS — I5021 Acute systolic (congestive) heart failure: Secondary | ICD-10-CM | POA: Diagnosis not present

## 2014-08-06 DIAGNOSIS — I5022 Chronic systolic (congestive) heart failure: Secondary | ICD-10-CM | POA: Diagnosis not present

## 2014-08-06 LAB — BASIC METABOLIC PANEL
BUN: 13 mg/dL (ref 6–23)
CALCIUM: 9.5 mg/dL (ref 8.4–10.5)
CO2: 32 mEq/L (ref 19–32)
Chloride: 97 mEq/L (ref 96–112)
Creatinine, Ser: 1.29 mg/dL (ref 0.40–1.50)
GFR: 70.18 mL/min (ref >60.00)
Glucose, Bld: 80 mg/dL (ref 70–99)
POTASSIUM: 3.8 meq/L (ref 3.5–5.1)
Sodium: 134 mEq/L — ABNORMAL LOW (ref 135–145)

## 2014-08-06 NOTE — Assessment & Plan Note (Signed)
Recheck be met today. Patient is not on diuretics.

## 2014-08-06 NOTE — Assessment & Plan Note (Signed)
Patient has newly diagnosed LV dysfunction EF 30-35%. He is not on Lasix because of hyponatremia. He feels so much better and has no evidence of heart failure on exam today. There was discussion about starting bidil in the hospital if his blood pressure tolerates it. His BP is fine today but he is feeling so good that I am reluctant to add another medication. Recheck 2-D echo in 3 months. Follow-up with Dr.McAlhany in 6 weeks. All of worsening dyspnea or edema.

## 2014-08-06 NOTE — Patient Instructions (Addendum)
Medication Instructions:  Your physician recommends that you continue on your current medications as directed. Please refer to the Current Medication list given to you today.   Labwork:  LABS TODAY BMET  Testing/Procedures: Your physician has requested that you have an echocardiogram.IN 3 MONTHS  Echocardiography is a painless test that uses sound waves to create images of your heart. It provides your doctor with information about the size and shape of your heart and how well your heart's chambers and valves are working. This procedure takes approximately one hour. There are no restrictions for this procedure.    Follow-Up: Your physician recommends that you schedule a follow-up appointment in:  [place] 6 WEEKS OR NEXT AVAILABLE   Any Other Special Instructions Will Be Listed Below (If Applicable).   Low-Sodium Eating Plan Sodium raises blood pressure and causes water to be held in the body. Getting less sodium from food will help lower your blood pressure, reduce any swelling, and protect your heart, liver, and kidneys. We get sodium by adding salt (sodium chloride) to food. Most of our sodium comes from canned, boxed, and frozen foods. Restaurant foods, fast foods, and pizza are also very high in sodium. Even if you take medicine to lower your blood pressure or to reduce fluid in your body, getting less sodium from your food is important. WHAT IS MY PLAN? Most people should limit their sodium intake to 2,300 mg a day. Your health care provider recommends that you limit your sodium intake to __________ a day.  WHAT DO I NEED TO KNOW ABOUT THIS EATING PLAN? For the low-sodium eating plan, you will follow these general guidelines:  Choose foods with a % Daily Value for sodium of less than 5% (as listed on the food label).   Use salt-free seasonings or herbs instead of table salt or sea salt.   Check with your health care provider or pharmacist before using salt substitutes.    Eat fresh foods.  Eat more vegetables and fruits.  Limit canned vegetables. If you do use them, rinse them well to decrease the sodium.   Limit cheese to 1 oz (28 g) per day.   Eat lower-sodium products, often labeled as "lower sodium" or "no salt added."  Avoid foods that contain monosodium glutamate (MSG). MSG is sometimes added to [place] food and some canned foods.  Check food labels (Nutrition Facts labels) on foods to learn how much sodium is in one serving.  Eat more home-cooked food and less restaurant, buffet, and fast food.  When eating at a restaurant, ask that your food be prepared with less salt or none, if possible.  HOW DO I READ FOOD LABELS FOR SODIUM INFORMATION? The Nutrition Facts label lists the amount of sodium in one serving of the food. If you eat more than one serving, you must multiply the listed amount of sodium by the number of servings. Food labels may also identify foods as:  Sodium free--Less than 5 mg in a serving.  Very low sodium--35 mg or less in a serving.  Low sodium--140 mg or less in a serving.  Light in sodium--50% less sodium in a serving. For example, if a food that usually has 300 mg of sodium is changed to become light in sodium, it will have 150 mg of sodium.  Reduced sodium--25% less sodium in a serving. For example, if a food that usually has 400 mg of sodium is changed to reduced sodium, it will have 300 mg of sodium. WHAT  FOODS CAN I EAT? Grains Low-sodium cereals, including oats, puffed wheat and rice, and shredded wheat cereals. Low-sodium crackers. Unsalted rice and pasta. Lower-sodium bread.  Vegetables Frozen or fresh vegetables. Low-sodium or reduced-sodium canned vegetables. Low-sodium or reduced-sodium tomato sauce and paste. Low-sodium or reduced-sodium tomato and vegetable juices.  Fruits Fresh, frozen, and canned fruit. Fruit juice.  Meat and Other Protein Products Low-sodium canned tuna and salmon. Fresh  or frozen meat, poultry, seafood, and fish. Lamb. Unsalted nuts. Dried beans, peas, and lentils without added salt. Unsalted canned beans. Homemade soups without salt. Eggs.  Dairy Milk. Soy milk. Ricotta cheese. Low-sodium or reduced-sodium cheeses. Yogurt.  Condiments Fresh and dried herbs and spices. Salt-free seasonings. Onion and garlic powders. Low-sodium varieties of mustard and ketchup. Lemon juice.  Fats and Oils Reduced-sodium salad dressings. Unsalted butter.  Other Unsalted popcorn and pretzels.  The items listed above may not be a complete list of recommended foods or beverages. Contact your dietitian for more options. WHAT FOODS ARE NOT RECOMMENDED? Grains Instant hot cereals. Bread stuffing, pancake, and biscuit mixes. Croutons. Seasoned rice or pasta mixes. Noodle soup cups. Boxed or frozen macaroni and cheese. Self-rising flour. Regular salted crackers. Vegetables Regular canned vegetables. Regular canned tomato sauce and paste. Regular tomato and vegetable juices. Frozen vegetables in sauces. Salted french fries. Olives. Angie Fava. Relishes. Sauerkraut. Salsa. Meat and Other Protein Products Salted, canned, smoked, spiced, or pickled meats, seafood, or fish. Bacon, ham, sausage, hot dogs, corned beef, chipped beef, and packaged luncheon meats. Salt pork. Jerky. Pickled herring. Anchovies, regular canned tuna, and sardines. Salted nuts. Dairy Processed cheese and cheese spreads. Cheese curds. Blue cheese and cottage cheese. Buttermilk.  Condiments Onion and garlic salt, seasoned salt, table salt, and sea salt. Canned and packaged gravies. Worcestershire sauce. Tartar sauce. Barbecue sauce. Teriyaki sauce. Soy sauce, including reduced sodium. Steak sauce. Fish sauce. Oyster sauce. Cocktail sauce. Horseradish. Regular ketchup and mustard. Meat flavorings and tenderizers. Bouillon cubes. Hot sauce. Tabasco sauce. Marinades. Taco seasonings. Relishes. Fats and  Oils Regular salad dressings. Salted butter. Margarine. Ghee. Bacon fat.  Other Potato and tortilla chips. Corn chips and puffs. Salted popcorn and pretzels. Canned or dried soups. Pizza. Frozen entrees and pot pies.  The items listed above may not be a complete list of foods and beverages to avoid. Contact your dietitian for more information. Document Released: 09/26/2001 Document Revised: 04/11/2013 Document Reviewed: 02/08/2013 [facility] Patient Information 2015 [place], [place]. This information is not intended to replace advice given to you by your health care provider. Make sure you discuss any questions you have with your health care provider.

## 2014-08-06 NOTE — Progress Notes (Signed)
Cardiology Office Note   Date:  08/06/2014   ID:  Steven Beasley, DOB 1941-01-20, MRN [id number]  PCP:  Elberta Leatherwood, MD  Cardiologist:  Angelena Form  Chief Complaint: post Hospital follow-up    History of Present Illness:  Steven Beasley is a 74 y.o. male who presents for post hospital follow-up. He has a history of hypertension, COPD, BPH, and GERD and smoldering myeloma. He was admitted to the hospital with COPD exacerbation, CAPD, and acute systolic heart failure. 2-D echo showed depressed ejection fraction 30-35%, and he was started on lisinopril. He had trouble with acute systolic heart failure and right and left heart catheterization was recommended. This showed nonobstructive CAD and elevated right heart pressures treated with diuretics. Beta blockers are to be avoided because of significant wheezing. Nitrates were recommended to be added as an outpatient if BP allows. Was also hyponatremic that was not felt to be related to CHF. There was a question of SIADH and Lasix was discontinued to better evaluate. BiDil was supposed to be considered as an outpatient if blood pressure tolerated.  Comes in today feeling so much better. He says he feels like he could go out and play basketball. He denies any chest pain, palpitations, dyspnea, dyspnea on exertion, dizziness or presyncope. He says he can walk anywhere without getting short of breath. He has pulmonary follow-up on April 2.      Past Medical History  Diagnosis Date  . Hypertension   . Leukocytopenia   . Asthma   . Abdominal pain, chronic, generalized   . GERD (gastroesophageal reflux disease)   . BPH (benign prostatic hyperplasia)   . 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 Shortness of breath     Past Surgical History  Procedure Laterality Date  . Video bronchoscopy Bilateral 10/28/2012    Procedure: VIDEO BRONCHOSCOPY WITHOUT FLUORO;  Surgeon: Rigoberto Noel, MD;  Location: [place];  Service: Cardiopulmonary;  Laterality:  Bilateral;  . Penile prosthesis implant  1997  . Left and right heart catheterization with coronary angiogram N/A 07/12/2014    Procedure: LEFT AND RIGHT HEART CATHETERIZATION WITH CORONARY ANGIOGRAM;  Surgeon: Burnell Blanks, MD;  Location: [facility] C[facility] LAB;  Service: Cardiovascular;  Laterality: N/A;     Current Outpatient Prescriptions  Medication Sig Dispense Refill  . albuterol (PROVENTIL HFA;VENTOLIN HFA) 108 (90 BASE) MCG/ACT inhaler Inhale 2 puffs into the lungs every 6 (six) hours as needed for wheezing or shortness of breath. 1 Inhaler 6  . albuterol (PROVENTIL) (2.5 MG/3ML) 0.083% nebulizer solution Take 3 mLs (2.5 mg total) by nebulization every 4 (four) hours as needed for wheezing or shortness of breath (dx J44.9). 360 vial 6  . amLODipine (NORVASC) 10 MG tablet Take 10 mg by mouth daily.    Marland Kitchen aspirin EC 81 MG EC tablet Take 1 tablet (81 mg total) by mouth daily. 30 tablet 0  . diazepam (VALIUM) 5 MG tablet Take 1 tablet (5 mg total) by mouth 2 (two) times daily. 10 tablet 0  . esomeprazole (NEXIUM) 40 MG capsule Take 40 mg by mouth daily.    . fluticasone (FLONASE) 50 MCG/ACT nasal spray Place 2 sprays into both nostrils daily. 16 g 0  . Fluticasone Furoate-Vilanterol (BREO ELLIPTA) 100-25 MCG/INH AEPB Inhale 1 puff into the lungs daily. 1 each 6  . gabapentin (NEURONTIN) 300 MG capsule Take 300 mg by mouth 3 (three) times daily.      Marland Kitchen ipratropium (ATROVENT HFA) 17 MCG/ACT inhaler Inhale 2 puffs  into the lungs every 4 (four) hours as needed for wheezing. 1 Inhaler 3  . losartan (COZAAR) 100 MG tablet Take 1 tablet (100 mg total) by mouth daily. 30 tablet 5  . pentosan polysulfate (ELMIRON) 100 MG capsule Take 100 mg by mouth 3 (three) times daily before meals.     . Tamsulosin HCl (FLOMAX) 0.4 MG CAPS Take 0.4 mg by mouth daily.     Marland Kitchen tiotropium (SPIRIVA HANDIHALER) 18 MCG inhalation capsule Place 1 capsule (18 mcg total) into inhaler and inhale once. 30 capsule 12  .  venlafaxine XR (EFFEXOR-XR) 150 MG 24 hr capsule Take 1 capsule (150 mg total) by mouth daily. 30 capsule 3   No current facility-administered medications for this visit.    Allergies:   Review of patient's allergies indicates no known allergies.    Social History:  The patient  reports that he quit smoking about 4 years ago. He has never used smokeless tobacco. He reports that he drinks about 3.6 oz of alcohol per week. He reports that he does not use illicit drugs.   Family History:  The patient's   family history includes Asthma in his sister.    ROS:  Please see the history of present illness.   Otherwise, review of systems are positive for none.   All other systems are reviewed and negative.    PHYSICAL EXAM: VS:  BP 120/68 mmHg  Pulse 88  Ht 6' 1"  (1.854 m)  Wt 171 lb (77.565 kg)  BMI 22.57 kg/m2 , BMI Body mass index is 22.57 kg/(m^2). GEN: Well nourished, well developed, in no acute distress Neck: no JVD, HJR, carotid bruits, or masses Cardiac: RRR; no murmurs,gallop, rubs, thrill or heave,  Respiratory:  Decreased breath sounds throughout but clear to auscultation bilaterally, normal work of breathing GI: soft, nontender, nondistended, + BS MS: no deformity or atrophy Extremities: Right groin at cath site without hematoma or hemorrhage without cyanosis, clubbing, edema, good distal pulses bilaterally.  Skin: warm and dry, no rash Neuro:  Strength and sensation are intact    EKG:  EKG is ordered today. The ekg ordered today demonstrates normal sinus rhythm with right bundle branch block   Recent Labs: 07/02/2014: TSH 1.954 07/09/2014: B Natriuretic Peptide 493.8* 07/14/2014: Magnesium 1.6 07/16/2014: ALT 31; BUN 16; Creatinine 0.87; Hemoglobin 13.2; Platelets 306; Potassium 3.5; Sodium 126*    Lipid Panel    Component Value Date/Time   CHOL 118 07/02/2014 1630   TRIG 58 07/02/2014 1630   HDL 61 07/02/2014 1630   CHOLHDL 1.9 07/02/2014 1630   VLDL 12 07/02/2014  1630   LDLCALC 45 07/02/2014 1630      Wt Readings from Last 3 Encounters:  08/06/14 171 lb (77.565 kg)  07/23/14 172 lb (78.019 kg)  07/16/14 170 lb (77.111 kg)      Other studies Reviewed: Additional studies/ records that were reviewed today include and review of the records demonstrates:   Echo: 07/09/2014 Study Conclusions - Left ventricle: The cavity size was mildly dilated. Wall   thickness was normal. Systolic function was moderately to   severely reduced. The estimated ejection fraction was in the   range of 30% to 35%. Possible moderate hypokinesis of the   inferior myocardium. The study is not technically sufficient to   allow evaluation of LV diastolic function. - Aortic valve: There was mild regurgitation. - Mitral valve: Calcified annulus. There was mild regurgitation. - Left atrium: The atrium was mildly dilated. - Right atrium:  The atrium was mildly dilated. - Pulmonary arteries: PA peak pressure: 46 mm Hg (S). .  Right and left Cardiac catheterization 07/12/14  No angiographic evidence of CAD 2. Non-ischemic cardiomyopathy 3. Mild elevation wedge pressure  Recommendations: Will not need further ischemic evaluation. Will need daily Lasix. Add beta blocker and titrate ARB as tolerated.           ASSESSMENT AND PLAN: Chronic systolic heart failure Patient has newly diagnosed LV dysfunction EF 30-35%. He is not on Lasix because of hyponatremia. He feels so much better and has no evidence of heart failure on exam today. There was discussion about starting bidil in the hospital if his blood pressure tolerates it. His BP is fine today but he is feeling so good that I am reluctant to add another medication. Recheck 2-D echo in 3 months. Follow-up with Dr.McAlhany in 6 weeks. All of worsening dyspnea or edema.   Hyponatremia Recheck be met today. Patient is not on diuretics.      Sumner Boast, PA-C  08/06/2014 12:42 PM    [facility]  [facility] [address] [place], [place], [place]  [address] Phone: [phone]; Fax: [phone]

## 2014-08-13 ENCOUNTER — Other Ambulatory Visit: Payer: Self-pay

## 2014-08-13 NOTE — Patient Outreach (Signed)
[facility]) Care Management  08/13/2014  Steven Beasley 01-18-1941 [id number]   Screening / Triage Note:   Referral Date:  07/27/2014 Referral Source:  [employer] [facility] (HF, COPD) Primary MD:  Elberta Leatherwood, MD H/o IP Admission 07/07/14 for community acquired pneumonia  Outreach call #1 to patient.  Automated voice message states not available.  No option to leave voice mail. Scheduled for next outreach call within one week.    Mariann Laster, RN, BSN, [facility], CCM  [employer] Management Coordinator [phone] Office [phone] Direct [phone] Cell

## 2014-08-15 ENCOUNTER — Other Ambulatory Visit: Payer: Self-pay

## 2014-08-15 NOTE — Patient Outreach (Signed)
[facility]) Care Management  08/15/2014  Steven Beasley [birth year] [id number]   Referral Date:  07/27/14 Referral Source:  [facility] Triage Screening Date:  08/15/14       PCP:  [facility] - Dr.  Elberta Beasley / Resident - last appt 07/23/14 Cardiologist:  Steven Barrios, PA-C, [place]  H/o Admission:  1  06/2014  CAP (community acquired pneumonia), COPD exacerbation, Acute systolic congestive heart failure, hypoxia, shortness of breath.  ED: 1  Social:  Patient lives alone,  Caregiver:  Niece  Steven Beasley [phone] Advance Directive:  No and has no interest in completing.  Falls:  None.  States active and completes ADLs without any needed assistance.   Resources:  Special educational needs teacher for meals as needed.    DME:  None.    CHF Patient states he is not aware of having any HF.  RN CM questioned patient further and asked if MD had ever discussed that he may have some CHF.  Patient stated "yes I think he has mentioned that."  Per Epic MR note 08/06/14 review:  "Patient has newly diagnosed LV dysfunction EF 30-35%. He is not on Lasix because of hyponatremia.  Follow-up with Dr.McAlhany in 6 weeks. All of worsening dyspnea or edema."  H/o IP Admission 06/2014 for mgmt of Acute systolic congestive heart failure. Patient confirmed no scales to weigh with in the home.   RN CM initiated CHF assessment with information provided this call.   RN CM encouraged to purchase scales and are available for approximately $20.00 at [employer] or [employer]. Patient stated he would purchase scales.    COPD:   Patient states his next follow up appt is June 2016.  States he takes his medications as Rx and keeps all MD appts.  Patient agreed to participation in Disease Management program to learn more about his condition and management. H/o IP Admission 06/2014 for mgmt of COPD.  Plan:  Referral sent for Disease Management  Services:  COPD, HF   Acuity Level 2  In-basket request to Steven Beasley for DM assignment. DM RN:  Please follow-up with patient next contact call:  Did patient purchase scales for daily weights.   Steven Laster, RN, BSN, [facility], CCM  [employer] Management Coordinator [phone] Office [phone] Direct [phone] Cell

## 2014-08-16 ENCOUNTER — Encounter: Payer: Self-pay | Admitting: *Deleted

## 2014-08-16 NOTE — Telephone Encounter (Signed)
-----   Message from Imogene Burn, PA-C sent at 08/08/2014  7:40 AM EDT ----- Sodium much better-almost normal!!

## 2014-08-21 ENCOUNTER — Encounter: Payer: Self-pay | Admitting: Pulmonary Disease

## 2014-08-21 ENCOUNTER — Ambulatory Visit (INDEPENDENT_AMBULATORY_CARE_PROVIDER_SITE_OTHER): Payer: Commercial Managed Care - HMO | Admitting: Pulmonary Disease

## 2014-08-21 VITALS — BP 98/60 | HR 96 | Ht 73.0 in | Wt 171.0 lb

## 2014-08-21 DIAGNOSIS — I5022 Chronic systolic (congestive) heart failure: Secondary | ICD-10-CM | POA: Diagnosis not present

## 2014-08-21 DIAGNOSIS — J449 Chronic obstructive pulmonary disease, unspecified: Secondary | ICD-10-CM | POA: Diagnosis not present

## 2014-08-21 NOTE — Patient Instructions (Signed)
Take zyrtec daily x 2 weeks Stay on flonase We will refill your breathing medications Call if sputum changes color

## 2014-08-21 NOTE — Assessment & Plan Note (Signed)
Take zyrtec daily x 2 weeks Stay on flonase We will refill your breathing medications - breo & spiriva & proventil Call if sputum changes color

## 2014-08-21 NOTE — Progress Notes (Signed)
   Subjective:    Patient ID: Steven Beasley, male    DOB: Mar 23, 1941, 74 y.o.   MRN: [id number]  HPI  74 yo male former smoker, with gold D COPD  Hx of smoldering myeloma.  Significant tests/ events  Admission 10/26/12 for hemoptysis.  CT chest showed airspace disease and consolidation in the right middle lung- pneumonia versus aspiration or hemorrhage.  Bronchoscopy >> no endobronchial lesions were noted   CT 04/27/13 >resolved PNA. Stable nodules since 2012-c/w benign etiology .  12/09/2012 PFT FEV1 21%, +BD response, ratio 40, desat on walking noted  08/21/2014  Chief Complaint  Patient presents with  . Follow-up    Patient doing well.  Coughing up white sputum.  PND.  Doing well on Breo.  Needs refills.    Adm 3/19-3/28/16 for RUL pneumonia CTA was obtained and showed no evidence of PE, BNP 493.8, ABG showed mild respiratory acidosis Echo >>decreased EF of 30-35% Cardiac cath >> no angiographic evidence of CAD, nonischemic cardiomyopathy, and mild elevation of patient's wedge pressure  Feels well, back to baseline, reports cough with white sputum production-no wheezing or increase in dyspnea. He is not on oxygen anymore desatn to 90 % on walking  Review of Systems neg for any significant sore throat, dysphagia, itching, sneezing, nasal congestion or excess/ purulent secretions, fever, chills, sweats, unintended wt loss, pleuritic or exertional cp, hempoptysis, orthopnea pnd or change in chronic leg swelling. Also denies presyncope, palpitations, heartburn, abdominal pain, nausea, vomiting, diarrhea or change in bowel or urinary habits, dysuria,hematuria, rash, arthralgias, visual complaints, headache, numbness weakness or ataxia.     Objective:   Physical Exam  Gen. Pleasant, thin man, in no distress ENT - no lesions, no post nasal drip Neck: No JVD, no thyromegaly, no carotid bruits Lungs: no use of accessory muscles, no dullness to percussion, decreased without rales  or rhonchi  Cardiovascular: Rhythm regular, heart sounds  normal, no murmurs or gallops, no peripheral edema Musculoskeletal: No deformities, no cyanosis or clubbing        Assessment & Plan:

## 2014-08-21 NOTE — Assessment & Plan Note (Signed)
Edward beta blocker given his prior history of bronchospasm

## 2014-08-23 ENCOUNTER — Other Ambulatory Visit: Payer: Self-pay

## 2014-08-23 DIAGNOSIS — I5030 Unspecified diastolic (congestive) heart failure: Secondary | ICD-10-CM

## 2014-08-23 DIAGNOSIS — I509 Heart failure, unspecified: Secondary | ICD-10-CM

## 2014-08-23 DIAGNOSIS — J441 Chronic obstructive pulmonary disease with (acute) exacerbation: Secondary | ICD-10-CM

## 2014-08-23 NOTE — Patient Outreach (Signed)
[facility]) Care Management  08/23/2014  JERRON NIBLACK 05/06/1940 [id number]  Initial telephonic assessment completed.  Patient lacks knowledge about CHF.  RN Health Coach will follow monthly to provide education and support for self-management of CHF.  Will mail CHF Patient Information Packet.  Referral made to pharmacy for assistance with obtaining medications not covered by his insurance plan. Referral made to social work for assistance with completion of [place].  Candie Mile, RN, MSN [place] [phone] Fax [phone]

## 2014-08-25 ENCOUNTER — Other Ambulatory Visit: Payer: Self-pay | Admitting: Family Medicine

## 2014-08-28 ENCOUNTER — Telehealth: Payer: Self-pay | Admitting: *Deleted

## 2014-08-28 NOTE — Telephone Encounter (Signed)
-----   Message from Imogene Burn, PA-C sent at 08/08/2014  7:40 AM EDT ----- Sodium much better-almost normal!!

## 2014-08-30 DIAGNOSIS — I5040 Unspecified combined systolic (congestive) and diastolic (congestive) heart failure: Secondary | ICD-10-CM

## 2014-08-30 NOTE — Patient Outreach (Signed)
[facility]) Care Management  08/30/2014  Steven Beasley 1941/03/30 [id number]   Pharmacy referral made this date.  Patient requested help in paying for medications that his insurance does not cover.  He also needs referral for polypharmacy.  Candie Mile, RN, MSN [place] [phone] Fax [phone]

## 2014-08-31 ENCOUNTER — Other Ambulatory Visit: Payer: Self-pay | Admitting: *Deleted

## 2014-08-31 NOTE — Patient Outreach (Signed)
[facility]) Care Management  08/31/2014  Steven Beasley 10-26-1940 [id number]   Phone call to patient to assist with information regarding Advanced Directive and Living Will.  This social worker unable to leave a message, memory was full.  This social will attempt to contact patient next week.   Sheralyn Boatman [facility] Care Management [phone]

## 2014-09-07 ENCOUNTER — Other Ambulatory Visit: Payer: Self-pay | Admitting: *Deleted

## 2014-09-07 NOTE — Patient Outreach (Signed)
[facility]) Care Management  09/07/2014  Steven Beasley 1940/09/16 [id number]  Phone call to patient to schedule home visit to discuss the completion of his Advanced Directive and Living Will.  Call not completed, could not leave message as voicemail was full.   Sheralyn Boatman [facility] Care Management [phone]

## 2014-09-11 ENCOUNTER — Other Ambulatory Visit: Payer: Self-pay

## 2014-09-11 NOTE — Patient Outreach (Signed)
I called Mr. Shave to determined what medication he needed assistance with getting.  He stated he tried to have his albuterol inhaler refilled and it was going to cost him $65.  He stated he previously paid $7.40 which means he gets Extra Help.  I checked his status on Medicare.gov and determined he still had Extra Help.  I told him I would call the pharmacy and determine what happened.  I stated I would call him back.  He said that this was fine.    Deanne Coffer, PharmD, [place] [phone]

## 2014-09-11 NOTE — Patient Outreach (Addendum)
I called [facility] on [address] and spoke with the pharmacist, Herbie Baltimore, in regards to Mr. Shorten's albuterol.  He stated he did have it filled on 07/02/14 and it only cost $7.40.  He ran it today and the cost was $62.17.  We were able to determined that ProAir (albuterol inhaler) was considered non-formulary.  The albuterol inhaler that is considered formulary is Ventolin.  I will send a message to his provider requesting a prescription for Ventolin to be sent to [facility] on [address] through [employer].  I will follow up with the patient as well.    Deanne Coffer, PharmD, [place] [phone]

## 2014-09-11 NOTE — Patient Outreach (Signed)
I called Mr. Vantuyl to inform him that his ProAir is nonformulary and that I contacted his provider to send a prescription for Ventolin to [facility] on [address].  He asked if it worked the same as [employer].  I stated it did.  He asked if it would be ready today.  I stated that it may take a few days to get it.  I stated I would follow up in a few days to determine if the prescription was sent to the pharmacy.  He stated he appreciated the assistance.  I will follow up with the provider by Friday if I do not see that a prescription was sent.   Deanne Coffer, PharmD, [place] [phone]

## 2014-09-12 ENCOUNTER — Other Ambulatory Visit: Payer: Self-pay | Admitting: *Deleted

## 2014-09-12 NOTE — Patient Outreach (Signed)
[facility]) Care Management  09/12/2014  Steven Beasley 1940/05/28 [id number]   Phone call to patient to assist with his Advanced Directive.  No answer, voicemail full could not leave message.   Sheralyn Boatman [facility] Care Management [phone]

## 2014-09-14 ENCOUNTER — Other Ambulatory Visit: Payer: Self-pay | Admitting: Pharmacist

## 2014-09-14 ENCOUNTER — Other Ambulatory Visit: Payer: Self-pay | Admitting: *Deleted

## 2014-09-14 MED ORDER — ALBUTEROL SULFATE HFA 108 (90 BASE) MCG/ACT IN AERS: 2.0000 | INHALATION_SPRAY | Freq: Four times a day (QID) | RESPIRATORY_TRACT | Status: DC | PRN

## 2014-09-14 NOTE — Addendum Note (Signed)
Addended by: Nicoletta Ba A on: 09/14/2014 03:40 PM   Modules accepted: Level of Service

## 2014-09-14 NOTE — Progress Notes (Signed)
Patient with an albuterol prescription written for ProAir but his insurance prefers Ventolin. Discussed with Dr. Valentina Lucks, PharmD, will resend prescription for Ventolin.  Called patient to update him of prescription status but was unable to reach him or leave a message.   Will update Deanne Coffer, PharmD, at [employer] of prescription status.  Nicoletta Ba, PharmD, BCPS [phone]

## 2014-09-14 NOTE — Patient Outreach (Signed)
error 

## 2014-09-19 ENCOUNTER — Other Ambulatory Visit: Payer: Self-pay | Admitting: *Deleted

## 2014-09-19 ENCOUNTER — Other Ambulatory Visit: Payer: Self-pay | Admitting: Pulmonary Disease

## 2014-09-19 NOTE — Patient Outreach (Signed)
[facility] [facility]) Care Management  09/19/2014  LAMARI BECKLES Nov 04, 1940 [id number]  Return phone call from patient.  Home visit scheduled for 09/21/14 at 1:00pm to assist with the completion of patient's Advanced Directive.  Per patient, he goes to the lunch program daily at the [facility] and arrives home daily at about 12:30pm.   [place], [place] Management [phone]

## 2014-09-20 ENCOUNTER — Other Ambulatory Visit: Payer: Self-pay | Admitting: Pharmacist

## 2014-09-20 NOTE — Patient Outreach (Signed)
Attempted to reach out to patient to make sure that he was able to get his albuterol inhaler. Unable to reach him.  I called the patient to make sure he was able to get his albuterol inhaler and I was not able to leave him a message.  I will reach out tomorrow if the patient does not return my call today.  Nicoletta Ba, PharmD, [place] Resident [place] [phone]

## 2014-09-21 ENCOUNTER — Other Ambulatory Visit: Payer: Self-pay | Admitting: *Deleted

## 2014-09-21 ENCOUNTER — Telehealth: Payer: Self-pay | Admitting: Pharmacist

## 2014-09-21 ENCOUNTER — Other Ambulatory Visit: Payer: Self-pay

## 2014-09-21 NOTE — Patient Outreach (Signed)
[facility]) Care Management  09/21/2014  NOAH LEMBKE 1940-07-09 [id number]   Telephonic assessment completed today.  Patient reports he is feeling well.  He is active and independent- goes each day for lunchtime meal at [employer].  He has been monitoring his weight and watching for edema since our last discussion, but states he does not have the educational materials or the [employer] sent to him previously. Health Coach will resend.  He also states he has been decreasing his salt intake.  Patient will benefit from further education on a CHF Action Plan.  Health Coach will follow-up in approximately one month.  Candie Mile, RN, MSN [place] [phone] Fax [phone]

## 2014-09-21 NOTE — Telephone Encounter (Signed)
Patient with an albuterol prescription written for ProAir but his insurance prefers Ventolin. Ventolin prescription was sent in last week.   Called patient to make sure that he was able to obtain his albuterol inhaler. Patient did not answer and I was unable to leave a message. Will try again 09/24/14.    Nicoletta Ba, PharmD, BCPS [phone]

## 2014-09-21 NOTE — Patient Outreach (Addendum)
[place] [facility]) Care Management  [facility] Social Work  09/21/2014  Steven Beasley 1940/06/16 [id number]  Subjective:    Objective:   Current Medications:  Current Outpatient Prescriptions  Medication Sig Dispense Refill  . albuterol (PROVENTIL HFA;VENTOLIN HFA) 108 (90 BASE) MCG/ACT inhaler Inhale 2 puffs into the lungs every 6 (six) hours as needed for wheezing or shortness of breath. 1 Inhaler 6  . albuterol (PROVENTIL) (2.5 MG/3ML) 0.083% nebulizer solution Take 3 mLs (2.5 mg total) by nebulization every 4 (four) hours as needed for wheezing or shortness of breath (dx J44.9). 360 vial 6  . aspirin EC 81 MG EC tablet Take 1 tablet (81 mg total) by mouth daily. 30 tablet 0  . esomeprazole (NEXIUM) 40 MG capsule Take 40 mg by mouth daily.    . fluticasone (FLONASE) 50 MCG/ACT nasal spray SHAKE WELL AND USE 2 SPRAYS IN EACH NOSTRIL DAILY 16 g 2  . Fluticasone Furoate-Vilanterol (BREO ELLIPTA) 100-25 MCG/INH AEPB Inhale 1 puff into the lungs daily. 1 each 6  . gabapentin (NEURONTIN) 300 MG capsule Take 300 mg by mouth 3 (three) times daily.      Marland Kitchen losartan (COZAAR) 100 MG tablet Take 1 tablet (100 mg total) by mouth daily. 30 tablet 5  . pentosan polysulfate (ELMIRON) 100 MG capsule Take 100 mg by mouth 3 (three) times daily before meals.     . Tamsulosin HCl (FLOMAX) 0.4 MG CAPS Take 0.4 mg by mouth daily.     Marland Kitchen venlafaxine XR (EFFEXOR-XR) 150 MG 24 hr capsule Take 1 capsule (150 mg total) by mouth daily. 30 capsule 3  . amLODipine (NORVASC) 10 MG tablet Take 10 mg by mouth daily.    . diazepam (VALIUM) 5 MG tablet Take 1 tablet (5 mg total) by mouth 2 (two) times daily. (Patient not taking: Reported on 08/23/2014) 10 tablet 0  . tiotropium (SPIRIVA HANDIHALER) 18 MCG inhalation capsule Place 1 capsule (18 mcg total) into inhaler and inhale once. (Patient not taking: Reported on 09/21/2014) 30 capsule 12   No current facility-administered medications for this visit.     Functional Status:  In your present state of health, do you have any difficulty performing the following activities: 09/21/2014 08/23/2014  Hearing? N N  Vision? N N  Difficulty concentrating or making decisions? N N  Walking or climbing stairs? N N  Dressing or bathing? N N  Doing errands, shopping? N N  Preparing Food and eating ? N N  Using the Toilet? N N  In the past six months, have you accidently leaked urine? N N  Do you have problems with loss of bowel control? N N  Managing your Medications? N N  Managing your Finances? N N  Housekeeping or managing your Housekeeping? N N    Fall/Depression Screening:  PHQ 2/9 Scores 09/21/2014 08/23/2014 07/23/2014 07/02/2014  PHQ - 2 Score 0 0 0 0    Assessment: Initial home visit to patient's home to complete health Care power of attorney form.  Document completed, however patient continues to  decline completion of living will.  Patient advised that  form would need to be notarized. Per patient,  he will go today to his bank to get the document notarized.  Patient lives alone, per patient he is able to take care of his own ADL's and IADL's.  Patient attends the [employer] program 5 days a week  and  supper programs 3 times a week at [employer].  Per patient he cooks his own meals when he does not attend one of the meal programs.  Patient states being fairly independent, utilizes the city bus to run errands or to get to doctor's appointments. However reports being able to  depend on his sister or brother if needed.  Per patient,  no further social work intervention needed at this time.  Plan: Patient case to be closed to social work.  This Education officer, museum will inform patient's nurse of case closure.

## 2014-09-24 ENCOUNTER — Other Ambulatory Visit: Payer: Self-pay | Admitting: Pharmacist

## 2014-09-24 NOTE — Patient Outreach (Signed)
[name] [facility]) Care Management  [place]   09/24/2014  Steven Beasley 02/22/1941 [id number]  Subjective: Steven Beasley is a 74 y.o. male who is being followed by [place] for medication management. I called the patient today to make sure that he had his albuterol inhaler. He reported that he was able to get the Ventolin inhaler and denies any other issues with his medications.   Objective:   Current Medications: Current Outpatient Prescriptions  Medication Sig Dispense Refill  . albuterol (PROVENTIL HFA;VENTOLIN HFA) 108 (90 BASE) MCG/ACT inhaler Inhale 2 puffs into the lungs every 6 (six) hours as needed for wheezing or shortness of breath. 1 Inhaler 6  . albuterol (PROVENTIL) (2.5 MG/3ML) 0.083% nebulizer solution Take 3 mLs (2.5 mg total) by nebulization every 4 (four) hours as needed for wheezing or shortness of breath (dx J44.9). 360 vial 6  . amLODipine (NORVASC) 10 MG tablet Take 10 mg by mouth daily.    Marland Kitchen aspirin EC 81 MG EC tablet Take 1 tablet (81 mg total) by mouth daily. 30 tablet 0  . diazepam (VALIUM) 5 MG tablet Take 1 tablet (5 mg total) by mouth 2 (two) times daily. (Patient not taking: Reported on 08/23/2014) 10 tablet 0  . esomeprazole (NEXIUM) 40 MG capsule Take 40 mg by mouth daily.    . fluticasone (FLONASE) 50 MCG/ACT nasal spray SHAKE WELL AND USE 2 SPRAYS IN EACH NOSTRIL DAILY 16 g 2  . Fluticasone Furoate-Vilanterol (BREO ELLIPTA) 100-25 MCG/INH AEPB Inhale 1 puff into the lungs daily. 1 each 6  . gabapentin (NEURONTIN) 300 MG capsule Take 300 mg by mouth 3 (three) times daily.      Marland Kitchen losartan (COZAAR) 100 MG tablet Take 1 tablet (100 mg total) by mouth daily. 30 tablet 5  . pentosan polysulfate (ELMIRON) 100 MG capsule Take 100 mg by mouth 3 (three) times daily before meals.     . Tamsulosin HCl (FLOMAX) 0.4 MG CAPS Take 0.4 mg by mouth daily.     Marland Kitchen tiotropium (SPIRIVA HANDIHALER) 18 MCG inhalation capsule Place 1 capsule (18 mcg  total) into inhaler and inhale once. 30 capsule 12  . venlafaxine XR (EFFEXOR-XR) 150 MG 24 hr capsule Take 1 capsule (150 mg total) by mouth daily. 30 capsule 3   No current facility-administered medications for this visit.    Functional Status: In your present state of health, do you have any difficulty performing the following activities: 09/21/2014 08/23/2014  Hearing? N N  Vision? N N  Difficulty concentrating or making decisions? N N  Walking or climbing stairs? N N  Dressing or bathing? N N  Doing errands, shopping? N N  Preparing Food and eating ? N N  Using the Toilet? N N  In the past six months, have you accidently leaked urine? N N  Do you have problems with loss of bowel control? N N  Managing your Medications? N N  Managing your Finances? N N  Housekeeping or managing your Housekeeping? N N    Fall/Depression Screening: PHQ 2/9 Scores 09/21/2014 08/23/2014 07/23/2014 07/02/2014  PHQ - 2 Score 0 0 0 0    Assessment: 1. Medication management: patient was able to get the Ventolin inhaler. No other issues with medications evident.   Plan: 1. Medication management: patient denies any issues with medications. Next follow up with Dr. Alease Frame on June 10th, 2016 at 2:30 pm.   Nicoletta Ba, PharmD, [place] Resident [place] [phone]

## 2014-09-26 ENCOUNTER — Encounter: Payer: Self-pay | Admitting: *Deleted

## 2014-09-28 ENCOUNTER — Encounter: Payer: Self-pay | Admitting: *Deleted

## 2014-09-28 ENCOUNTER — Ambulatory Visit (INDEPENDENT_AMBULATORY_CARE_PROVIDER_SITE_OTHER): Payer: Commercial Managed Care - HMO | Admitting: Family Medicine

## 2014-09-28 ENCOUNTER — Encounter: Payer: Self-pay | Admitting: Family Medicine

## 2014-09-28 VITALS — BP 136/83 | HR 96 | Ht 73.0 in | Wt 164.0 lb

## 2014-09-28 DIAGNOSIS — G252 Other specified forms of tremor: Secondary | ICD-10-CM | POA: Insufficient documentation

## 2014-09-28 DIAGNOSIS — R258 Other abnormal involuntary movements: Secondary | ICD-10-CM

## 2014-09-28 DIAGNOSIS — N529 Male erectile dysfunction, unspecified: Secondary | ICD-10-CM

## 2014-09-28 DIAGNOSIS — R259 Unspecified abnormal involuntary movements: Secondary | ICD-10-CM

## 2014-09-28 MED ORDER — TADALAFIL 5 MG PO TABS: 5.0000 mg | ORAL_TABLET | Freq: Every day | ORAL | Status: DC | PRN

## 2014-09-28 MED ORDER — AMLODIPINE BESYLATE 10 MG PO TABS
10.0000 mg | ORAL_TABLET | Freq: Every day | ORAL | Status: DC
Start: 2014-09-28 — End: 2014-12-21

## 2014-09-28 NOTE — Progress Notes (Signed)
   HPI  CC: Erectile dysfunction and tremor  Patient presents w/ c/o erectile dysfunction. He has had issues w/ maintaining an erection over the past couple months. He has no problems initiating the erection, only sustaining one. He is looking for a solution. Denies any numbness, weakness, dizziness, HA, confusion, syncope, SOB, CP. He has not had any urinary issues like dysuria, frequency, hesitancy, weak stream, dribbling, nocturia. He is still able to climax but only if he is able to sustain his erection (which has not been the case recently).   He is also inquiring about his tremor. His Rt arm has a resting tremor. It is not typically bothersome. He has had this for many years. He denies any other symptoms. No weakness, gait change, stiffness, fatigue, mood changes.  Review of Systems   See HPI for ROS. All other systems reviewed and are negative.  Past medical history and social history reviewed and updated in the EMR as appropriate.  Objective: BP 136/83 mmHg  Pulse 96  Ht 6\' 1"  (1.854 m)  Wt 164 lb (74.39 kg)  BMI 21.64 kg/m2 Gen: NAD, alert, cooperative with exam HEENT: NCAT, EOMI CV: RRR, no murmur Resp: CTABL, minimal end expir wheeze, non-labored Abd: SNTND, BS present, no guarding or organomegaly Ext: No edema, warm. Rt arm/hand tremor. Only at rest. Thenar atrophy noted at Rt hand.  Neuro: Alert and oriented, sensation and strength intact, finger to nose w/ very mild difficulties bilaterally. Rapid alternating movements also slightly affected. Otherwise no deficits.   Assessment and plan:  Erectile dysfunction Patient having difficulties maintaining an erection. C/w erectile dysfunction. Patient not currently on nitrites. No current contraindications w/ medical intervention.  - Cialis 5mg  #30 tabs provided - patient instructed to hold his dose of Flomax (if possible) when using Cialis that day.   Precepted w/ Dr. Mingo Amber  Resting tremor Rt hand resting tremor.  Patient reports this has been present for years. Rt Thenar Atrophy. Patient reports this too has been present for years. Unsure etiology at this time. Could be due to a spectrum of Parkinson's. Also an isolated cerebellar lesion may present this way. ALS can present this way but no true weakness noted. A Thoracic Outlet Syndrome may be the most likely explanation for the thenar wasting if the tremor and atrophy are not associated.  - patient not interested in Neuro referral at this time  - patient stated that if his sxs worsen then he'd like the referral - no w/u warranted at this time.     No orders of the defined types were placed in this encounter.    Meds ordered this encounter  Medications  . tadalafil (CIALIS) 5 MG tablet    Sig: Take 1 tablet (5 mg total) by mouth daily as needed for erectile dysfunction.    Dispense:  30 tablet    Refill:  0  . amLODipine (NORVASC) 10 MG tablet    Sig: Take 1 tablet (10 mg total) by mouth daily.    Dispense:  30 tablet    Refill:  [place], MD,MS,  PGY1 09/28/2014 7:10 PM

## 2014-09-28 NOTE — Assessment & Plan Note (Signed)
Rt hand resting tremor. Patient reports this has been present for years. Rt Thenar Atrophy. Patient reports this too has been present for years. Unsure etiology at this time. Could be due to a spectrum of Parkinson's. Also an isolated cerebellar lesion may present this way. ALS can present this way but no true weakness noted. A Thoracic Outlet Syndrome may be the most likely explanation for the thenar wasting if the tremor and atrophy are not associated.  - patient not interested in Neuro referral at this time  - patient stated that if his sxs worsen then he'd like the referral - no w/u warranted at this time.

## 2014-09-28 NOTE — Patient Instructions (Signed)
It was a pleasure seeing you today in our clinic. Today we discussed hand shaking and ED. Here is the treatment plan we have discussed and agreed upon together:  - I have written you a prescription for Cialis. This medication is very expensive so I recommend asking the pharmacy to only fill 2-5 tablets at a time. (they cost ~$10 per tablet) - Your hand shaking is something I would call a "resting tremor". Because this has been present for so long and it does not effect your daily life I do not believe this is something we need to worry about. With that said, if it worsens or you feel compelled to have this further worked up I would be happy to refer you to be seen by a Neurologist.

## 2014-09-28 NOTE — Assessment & Plan Note (Signed)
Patient having difficulties maintaining an erection. C/w erectile dysfunction. Patient not currently on nitrites. No current contraindications w/ medical intervention.  - Cialis 5mg  #30 tabs provided - patient instructed to hold his dose of Flomax (if possible) when using Cialis that day.   Precepted w/ Dr. Mingo Amber

## 2014-10-17 ENCOUNTER — Ambulatory Visit: Payer: Commercial Managed Care - HMO | Admitting: Cardiovascular Disease

## 2014-10-18 ENCOUNTER — Other Ambulatory Visit: Payer: Self-pay

## 2014-10-18 NOTE — Patient Outreach (Addendum)
[facility]) Care Management  [place]  10/18/2014   KOSTANTINOS TALLMAN 1941-01-17 [id number]     Subjective:   Health Coach telephonic assessment conducted today.  Patient reports he is doing well.  He was to see his cardiologist yesterday but stated he cancelled his appointment because he had something else to do.  He has purchased scales and is weighing himself daily and keeping a record.  Reports steady weights around 170 pounds, and states he has no edema.  Objective:   Current Medications:  Current Outpatient Prescriptions  Medication Sig Dispense Refill  . albuterol (PROVENTIL HFA;VENTOLIN HFA) 108 (90 BASE) MCG/ACT inhaler Inhale 2 puffs into the lungs every 6 (six) hours as needed for wheezing or shortness of breath. 1 Inhaler 6  . albuterol (PROVENTIL) (2.5 MG/3ML) 0.083% nebulizer solution Take 3 mLs (2.5 mg total) by nebulization every 4 (four) hours as needed for wheezing or shortness of breath (dx J44.9). 360 vial 6  . amLODipine (NORVASC) 10 MG tablet Take 1 tablet (10 mg total) by mouth daily. 30 tablet 5  . aspirin EC 81 MG EC tablet Take 1 tablet (81 mg total) by mouth daily. 30 tablet 0  . diazepam (VALIUM) 5 MG tablet Take 1 tablet (5 mg total) by mouth 2 (two) times daily. (Patient not taking: Reported on 08/23/2014) 10 tablet 0  . esomeprazole (NEXIUM) 40 MG capsule Take 40 mg by mouth daily.    . fluticasone (FLONASE) 50 MCG/ACT nasal spray SHAKE WELL AND USE 2 SPRAYS IN EACH NOSTRIL DAILY 16 g 2  . Fluticasone Furoate-Vilanterol (BREO ELLIPTA) 100-25 MCG/INH AEPB Inhale 1 puff into the lungs daily. 1 each 6  . gabapentin (NEURONTIN) 300 MG capsule Take 300 mg by mouth 3 (three) times daily.      Marland Kitchen losartan (COZAAR) 100 MG tablet Take 1 tablet (100 mg total) by mouth daily. 30 tablet 5  . pentosan polysulfate (ELMIRON) 100 MG capsule Take 100 mg by mouth 3 (three) times daily before meals.     . tadalafil (CIALIS) 5 MG tablet Take 1 tablet  (5 mg total) by mouth daily as needed for erectile dysfunction. 30 tablet 0  . Tamsulosin HCl (FLOMAX) 0.4 MG CAPS Take 0.4 mg by mouth daily.     Marland Kitchen tiotropium (SPIRIVA HANDIHALER) 18 MCG inhalation capsule Place 1 capsule (18 mcg total) into inhaler and inhale once. 30 capsule 12  . venlafaxine XR (EFFEXOR-XR) 150 MG 24 hr capsule Take 1 capsule (150 mg total) by mouth daily. 30 capsule 3   No current facility-administered medications for this visit.    Functional Status:  In your present state of health, do you have any difficulty performing the following activities: 09/21/2014 08/23/2014  Hearing? N N  Vision? N N  Difficulty concentrating or making decisions? N N  Walking or climbing stairs? N N  Dressing or bathing? N N  Doing errands, shopping? N N  Preparing Food and eating ? N N  Using the Toilet? N N  In the past six months, have you accidently leaked urine? N N  Do you have problems with loss of bowel control? N N  Managing your Medications? N N  Managing your Finances? N N  Housekeeping or managing your Housekeeping? N N    Fall/Depression Screening: PHQ 2/9 Scores 09/21/2014 08/23/2014 07/23/2014 07/02/2014  PHQ - 2 Score 0 0 0 0   THN CM Care Plan Problem One  Patient Outreach Telephone from 10/18/2014 in [place] Problem One  Knowledge deficit re CHF   Care Plan for Problem One  Active   THN Long Term Goal (31-90 days)  Patient states he wants to learn about CHF and be able to discuss and explain CHF Action Plan within 90 days.   THN Long Term Goal Start Date  08/23/14   Interventions for Problem One Long Term Goal  Education and support provided.  Pt states he is weighing daily.   THN CM Short Term Goal #1 (0-30 days)  Patient will weigh daily and keep a record of his weight.   THN CM Short Term Goal #1 Start Date  08/23/14   Interventions for Short Term Goal #1  Patient has been weighing daily ane keeping record.  Reinforced recommendation  to document.   THN CM Short Term Goal #2 (0-30 days)  Patient will be able to list 3 'Yellow Zone' symptoms of CHF within 30 days.   THN CM Short Term Goal #2 Start Date  08/23/14   [facility] CM Short Term Goal #2 Met Date  10/18/14   Interventions for Short Term Goal #2  Patient was able to recall 3 yellow zone symptoms.     Patient was able to discuss 3 symptoms of heart failure which would indicate a need to call his doctor, but                      did not keep scheduled visit with his cardiologist.   Assessment: Patient has increased knowledge of self-management of CHF, but will benefit from ongoing                         education and support.   Plan: Patient is to call cardiologist to reschedule appointment.           Telephonic assessment scheduled for next month.   Candie Mile, RN, MSN [place] [phone] Fax [phone]

## 2014-10-23 ENCOUNTER — Other Ambulatory Visit (HOSPITAL_COMMUNITY): Payer: Commercial Managed Care - HMO

## 2014-11-07 ENCOUNTER — Telehealth: Payer: Self-pay | Admitting: Family Medicine

## 2014-11-07 NOTE — Telephone Encounter (Signed)
Placed in MDs box to be filled out. Deseree Blount, CMA  

## 2014-11-07 NOTE — Telephone Encounter (Signed)
Patient requeas PCP to complete DMV Form. Please, follow up with Patient.

## 2014-11-08 NOTE — Telephone Encounter (Signed)
I need to see patient in our office before I fill out paperwork like this. I will keep paperwork in my inbox until I see him next.

## 2014-11-14 ENCOUNTER — Other Ambulatory Visit: Payer: Self-pay

## 2014-11-14 ENCOUNTER — Ambulatory Visit: Payer: Commercial Managed Care - HMO

## 2014-11-14 NOTE — Patient Outreach (Signed)
[name] [facility]) Care Management  11/14/2014  Steven Beasley [birth year] [id number]   Telephone call to patient for scheduled assessment.  Message left requesting call back. Will attempt to contact patient again within a week if he does not return my call.  Candie Mile, RN, MSN [place] [phone] Fax [phone]

## 2014-11-15 ENCOUNTER — Ambulatory Visit: Payer: Commercial Managed Care - HMO

## 2014-11-21 ENCOUNTER — Other Ambulatory Visit: Payer: Self-pay

## 2014-11-21 ENCOUNTER — Ambulatory Visit: Payer: Commercial Managed Care - HMO | Admitting: Adult Health

## 2014-11-21 NOTE — Patient Outreach (Signed)
[place] [facility]) Care Management  11/21/2014  Steven Beasley [birth year] [id number]   Attempted call to patient.  No answer.  Will attempt to contact patient within one week.  Candie Mile, RN, MSN [place] [phone] Fax [phone]

## 2014-11-26 ENCOUNTER — Ambulatory Visit: Payer: Commercial Managed Care - HMO | Admitting: Adult Health

## 2014-11-27 ENCOUNTER — Other Ambulatory Visit: Payer: Self-pay

## 2014-11-27 NOTE — Patient Outreach (Signed)
[place] [facility]) Care Management  11/27/2014  Steven Beasley 12-Dec-1940 [id number]   Another attempt this date to contact Mr. Fok.  HIPPA appropriate message left requesting he return my call.  Candie Mile, RN, MSN [place] [phone] Fax [phone]

## 2014-11-28 ENCOUNTER — Ambulatory Visit: Payer: Commercial Managed Care - HMO | Admitting: Physician Assistant

## 2014-11-29 NOTE — Telephone Encounter (Signed)
Attempted to call pt again. No answer. Please read message below if pt calls back. Lenward Able Kennon Holter, CMA

## 2014-12-04 ENCOUNTER — Other Ambulatory Visit: Payer: Self-pay | Admitting: Family Medicine

## 2014-12-12 ENCOUNTER — Ambulatory Visit: Payer: Commercial Managed Care - HMO | Admitting: Family Medicine

## 2014-12-17 ENCOUNTER — Other Ambulatory Visit: Payer: Self-pay | Admitting: Pulmonary Disease

## 2014-12-18 ENCOUNTER — Ambulatory Visit: Payer: Commercial Managed Care - HMO | Admitting: Cardiology

## 2014-12-21 ENCOUNTER — Encounter: Payer: Self-pay | Admitting: Cardiology

## 2014-12-21 ENCOUNTER — Ambulatory Visit (INDEPENDENT_AMBULATORY_CARE_PROVIDER_SITE_OTHER): Payer: Commercial Managed Care - HMO | Admitting: Cardiology

## 2014-12-21 VITALS — BP 122/80 | HR 113 | Ht 73.0 in | Wt 161.1 lb

## 2014-12-21 DIAGNOSIS — I5022 Chronic systolic (congestive) heart failure: Secondary | ICD-10-CM

## 2014-12-21 DIAGNOSIS — I429 Cardiomyopathy, unspecified: Secondary | ICD-10-CM

## 2014-12-21 DIAGNOSIS — I428 Other cardiomyopathies: Secondary | ICD-10-CM

## 2014-12-21 MED ORDER — AMLODIPINE BESYLATE 10 MG PO TABS: 10.0000 mg | ORAL_TABLET | Freq: Every day | ORAL | Status: DC

## 2014-12-21 MED ORDER — LOSARTAN POTASSIUM 100 MG PO TABS: 100.0000 mg | ORAL_TABLET | Freq: Every day | ORAL | Status: AC

## 2014-12-21 NOTE — Patient Instructions (Signed)
Medication Instructions:  Your physician recommends that you continue on your current medications as directed. Please refer to the Current Medication list given to you today.   Labwork: None ordered  Testing/Procedures: None orderecd  Follow-Up: Your physician recommends that you schedule a follow-up appointment in: [place]   Any Other Special Instructions Will Be Listed Below (If Applicable).  Low-Sodium Eating Plan Sodium raises blood pressure and causes water to be held in the body. Getting less sodium from food will help lower your blood pressure, reduce any swelling, and protect your heart, liver, and kidneys. We get sodium by adding salt (sodium chloride) to food. Most of our sodium comes from canned, boxed, and frozen foods. Restaurant foods, fast foods, and pizza are also very high in sodium. Even if you take medicine to lower your blood pressure or to reduce fluid in your body, getting less sodium from your food is important. WHAT IS MY PLAN? Most people should limit their sodium intake to 2,300 mg a day. Your health care provider recommends that you limit your sodium intake to __________ a day.  WHAT DO I NEED TO KNOW ABOUT THIS EATING PLAN? For the low-sodium eating plan, you will follow these general guidelines:  Choose foods with a % Daily Value for sodium of less than 5% (as listed on the food label).   Use salt-free seasonings or herbs instead of table salt or sea salt.   Check with your health care provider or pharmacist before using salt substitutes.   Eat fresh foods.  Eat more vegetables and fruits.  Limit canned vegetables. If you do use them, rinse them well to decrease the sodium.   Limit cheese to 1 oz (28 g) per day.   Eat lower-sodium products, often labeled as "lower sodium" or "no salt added."  Avoid foods that contain monosodium glutamate (MSG). MSG is sometimes added to [place] food and some canned  foods.  Check food labels (Nutrition Facts labels) on foods to learn how much sodium is in one serving.  Eat more home-cooked food and less restaurant, buffet, and fast food.  When eating at a restaurant, ask that your food be prepared with less salt or none, if possible.  HOW DO I READ FOOD LABELS FOR SODIUM INFORMATION? The Nutrition Facts label lists the amount of sodium in one serving of the food. If you eat more than one serving, you must multiply the listed amount of sodium by the number of servings. Food labels may also identify foods as:  Sodium free--Less than 5 mg in a serving.  Very low sodium--35 mg or less in a serving.  Low sodium--140 mg or less in a serving.  Light in sodium--50% less sodium in a serving. For example, if a food that usually has 300 mg of sodium is changed to become light in sodium, it will have 150 mg of sodium.  Reduced sodium--25% less sodium in a serving. For example, if a food that usually has 400 mg of sodium is changed to reduced sodium, it will have 300 mg of sodium. WHAT FOODS CAN I EAT? Grains Low-sodium cereals, including oats, puffed wheat and rice, and shredded wheat cereals. Low-sodium crackers. Unsalted rice and pasta. Lower-sodium bread.  Vegetables Frozen or fresh vegetables. Low-sodium or reduced-sodium canned vegetables. Low-sodium or reduced-sodium tomato sauce and paste. Low-sodium or reduced-sodium tomato and vegetable juices.  Fruits Fresh, frozen, and canned fruit. Fruit juice.  Meat and Other Protein Products Low-sodium canned  tuna and salmon. Fresh or frozen meat, poultry, seafood, and fish. Lamb. Unsalted nuts. Dried beans, peas, and lentils without added salt. Unsalted canned beans. Homemade soups without salt. Eggs.  Dairy Milk. Soy milk. Ricotta cheese. Low-sodium or reduced-sodium cheeses. Yogurt.  Condiments Fresh and dried herbs and spices. Salt-free seasonings. Onion and garlic powders. Low-sodium varieties of  mustard and ketchup. Lemon juice.  Fats and Oils Reduced-sodium salad dressings. Unsalted butter.  Other Unsalted popcorn and pretzels.  The items listed above may not be a complete list of recommended foods or beverages. Contact your dietitian for more options. WHAT FOODS ARE NOT RECOMMENDED? Grains Instant hot cereals. Bread stuffing, pancake, and biscuit mixes. Croutons. Seasoned rice or pasta mixes. Noodle soup cups. Boxed or frozen macaroni and cheese. Self-rising flour. Regular salted crackers. Vegetables Regular canned vegetables. Regular canned tomato sauce and paste. Regular tomato and vegetable juices. Frozen vegetables in sauces. Salted french fries. Olives. Angie Fava. Relishes. Sauerkraut. Salsa. Meat and Other Protein Products Salted, canned, smoked, spiced, or pickled meats, seafood, or fish. Bacon, ham, sausage, hot dogs, corned beef, chipped beef, and packaged luncheon meats. Salt pork. Jerky. Pickled herring. Anchovies, regular canned tuna, and sardines. Salted nuts. Dairy Processed cheese and cheese spreads. Cheese curds. Blue cheese and cottage cheese. Buttermilk.  Condiments Onion and garlic salt, seasoned salt, table salt, and sea salt. Canned and packaged gravies. Worcestershire sauce. Tartar sauce. Barbecue sauce. Teriyaki sauce. Soy sauce, including reduced sodium. Steak sauce. Fish sauce. Oyster sauce. Cocktail sauce. Horseradish. Regular ketchup and mustard. Meat flavorings and tenderizers. Bouillon cubes. Hot sauce. Tabasco sauce. Marinades. Taco seasonings. Relishes. Fats and Oils Regular salad dressings. Salted butter. Margarine. Ghee. Bacon fat.  Other Potato and tortilla chips. Corn chips and puffs. Salted popcorn and pretzels. Canned or dried soups. Pizza. Frozen entrees and pot pies.  The items listed above may not be a complete list of foods and beverages to avoid. Contact your dietitian for more information. Document Released: 09/26/2001 Document  Revised: 04/11/2013 Document Reviewed: 02/08/2013 [facility] Patient Information 2015 [place], [place]. This information is not intended to replace advice given to you by your health care provider. Make sure you discuss any questions you have with your health care provider.  Heart Failure Heart failure means your heart has trouble pumping blood. This makes it hard for your body to work well. Heart failure is usually a long-term (chronic) condition. You must take good care of yourself and follow your doctor's treatment plan. HOME CARE  Take your heart medicine as told by your doctor.  Do not stop taking medicine unless your doctor tells you to.  Do not skip any dose of medicine.  Refill your medicines before they run out.  Take other medicines only as told by your doctor or pharmacist.  Stay active if told by your doctor. The elderly and people with severe heart failure should talk with a doctor about physical activity.  Eat heart-healthy foods. Choose foods that are without trans fat and are low in saturated fat, cholesterol, and salt (sodium). This includes fresh or frozen fruits and vegetables, fish, lean meats, fat-free or low-fat dairy foods, whole grains, and high-fiber foods. Lentils and dried peas and beans (legumes) are also good choices.  Limit salt if told by your doctor.  Cook in a healthy way. Roast, grill, broil, bake, poach, steam, or stir-fry foods.  Limit fluids as told by your doctor.  Weigh yourself every morning. Do this after you pee (urinate) and before you eat breakfast. Write  down your weight to give to your doctor.  Take your blood pressure and write it down if your doctor tells you to.  Ask your doctor how to check your pulse. Check your pulse as told.  Lose weight if told by your doctor.  Stop smoking or chewing tobacco. Do not use gum or patches that help you quit without your doctor's approval.  Schedule and go to doctor visits as told.  Nonpregnant  women should have no more than 1 drink a day. Men should have no more than 2 drinks a day. Talk to your doctor about drinking alcohol.  Stop illegal drug use.  Stay current with shots (immunizations).  Manage your health conditions as told by your doctor.  Learn to manage your stress.  Rest when you are tired.  If it is really hot outside:  Avoid intense activities.  Use air conditioning or fans, or get in a cooler place.  Avoid caffeine and alcohol.  Wear loose-fitting, lightweight, and light-colored clothing.  If it is really cold outside:  Avoid intense activities.  Layer your clothing.  Wear mittens or gloves, a hat, and a scarf when going outside.  Avoid alcohol.  Learn about heart failure and get support as needed.  Get help to maintain or improve your quality of life and your ability to care for yourself as needed. GET HELP IF:   You gain 03 lb/1.4 kg or more in 1 day or 05 lb/2.3 kg in a week.  You are more short of breath than usual.  You cannot do your normal activities.  You tire easily.  You cough more than normal, especially with activity.  You have any or more puffiness (swelling) in areas such as your hands, feet, ankles, or belly (abdomen).  You cannot sleep because it is hard to breathe.  You feel like your heart is beating fast (palpitations).  You get dizzy or light-headed when you stand up. GET HELP RIGHT AWAY IF:   You have trouble breathing.  There is a change in mental status, such as becoming less alert or not being able to focus.  You have chest pain or discomfort.  You faint. MAKE SURE YOU:   Understand these instructions.  Will watch your condition.  Will get help right away if you are not doing well or get worse. Document Released: 01/14/2008 Document Revised: 08/21/2013 Document Reviewed: 05/23/2012 [facility] Patient Information 2015 [place], [place]. This information is not intended to replace advice given to you by  your health care provider. Make sure you discuss any questions you have with your health care provider.

## 2014-12-21 NOTE — Progress Notes (Signed)
12/21/2014 Steven Beasley   [birth year]  [id number]  Primary Physician Georges Lynch, MD Primary Cardiologist: Dr. Angelena Form   Reason for Visit/CC: Routien follow-up for chronic systolic heart failure  HPI:  The patient is a 74 year old male, followed by Dr. Angelena Form with a history of nonischemic cardiomyopathy, chronic systolic CHF EF 10-25% on echo 07/2014 , hypertension, COPD, BPH and GERD.  He was admitted to [facility] 07/2014 for COPD exacerbation and acute systolic heart failure. 2-D echo showed depressed ejection fraction 30-35%. Subsequent right and left heart catheterization was performed which showed elevated right heart pressures but nonobstructive CAD. He was placed on ARB  therapy with losartan. He cannot tolerate addition of beta blockers due to significant wheezing/COPD. also not been able to tolerate Lasix due to hyponatremia. There was a question of SIADH. Despite this, his volume status has remained well-controlled.   He presents to clinic today for follow-up. He reports that he has done well since his last office visit. He denies any issues with dyspnea, orthopnea, PND, lower extremity edema, weight gain/fluid retention. He is actually 10 pounds under his hospital dry weight. Weight today is 161 pounds. Issue to lack of acute heart failure symptoms, he denies any chest pain, palpitations, lightheadedness, dizziness, syncope/near-syncope. He reports full medication compliance.    Current Outpatient Prescriptions  Medication Sig Dispense Refill  . albuterol (PROVENTIL HFA;VENTOLIN HFA) 108 (90 BASE) MCG/ACT inhaler Inhale 2 puffs into the lungs every 6 (six) hours as needed for wheezing or shortness of breath. 1 Inhaler 6  . albuterol (PROVENTIL) (2.5 MG/3ML) 0.083% nebulizer solution Take 3 mLs (2.5 mg total) by nebulization every 4 (four) hours as needed for wheezing or shortness of breath (dx J44.9). 360 vial 6  . amLODipine (NORVASC) 10 MG tablet Take 1 tablet (10 mg  total) by mouth daily. 30 tablet 5  . aspirin EC 81 MG EC tablet Take 1 tablet (81 mg total) by mouth daily. 30 tablet 0  . esomeprazole (NEXIUM) 40 MG capsule TAKE 1 CAPSULE BY MOUTH EVERY MORNING BEFORE A MEAL 90 capsule 0  . fluticasone (FLONASE) 50 MCG/ACT nasal spray SHAKE WELL AND USE 2 SPRAYS IN EACH NOSTRIL DAILY 16 g 2  . gabapentin (NEURONTIN) 300 MG capsule Take 300 mg by mouth 3 (three) times daily.      Marland Kitchen losartan (COZAAR) 100 MG tablet Take 1 tablet (100 mg total) by mouth daily. 30 tablet 5  . pentosan polysulfate (ELMIRON) 100 MG capsule Take 100 mg by mouth 3 (three) times daily before meals.     . Tamsulosin HCl (FLOMAX) 0.4 MG CAPS Take 0.4 mg by mouth daily.     Marland Kitchen venlafaxine XR (EFFEXOR-XR) 150 MG 24 hr capsule Take 1 capsule (150 mg total) by mouth daily. 30 capsule 3   No current facility-administered medications for this visit.    No Known Allergies  Social History   Social History  . Marital Status: Single    Spouse Name: N/A  . Number of Children: N/A  . Years of Education: N/A   Occupational History  . Retired    Social History Main Topics  . Smoking status: Former Smoker -- 0.00 packs/day for 0 years    Quit date: 11/08/2009  . Smokeless tobacco: Never Used  . Alcohol Use: 3.6 oz/week    6 Cans of beer per week     Comment: ocasional on weekends  . Drug Use: No  . Sexual Activity: No   Other Topics  Concern  . Not on file   Social History Narrative   5 Children     Review of Systems: General: negative for chills, fever, night sweats or weight changes.  Cardiovascular: negative for chest pain, dyspnea on exertion, edema, orthopnea, palpitations, paroxysmal nocturnal dyspnea or shortness of breath Dermatological: negative for rash Respiratory: negative for cough or wheezing Urologic: negative for hematuria Abdominal: negative for nausea, vomiting, diarrhea, bright red blood per rectum, melena, or hematemesis Neurologic: negative for visual  changes, syncope, or dizziness All other systems reviewed and are otherwise negative except as noted above.    Blood pressure 122/80, pulse 113, height 6\' 1"  (1.854 m), weight 161 lb 1.9 oz (73.084 kg), SpO2 98 %.  General appearance: alert, cooperative and no distress Neck: no carotid bruit and no JVD Lungs: clear to auscultation bilaterally Heart: regular rate and rhythm, S1, S2 normal, no murmur, click, rub or gallop Extremities: no LEE Pulses: 2+ and symmetric Skin: warm and dry Neurologic: Grossly normal  EKG Not performed  ASSESSMENT AND PLAN:   1. Nonischemic cardiomyopathy/chronic systolic heart failure: EF 30-35%. No CAD on recent cath. Patient is euvolemic on physical exam without signs of fluid retention. No symptoms of acute CHF including no dyspnea. Continue ARB therapy with losartan. Due to severe COPD/wheezing he is not a candidate for beta blocker therapy. Blood pressure is well-controlled at 122/80.  Hold addition of Bidil to avoid hypotension. Continue to hold diuretics due to hyponatremia. Patient advised to call our office if any signs of fluid retention/ dyspnea. F/u with Dr. Angelena Form in 6 weeks who will determine timing of f/u 2D echo.     Lyda Jester PA-C 12/21/2014 2:44 PM

## 2014-12-23 ENCOUNTER — Other Ambulatory Visit: Payer: Self-pay | Admitting: Family Medicine

## 2014-12-25 ENCOUNTER — Ambulatory Visit: Payer: Commercial Managed Care - HMO | Admitting: Adult Health

## 2015-01-01 ENCOUNTER — Encounter: Payer: Self-pay | Admitting: Family Medicine

## 2015-01-01 ENCOUNTER — Ambulatory Visit (INDEPENDENT_AMBULATORY_CARE_PROVIDER_SITE_OTHER): Payer: Commercial Managed Care - HMO | Admitting: Family Medicine

## 2015-01-01 VITALS — BP 112/77 | HR 82 | Temp 98.3°F | Ht 72.0 in | Wt 159.5 lb

## 2015-01-01 DIAGNOSIS — N529 Male erectile dysfunction, unspecified: Secondary | ICD-10-CM

## 2015-01-01 DIAGNOSIS — R350 Frequency of micturition: Secondary | ICD-10-CM | POA: Diagnosis not present

## 2015-01-01 DIAGNOSIS — M25569 Pain in unspecified knee: Secondary | ICD-10-CM

## 2015-01-01 MED ORDER — TADALAFIL 20 MG PO TABS: 10.0000 mg | ORAL_TABLET | ORAL | Status: AC | PRN

## 2015-01-01 NOTE — Assessment & Plan Note (Signed)
Patient states that he occasionally has generalized knee pain with prolonged ambulation. No exam was performed on these extremities today. This occasional pain was reported only after I informed patient that a handicapped sticker required some sort of disability.  - I filled out patient's handicap forms today for a temporary pass of 6 months in time. I will discuss this with him when this expires as I do not plan on extending the period of this tag.

## 2015-01-01 NOTE — Progress Notes (Signed)
UX:NATFTDD states there was an error last time a medication was prescribed and he was not able to receive it. He also would like to have DMV handicap form completed. Eyes:Grey ring around pupils bilat., patient states there is no problem focusing or any loss in peripheral vision, states he has had no recent issues with sight. Hearing:Diminished in the left ear, normal in right ear. Breath sounds:equal rise and fall bilat., with normal lung sounds in all lobes. Heart sounds:normal Pulses:+2 for radial bilat.  For vitals see vitals flowsheet

## 2015-01-01 NOTE — Progress Notes (Deleted)
Eyes: *** Hearing: *** Breath sounds: *** Heart sounds: *** Abdomen: *** Pulses: *** CC: Vitals:  See vital sign flowsheet

## 2015-01-01 NOTE — Patient Instructions (Signed)
It was a pleasure seeing you today in our clinic. Today we discussed your erectile dysfunction, urinary frequency, and some arthritic pain. Here is the treatment plan we have discussed and agreed upon together:   - I have reordered the Cialis prescription today. This has been sent to your pharmacy. - I have sent a referral to Urology.

## 2015-01-01 NOTE — Assessment & Plan Note (Signed)
Patient continues to have complaints of erectile dysfunction. Associated symptoms include urinary hesitancy and frequency. He denies any dysuria or hematuria. Patient is insisting on urologist referral. He states that he has seen a urologist within the past year but was asked to get a referral. Typically I would not make this referral with this patient's history/symptoms however if he has already established with a urologist I will make this referral. - Referral to urology - Prescription for Cialis >> I informed patient of need to withhold Flomax dosing any day he plans on taking Cialis. I also informed the patient that this prescription will be expensive and he does not have to fill this prescription.

## 2015-01-01 NOTE — Progress Notes (Signed)
HPI  CC: erectile dysfunction and DMV forms. Patient is here w/ into erectile dysfunction. Had a previous visit he had been prescribed Cialis for this issue with the knowledge that his insurance would likely not cover this. Today he states that he was never able to fill this as he had lost the prescription and he would like another prescription for this medication. He continues to have some symptoms of urinary hesitancy and frequency. No dysuria, lightheadedness, dizziness, confusion, headaches, chest pain, shortness of breath.  Patient also like me to fill out paperwork to receive a permanent handicap license plate. I inquired about the reasoning behind this. He states that he is having a hard time finding a place for parking and that he does not want to walk long distances. I discussed with him the matter in which he is likely benefiting from walking longer distances specifically at his age while he still has no symptoms of concern with this activity. Patient stated his understanding that persisted to ask for such pass. He did report some occasional pain in his knees with prolonged ambulation.  Review of Systems   See HPI for ROS. All other systems reviewed and are negative.  Objective: BP 112/77 mmHg  Pulse 82  Temp(Src) 98.3 F (36.8 C)  Ht 6' (1.829 m)  Wt 159 lb 8 oz (72.349 kg)  BMI 21.63 kg/m2 Gen: NAD, alert, cooperative with exam HEENT: NCAT, EOMI CV: RRR, no murmur Resp: CTABL, minimal end expir wheeze, non-labored Ext: No edema, warm. Rt arm/hand tremor. Only at rest  Neuro: Alert and oriented, sensation and strength intact, finger to nose w/ very mild difficulties bilaterally. Rapid alternating movements also slightly affected. Otherwise no deficits.   Assessment and plan:  Erectile dysfunction Patient continues to have complaints of erectile dysfunction. Associated symptoms include urinary hesitancy and frequency. He denies any dysuria or hematuria. Patient is insisting  on urologist referral. He states that he has seen a urologist within the past year but was asked to get a referral. Typically I would not make this referral with this patient's history/symptoms however if he has already established with a urologist I will make this referral. - Referral to urology - Prescription for Cialis >> I informed patient of need to withhold Flomax dosing any day he plans on taking Cialis. I also informed the patient that this prescription will be expensive and he does not have to fill this prescription.  Knee pain Patient states that he occasionally has generalized knee pain with prolonged ambulation. No exam was performed on these extremities today. This occasional pain was reported only after I informed patient that a handicapped sticker required some sort of disability.  - I filled out patient's handicap forms today for a temporary pass of 6 months in time. I will discuss this with him when this expires as I do not plan on extending the period of this tag.    Orders Placed This Encounter  Procedures  . Ambulatory referral to Urology    Referral Priority:  Routine    Referral Type:  Consultation    Referral Reason:  Specialty Services Required    Referred to Provider:  Carolan Clines, MD    Requested Specialty:  Urology    Number of Visits Requested:  1    Meds ordered this encounter  Medications  . tadalafil (CIALIS) 20 MG tablet    Sig: Take 0.5-1 tablets (10-20 mg total) by mouth every other day as needed for erectile dysfunction.  Dispense:  5 tablet    Refill:  11     Elberta Leatherwood, MD,MS,  PGY2 01/01/2015 5:31 PM

## 2015-01-12 ENCOUNTER — Encounter (HOSPITAL_COMMUNITY): Payer: Self-pay | Admitting: *Deleted

## 2015-01-12 ENCOUNTER — Emergency Department (HOSPITAL_COMMUNITY): Payer: Commercial Managed Care - HMO

## 2015-01-12 ENCOUNTER — Observation Stay (HOSPITAL_COMMUNITY)
Admission: EM | Admit: 2015-01-12 | Discharge: 2015-01-13 | Disposition: A | Discharge status: Home / Self Care | Payer: Commercial Managed Care - HMO | Attending: Family Medicine | Admitting: Family Medicine

## 2015-01-12 DIAGNOSIS — J441 Chronic obstructive pulmonary disease with (acute) exacerbation: Secondary | ICD-10-CM | POA: Diagnosis not present

## 2015-01-12 DIAGNOSIS — I509 Heart failure, unspecified: Secondary | ICD-10-CM

## 2015-01-12 DIAGNOSIS — E871 Hypo-osmolality and hyponatremia: Secondary | ICD-10-CM | POA: Diagnosis not present

## 2015-01-12 DIAGNOSIS — Z7951 Long term (current) use of inhaled steroids: Secondary | ICD-10-CM | POA: Insufficient documentation

## 2015-01-12 DIAGNOSIS — I10 Essential (primary) hypertension: Secondary | ICD-10-CM | POA: Diagnosis not present

## 2015-01-12 DIAGNOSIS — J449 Chronic obstructive pulmonary disease, unspecified: Secondary | ICD-10-CM | POA: Diagnosis not present

## 2015-01-12 DIAGNOSIS — Z79899 Other long term (current) drug therapy: Secondary | ICD-10-CM | POA: Diagnosis not present

## 2015-01-12 DIAGNOSIS — Z7982 Long term (current) use of aspirin: Secondary | ICD-10-CM | POA: Diagnosis not present

## 2015-01-12 DIAGNOSIS — K219 Gastro-esophageal reflux disease without esophagitis: Secondary | ICD-10-CM | POA: Diagnosis not present

## 2015-01-12 DIAGNOSIS — N400 Enlarged prostate without lower urinary tract symptoms: Secondary | ICD-10-CM | POA: Insufficient documentation

## 2015-01-12 DIAGNOSIS — R0602 Shortness of breath: Secondary | ICD-10-CM | POA: Diagnosis present

## 2015-01-12 DIAGNOSIS — I5023 Acute on chronic systolic (congestive) heart failure: Secondary | ICD-10-CM | POA: Insufficient documentation

## 2015-01-12 DIAGNOSIS — Z87891 Personal history of nicotine dependence: Secondary | ICD-10-CM | POA: Insufficient documentation

## 2015-01-12 DIAGNOSIS — D649 Anemia, unspecified: Secondary | ICD-10-CM | POA: Diagnosis not present

## 2015-01-12 DIAGNOSIS — D72819 Decreased white blood cell count, unspecified: Secondary | ICD-10-CM | POA: Insufficient documentation

## 2015-01-12 LAB — CBC WITH DIFFERENTIAL/PLATELET
BASOS ABS: 0 10*3/uL (ref 0.0–0.1)
Basophils Relative: 0 %
Eosinophils Absolute: 0.1 10*3/uL (ref 0.0–0.7)
Eosinophils Relative: 1 %
HEMATOCRIT: 32.6 % — AB (ref 39.0–52.0)
HEMOGLOBIN: 10.8 g/dL — AB (ref 13.0–17.0)
LYMPHS PCT: 33 %
Lymphs Abs: 2.7 10*3/uL (ref 0.7–4.0)
MCH: 31.8 pg (ref 26.0–34.0)
MCHC: 33.1 g/dL (ref 30.0–36.0)
MCV: 95.9 fL (ref 78.0–100.0)
MONO ABS: 0.9 10*3/uL (ref 0.1–1.0)
Monocytes Relative: 11 %
NEUTROS ABS: 4.4 10*3/uL (ref 1.7–7.7)
NEUTROS PCT: 55 %
Platelets: 206 10*3/uL (ref 150–400)
RBC: 3.4 MIL/uL — ABNORMAL LOW (ref 4.22–5.81)
RDW: 13 % (ref 11.5–15.5)
WBC: 8.1 10*3/uL (ref 4.0–10.5)

## 2015-01-12 LAB — COMPREHENSIVE METABOLIC PANEL
ALK PHOS: 62 U/L (ref 38–126)
ALT: 13 U/L — AB (ref 17–63)
AST: 28 U/L (ref 15–41)
Albumin: 4.1 g/dL (ref 3.5–5.0)
Anion gap: 8 (ref 5–15)
BILIRUBIN TOTAL: 0.5 mg/dL (ref 0.3–1.2)
BUN: 5 mg/dL — AB (ref 6–20)
CALCIUM: 8.9 mg/dL (ref 8.9–10.3)
CO2: 32 mmol/L (ref 22–32)
CREATININE: 0.87 mg/dL (ref 0.61–1.24)
Chloride: 92 mmol/L — ABNORMAL LOW (ref 101–111)
GFR calc Af Amer: >60 mL/min (ref >60)
Glucose, Bld: 103 mg/dL — ABNORMAL HIGH (ref 65–99)
POTASSIUM: 4 mmol/L (ref 3.5–5.1)
Sodium: 132 mmol/L — ABNORMAL LOW (ref 135–145)
TOTAL PROTEIN: 7.5 g/dL (ref 6.5–8.1)

## 2015-01-12 LAB — BRAIN NATRIURETIC PEPTIDE: B NATRIURETIC PEPTIDE 5: 710.9 pg/mL — AB (ref 0.0–100.0)

## 2015-01-12 LAB — TROPONIN I: Troponin I: <0.03 ng/mL (ref <0.031)

## 2015-01-12 MED ORDER — IPRATROPIUM-ALBUTEROL 0.5-2.5 (3) MG/3ML IN SOLN
3.0000 mL | RESPIRATORY_TRACT | Status: DC
  Administered 2015-01-12 – 2015-01-13 (×6): 3 mL via RESPIRATORY_TRACT
  Filled 2015-01-12 (×5): qty 3

## 2015-01-12 MED ORDER — GABAPENTIN 300 MG PO CAPS
300.0000 mg | ORAL_CAPSULE | Freq: Three times a day (TID) | ORAL | Status: DC
  Administered 2015-01-12 – 2015-01-13 (×5): 300 mg via ORAL
  Filled 2015-01-12 (×5): qty 1

## 2015-01-12 MED ORDER — VENLAFAXINE HCL ER 150 MG PO CP24
150.0000 mg | ORAL_CAPSULE | Freq: Every day | ORAL | Status: DC
  Administered 2015-01-12 – 2015-01-13 (×2): 150 mg via ORAL
  Filled 2015-01-12 (×3): qty 1

## 2015-01-12 MED ORDER — SODIUM CHLORIDE 0.9 % IJ SOLN
3.0000 mL | Freq: Two times a day (BID) | INTRAMUSCULAR | Status: DC
  Administered 2015-01-12 (×2): 3 mL via INTRAVENOUS

## 2015-01-12 MED ORDER — DEXTROSE 5 % IV SOLN
500.0000 mg | Freq: Once | INTRAVENOUS | Status: DC
  Filled 2015-01-12: qty 500

## 2015-01-12 MED ORDER — HEPARIN SODIUM (PORCINE) 5000 UNIT/ML IJ SOLN
5000.0000 [IU] | Freq: Three times a day (TID) | INTRAMUSCULAR | Status: DC
  Administered 2015-01-12 – 2015-01-13 (×4): 5000 [IU] via SUBCUTANEOUS
  Filled 2015-01-12 (×3): qty 1

## 2015-01-12 MED ORDER — SODIUM CHLORIDE 0.9 % IV SOLN: 250.0000 mL | INTRAVENOUS | Status: DC | PRN

## 2015-01-12 MED ORDER — PANTOPRAZOLE SODIUM 40 MG PO TBEC
80.0000 mg | DELAYED_RELEASE_TABLET | Freq: Every day | ORAL | Status: DC
  Administered 2015-01-12 – 2015-01-13 (×2): 80 mg via ORAL
  Filled 2015-01-12 (×2): qty 2

## 2015-01-12 MED ORDER — METHYLPREDNISOLONE SODIUM SUCC 125 MG IJ SOLR
125.0000 mg | Freq: Once | INTRAMUSCULAR | Status: AC
  Administered 2015-01-12: 125 mg via INTRAVENOUS
  Filled 2015-01-12: qty 2

## 2015-01-12 MED ORDER — FUROSEMIDE 10 MG/ML IJ SOLN
40.0000 mg | Freq: Once | INTRAMUSCULAR | Status: AC
  Administered 2015-01-12: 40 mg via INTRAVENOUS
  Filled 2015-01-12: qty 4

## 2015-01-12 MED ORDER — LOSARTAN POTASSIUM 50 MG PO TABS
100.0000 mg | ORAL_TABLET | Freq: Every day | ORAL | Status: DC
  Administered 2015-01-12 – 2015-01-13 (×2): 100 mg via ORAL
  Filled 2015-01-12 (×2): qty 2

## 2015-01-12 MED ORDER — PREDNISONE 50 MG PO TABS
50.0000 mg | ORAL_TABLET | Freq: Every day | ORAL | Status: DC
  Administered 2015-01-13: 50 mg via ORAL
  Filled 2015-01-12 (×2): qty 1

## 2015-01-12 MED ORDER — ASPIRIN EC 81 MG PO TBEC
81.0000 mg | DELAYED_RELEASE_TABLET | Freq: Every day | ORAL | Status: DC
  Administered 2015-01-12 – 2015-01-13 (×2): 81 mg via ORAL
  Filled 2015-01-12 (×2): qty 1

## 2015-01-12 MED ORDER — DOXYCYCLINE HYCLATE 100 MG IV SOLR
100.0000 mg | Freq: Two times a day (BID) | INTRAVENOUS | Status: AC
  Administered 2015-01-12 (×2): 100 mg via INTRAVENOUS
  Filled 2015-01-12 (×2): qty 100

## 2015-01-12 MED ORDER — METHYLPREDNISOLONE SODIUM SUCC 125 MG IJ SOLR
125.0000 mg | Freq: Once | INTRAMUSCULAR | Status: DC
  Filled 2015-01-12: qty 2

## 2015-01-12 MED ORDER — SODIUM CHLORIDE 0.9 % IJ SOLN: 3.0000 mL | INTRAMUSCULAR | Status: DC | PRN

## 2015-01-12 MED ORDER — IPRATROPIUM-ALBUTEROL 0.5-2.5 (3) MG/3ML IN SOLN: 3.0000 mL | RESPIRATORY_TRACT | Status: DC | PRN

## 2015-01-12 MED ORDER — SODIUM CHLORIDE 0.9 % IJ SOLN
3.0000 mL | Freq: Two times a day (BID) | INTRAMUSCULAR | Status: DC
  Administered 2015-01-12 – 2015-01-13 (×3): 3 mL via INTRAVENOUS

## 2015-01-12 MED ORDER — FUROSEMIDE 40 MG PO TABS
40.0000 mg | ORAL_TABLET | Freq: Every day | ORAL | Status: AC
  Administered 2015-01-12: 40 mg via ORAL
  Filled 2015-01-12: qty 1

## 2015-01-12 MED ORDER — AMLODIPINE BESYLATE 10 MG PO TABS
10.0000 mg | ORAL_TABLET | Freq: Every day | ORAL | Status: DC
  Administered 2015-01-12 – 2015-01-13 (×2): 10 mg via ORAL
  Filled 2015-01-12 (×2): qty 1

## 2015-01-12 MED ORDER — ALBUTEROL (5 MG/ML) CONTINUOUS INHALATION SOLN
10.0000 mg/h | INHALATION_SOLUTION | Freq: Once | RESPIRATORY_TRACT | Status: AC
  Administered 2015-01-12: 10 mg/h via RESPIRATORY_TRACT
  Filled 2015-01-12: qty 20

## 2015-01-12 MED ORDER — DOXYCYCLINE HYCLATE 100 MG PO TABS
100.0000 mg | ORAL_TABLET | Freq: Two times a day (BID) | ORAL | Status: DC
  Administered 2015-01-13: 100 mg via ORAL
  Filled 2015-01-12 (×2): qty 1

## 2015-01-12 MED ORDER — POTASSIUM CHLORIDE CRYS ER 20 MEQ PO TBCR
40.0000 meq | EXTENDED_RELEASE_TABLET | Freq: Once | ORAL | Status: AC
  Administered 2015-01-12: 40 meq via ORAL
  Filled 2015-01-12: qty 2

## 2015-01-12 MED ORDER — AZITHROMYCIN 500 MG PO TABS: 250.0000 mg | ORAL_TABLET | Freq: Every day | ORAL | Status: DC

## 2015-01-12 MED ORDER — TAMSULOSIN HCL 0.4 MG PO CAPS
0.4000 mg | ORAL_CAPSULE | Freq: Every day | ORAL | Status: DC
  Administered 2015-01-12 – 2015-01-13 (×2): 0.4 mg via ORAL
  Filled 2015-01-12 (×2): qty 1

## 2015-01-12 NOTE — ED Notes (Signed)
Plan to change bed to med surg. 2H notified

## 2015-01-12 NOTE — Progress Notes (Signed)
New Admission Note:   Arrival Method: Stretcher  Mental Orientation: Alert and Oriented  Telemetry: Box 6e-1  Assessment: Completed Skin: See doc flowsheets  IV: See doc flowsheets Pain: Patient denies  Tubes: 02 at 2L  Safety Measures: Safety Fall Prevention Plan has been given, discussed and signed Admission: Completed [address] Orientation: Patient has been orientated to the room, unit and staff.  Family: None at bedside   Orders have been reviewed and implemented. Will continue to monitor the patient. Call light has been placed within reach and bed alarm has been activated.   Alcide Evener BSN, RN Phone number: [phone]

## 2015-01-12 NOTE — Progress Notes (Signed)
Pt comes in via EMS on NIV arrival. Pt states that he is short of breath. BiPAP is initiated per MD verbal order. Settings in flow sheet. On assessment, SOB, diaphoretic, diminished breath sounds. o2 sats are stable. RT will continue to monitor

## 2015-01-12 NOTE — ED Notes (Signed)
Pt arrives via EMS from home, diaphoretic, extremely SHOB, minimal lung sounds. 15 albuterol, 1mg  atrovent given PTA. Nebulized 1mg  EPI, 125 mg solumedrol, 2mg  mag sulf given PTA.

## 2015-01-12 NOTE — ED Notes (Signed)
Trying pt off the bipap at this time, tolerating well on 4L [place]

## 2015-01-12 NOTE — H&P (Signed)
[facility] Admission History and Physical Service Pager: [phone]  Patient name: Steven Beasley Medical record number: [id number] Date of birth: 03-Oct-1940 Age: 74 y.o. Gender: male  Primary Care Tamika Nou: Georges Lynch, MD Consultants: None Code Status: FULL  Chief Complaint: Shortness of Breath  Assessment and Plan: Steven Beasley is a 74 y.o. male presenting with shortness of breath, likely CHF exacerbation vs COPD exacerbation. PMH is significant for COPD, CHF (EF 30-35%), HTN  Shortness of breath: Likely COPD exacerbation, given his rhonchi on exam. Could also be caused by CHF exacerbation, given his BNP of 710 and crackles in lung bases on exam; however, he does not have any lower extremity edema and his CXR is not consistent with pulmonary edema. He is also not having any orthopnea or PND. Another differential is pneumonia, but he is afebrile with a normal WBC count. CXR shows hazy infiltrate within the peripheral right upper lobe, similar to previous xray from 06/2014. MI was also considered, but his troponin is negative x 1 and EKG was unchanged from previous study. Currently on new O2 requirement of 4L, satting 95-100%. No baseline oxygen requirement reported. Received Lasix 40mg  IV x 1 in the ED.  - Consider additional lasix dose if breathing does not improve. - Scheduled duonebs every 4 hours, with PRN duonebs every 2 hours - Solumedrol today and then transition to Prednisone on 9/25 for COPD exacerbation - Doxycyline 100mg  IV today, then transition to PO Doxycycline on 9/25 to complete 5 day course - Cardiac monitoring - Daily weights - Strict I's & O's - Wean O2 as tolerated to keep O2 sat above 90%  HFrEF: Last ECHO (06/2014): EF 30-35%, possible moderate hypokinesis of the inferior myocardium, LA and RA mildly dilated, PA peak pressure 46 mmHg. Does not appear fluid overloaded today, without edema. Slight crackles were noted in lung bases  bilaterally. Received Lasix 40mg  IV x1 in ED - Continue home med: Cozaar 100mg  qd - Consider additional lasix doses if no improvement - Daily weights. Baseline weight  159lb at last office visit on 9/13. No weights reported this hospitalization for comparison.  HTN- Stable - Continue home meds: Norvasc 10mg  qd, Cozaar 100mg  qd  GERD - Protonix 80mg  qd  BPH: - Continue home med: Flomax 0.4mg  qd  Prolonged QTc: QTc noted to be prolonged to 521 on EKG  - Caution with medications known to prolong QT.  - Follow up am EKG  FEN/GI: Saline lock IV, Heart Healthy diet Prophylaxis: Heparin sq  Disposition: Home likely tomorrow after transition to PO medication and wean to room air  History of Present Illness:  Steven Beasley is a 74 y.o. male presenting with shortness of breath that started yesterday. The SOB was gradual in onset and he first noticed it while sitting up on the edge of the bed. He tried Albuterol, but it didn't help. He has had a cough productive of white sputum for years, and has not noticed an increase in cough or sputum production. He says that this episode feels like a COPD exacerbation. He states that his weight hasn't changed over the last couple of days. He denies any dyspnea on exertion, orthopnea, or paroxysmal nocturnal dyspnea. Denies any worsening shortness of breath with exertion over last several days. He denies any fevers, chest pain, diaphoresis, or cold symptoms. He is feeling better after his breathing treatment in the ED and says he has no shortness of breath right now. He does not use  any O2 at home at baseline and has no trouble getting around the house and taking care of himself.  In the ED, he came in on CPAP. He was given Lasix 40mg  IV x 1 and he was given an hour long nebulizer through BiPAP. He was able to be transitioned to 4L O2 by Ramona early this morning.  Review Of Systems: Per HPI with the following additions: None Otherwise 12 point review of systems  was performed and was unremarkable.  Patient Active Problem List   Diagnosis Date Noted  . Knee pain 01/01/2015  . Erectile dysfunction 09/28/2014  . Resting tremor 09/28/2014  . Involuntary movements on examination 07/20/2014  . Hyponatremia   . Chronic systolic heart failure   . COPD exacerbation 07/07/2014  . COPD (chronic obstructive pulmonary disease) 12/13/2012  . 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 Leukocytopenia   . Abdominal pain, chronic, generalized   . GERD (gastroesophageal reflux disease)   . BPH (benign prostatic hyperplasia)   . UNDERWEIGHT 10/01/2009   Past Medical History: Past Medical History  Diagnosis Date  . Hypertension   . Leukocytopenia   . Asthma   . Abdominal pain, chronic, generalized   . GERD (gastroesophageal reflux disease)   . BPH (benign prostatic hyperplasia)   . 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 Shortness of breath    Past Surgical History: Past Surgical History  Procedure Laterality Date  . Video bronchoscopy Bilateral 10/28/2012    Procedure: VIDEO BRONCHOSCOPY WITHOUT FLUORO;  Surgeon: Rigoberto Noel, MD;  Location: [facility];  Service: Cardiopulmonary;  Laterality: Bilateral;  . Penile prosthesis implant  1997  . Left and right heart catheterization with coronary angiogram N/A 07/12/2014    Procedure: LEFT AND RIGHT HEART CATHETERIZATION WITH CORONARY ANGIOGRAM;  Surgeon: Burnell Blanks, MD;  Location: [facility] CATH LAB;  Service: Cardiovascular;  Laterality: N/A;   Social History: Social History  Substance Use Topics  . Smoking status: Former Smoker -- 0.00 packs/day for 0 years    Quit date: 11/08/2009  . Smokeless tobacco: Never Used  . Alcohol Use: 3.6 oz/week    6 Cans of beer per week     Comment: ocasional on weekends   Additional social history: lives by himself, able to walk around and take care of himself. Please also refer to relevant sections of EMR.  Family History: Family History  Problem Relation Age of Onset  .  Asthma Sister   . Healthy Brother   . Healthy Brother   . Healthy Brother    Allergies and Medications: No Known Allergies No current facility-administered medications on file prior to encounter.   Current Outpatient Prescriptions on File Prior to Encounter  Medication Sig Dispense Refill  . albuterol (PROVENTIL HFA;VENTOLIN HFA) 108 (90 BASE) MCG/ACT inhaler Inhale 2 puffs into the lungs every 6 (six) hours as needed for wheezing or shortness of breath. 1 Inhaler 6  . albuterol (PROVENTIL) (2.5 MG/3ML) 0.083% nebulizer solution Take 3 mLs (2.5 mg total) by nebulization every 4 (four) hours as needed for wheezing or shortness of breath (dx J44.9). 360 vial 6  . amLODipine (NORVASC) 10 MG tablet Take 1 tablet (10 mg total) by mouth daily. 30 tablet 5  . aspirin EC 81 MG EC tablet Take 1 tablet (81 mg total) by mouth daily. 30 tablet 0  . esomeprazole (NEXIUM) 40 MG capsule TAKE 1 CAPSULE BY MOUTH EVERY MORNING BEFORE A MEAL 90 capsule 0  . fluticasone (FLONASE) 50 MCG/ACT nasal spray SHAKE WELL AND  USE 2 SPRAYS IN EACH NOSTRIL DAILY 16 g 2  . gabapentin (NEURONTIN) 300 MG capsule Take 300 mg by mouth 3 (three) times daily.      Marland Kitchen losartan (COZAAR) 100 MG tablet Take 1 tablet (100 mg total) by mouth daily. 30 tablet 5  . pentosan polysulfate (ELMIRON) 100 MG capsule Take 100 mg by mouth 3 (three) times daily before meals.     . tadalafil (CIALIS) 20 MG tablet Take 0.5-1 tablets (10-20 mg total) by mouth every other day as needed for erectile dysfunction. 5 tablet 11  . Tamsulosin HCl (FLOMAX) 0.4 MG CAPS Take 0.4 mg by mouth daily.     Marland Kitchen venlafaxine XR (EFFEXOR-XR) 150 MG 24 hr capsule TAKE 1 CAPSULE(150 MG) BY MOUTH DAILY 90 capsule 3    Objective: BP 127/71 mmHg  Pulse 106  Temp(Src) 97.9 F (36.6 C) (Axillary)  Resp 17  SpO2 96% Exam: General: Well-appearing, thin male in NAD, appears comfortable, [place] in place HEENT: [place]/AT, PERRLA, no scleral icterus, oropharynx clear, MMM Neck:  Supple Cardiovascular: RRR, no m/r/g Respiratory: Normal work of breathing, coarse expiratory sounds auscultated in all lung fields, crackles present in lung bases bilaterally, no wheezes, speaking in complete sentences Abdomen: +BS, soft, non-tender, non-distended MSK: No edema, 5/5 muscle strength in upper and lower extremities bilaterally. Skin: Warm and well-perfused, no rashes or lesions Neuro: Awake, alert, oriented, no focal deficits noted Psych: Appropriate mood and affect  Labs and Imaging: CBC BMET   Recent Labs Lab 01/12/15 0305  WBC 8.1  HGB 10.8*  HCT 32.6*  PLT 206    Recent Labs Lab 01/12/15 0305  NA 132*  K 4.0  CL 92*  CO2 32  BUN 5*  CREATININE 0.87  GLUCOSE 103*  CALCIUM 8.9    - BNP 710.9 - Troponin negative  CXR (9/23): Hazy infiltrate within the peripheral right upper lobe, similar to previous radiograph. Opacity may reflect an area of persistent/chronic mucoid impaction as seen from prior CT. Recurrent/new pneumonia could also be considered. Mild bibasilar atelectasis  Sela Hua, MD 01/12/2015, 6:14 AM PGY-1, [place] Intern pager: [phone], text pages welcome  I have seen and evaluated the patient with Dr. Brett Albino. I am in agreement with the note above in its revised form. My additions are in blue.  Dr. Junie Panning, DO, PGY-2 01/12/2015 11:08 AM

## 2015-01-12 NOTE — ED Provider Notes (Signed)
CSN: [id number]     Arrival date & time 01/12/15  0301 History  This chart was scribed for Delora Fuel, MD by Hansel Feinstein, ED Scribe. This patient was seen in room D33C/D33C and the patient's care was started at 3:13 AM.     Chief Complaint  Patient presents with  . Shortness of Breath   The history is provided by the patient. No language interpreter was used.   HPI Comments: Steven Beasley is a 74 y.o. male BIBA with hx of HTN, asthma, GERD who presents to the Emergency Department complaining of moderate SOB onset this evening with associated productive cough with white phlegm. Pt states he was asymptomatic prior to today. Pt notes no modifying factors. He states no relief with his home nebulizer or inhaler. Pt saw relief with BIPAP post-arrival. Pt states he has not missed any doses of his medications. Hx of similar symptoms. No Hx of ventilator. He denies fever, chills, diaphoresis.   Past Medical History  Diagnosis Date  . Hypertension   . Leukocytopenia   . Asthma   . Abdominal pain, chronic, generalized   . GERD (gastroesophageal reflux disease)   . BPH (benign prostatic hyperplasia)   . 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 Shortness of breath    Past Surgical History  Procedure Laterality Date  . Video bronchoscopy Bilateral 10/28/2012    Procedure: VIDEO BRONCHOSCOPY WITHOUT FLUORO;  Surgeon: Rigoberto Noel, MD;  Location: [facility];  Service: Cardiopulmonary;  Laterality: Bilateral;  . Penile prosthesis implant  1997  . Left and right heart catheterization with coronary angiogram N/A 07/12/2014    Procedure: LEFT AND RIGHT HEART CATHETERIZATION WITH CORONARY ANGIOGRAM;  Surgeon: Burnell Blanks, MD;  Location: [facility] CATH LAB;  Service: Cardiovascular;  Laterality: N/A;   Family History  Problem Relation Age of Onset  . Asthma Sister   . Healthy Brother   . Healthy Brother   . Healthy Brother    Social History  Substance Use Topics  . Smoking status: Former Smoker --  0.00 packs/day for 0 years    Quit date: 11/08/2009  . Smokeless tobacco: Never Used  . Alcohol Use: 3.6 oz/week    6 Cans of beer per week     Comment: ocasional on weekends    Review of Systems  Constitutional: Negative for fever, chills and diaphoresis.  Respiratory: Positive for cough and shortness of breath.   All other systems reviewed and are negative.  Allergies  Review of patient's allergies indicates no known allergies.  Home Medications   Prior to Admission medications   Medication Sig Start Date End Date Taking? Authorizing Provider  albuterol (PROVENTIL HFA;VENTOLIN HFA) 108 (90 BASE) MCG/ACT inhaler Inhale 2 puffs into the lungs every 6 (six) hours as needed for wheezing or shortness of breath. 09/14/14   Elberta Leatherwood, MD  albuterol (PROVENTIL) (2.5 MG/3ML) 0.083% nebulizer solution Take 3 mLs (2.5 mg total) by nebulization every 4 (four) hours as needed for wheezing or shortness of breath (dx J44.9). 07/19/14   Rigoberto Noel, MD  amLODipine (NORVASC) 10 MG tablet Take 1 tablet (10 mg total) by mouth daily. 12/21/14   Brittainy Erie Noe, PA-C  aspirin EC 81 MG EC tablet Take 1 tablet (81 mg total) by mouth daily. 07/13/14   Elberta Leatherwood, MD  esomeprazole (NEXIUM) 40 MG capsule TAKE 1 CAPSULE BY MOUTH EVERY MORNING BEFORE A MEAL 12/20/14   Rigoberto Noel, MD  fluticasone (FLONASE) 50 MCG/ACT nasal spray  SHAKE WELL AND USE 2 SPRAYS IN [facility] NOSTRIL DAILY 12/04/14   Elberta Leatherwood, MD  gabapentin (NEURONTIN) 300 MG capsule Take 300 mg by mouth 3 (three) times daily.      Historical Provider, MD  losartan (COZAAR) 100 MG tablet Take 1 tablet (100 mg total) by mouth daily. 12/21/14   Brittainy Erie Noe, PA-C  pentosan polysulfate (ELMIRON) 100 MG capsule Take 100 mg by mouth 3 (three) times daily before meals.     Historical Provider, MD  tadalafil (CIALIS) 20 MG tablet Take 0.5-1 tablets (10-20 mg total) by mouth every other day as needed for erectile dysfunction. 01/01/15   Elberta Leatherwood,  MD  Tamsulosin HCl (FLOMAX) 0.4 MG CAPS Take 0.4 mg by mouth daily.     Historical Provider, MD  venlafaxine XR (EFFEXOR-XR) 150 MG 24 hr capsule TAKE 1 CAPSULE(150 MG) BY MOUTH DAILY 12/25/14   Elberta Leatherwood, MD   BP 155/79 mmHg  Pulse 107  Temp(Src) 97.9 F (36.6 C) (Axillary)  Resp 14  SpO2 99% Physical Exam  Constitutional: He is oriented to person, place, and time. He appears well-developed and well-nourished.  Pt is on BIPAP  HENT:  Head: Normocephalic and atraumatic.  Eyes: Conjunctivae and EOM are normal. Pupils are equal, round, and reactive to light.  Neck: Normal range of motion. Neck supple. JVD present.  JVD present.   Cardiovascular: Normal rate, regular rhythm and normal heart sounds.   No murmur heard. Pulmonary/Chest: Effort normal. He has no wheezes. He has rhonchi. He has no rales. He exhibits no tenderness.  Coarse Inspiratory and expiratory rhonchi bilaterally.   Abdominal: Soft. Bowel sounds are normal. He exhibits no distension and no mass. There is no tenderness.  Musculoskeletal: Normal range of motion. He exhibits no edema.  Lymphadenopathy:    He has no cervical adenopathy.  Neurological: He is alert and oriented to person, place, and time. No cranial nerve deficit. He exhibits normal muscle tone. Coordination normal.  Skin: Skin is warm and dry. No rash noted.  Psychiatric: He has a normal mood and affect. His behavior is normal. Judgment and thought content normal.  Nursing note and vitals reviewed.   ED Course  Procedures (including critical care time) DIAGNOSTIC STUDIES: Oxygen Saturation is 100% on BIPAP, normal by my interpretation.    COORDINATION OF CARE: 3:18 AM Discussed treatment plan with pt at bedside and pt agreed to plan.   Labs Review Results for orders placed or performed during the hospital encounter of 01/12/15  CBC with Differential  Result Value Ref Range   WBC 8.1 4.0 - 10.5 K/uL   RBC 3.40 (L) 4.22 - 5.81 MIL/uL   Hemoglobin  10.8 (L) 13.0 - 17.0 g/dL   HCT 32.6 (L) 39.0 - 52.0 %   MCV 95.9 78.0 - 100.0 fL   MCH 31.8 26.0 - 34.0 pg   MCHC 33.1 30.0 - 36.0 g/dL   RDW 13.0 11.5 - 15.5 %   Platelets 206 150 - 400 K/uL   Neutrophils Relative % 55 %   Neutro Abs 4.4 1.7 - 7.7 K/uL   Lymphocytes Relative 33 %   Lymphs Abs 2.7 0.7 - 4.0 K/uL   Monocytes Relative 11 %   Monocytes Absolute 0.9 0.1 - 1.0 K/uL   Eosinophils Relative 1 %   Eosinophils Absolute 0.1 0.0 - 0.7 K/uL   Basophils Relative 0 %   Basophils Absolute 0.0 0.0 - 0.1 K/uL  Comprehensive metabolic panel  Result  Value Ref Range   Sodium 132 (L) 135 - 145 mmol/L   Potassium 4.0 3.5 - 5.1 mmol/L   Chloride 92 (L) 101 - 111 mmol/L   CO2 32 22 - 32 mmol/L   Glucose, Bld 103 (H) 65 - 99 mg/dL   BUN 5 (L) 6 - 20 mg/dL   Creatinine, Ser 0.87 0.61 - 1.24 mg/dL   Calcium 8.9 8.9 - 10.3 mg/dL   Total Protein 7.5 6.5 - 8.1 g/dL   Albumin 4.1 3.5 - 5.0 g/dL   AST 28 15 - 41 U/L   ALT 13 (L) 17 - 63 U/L   Alkaline Phosphatase 62 38 - 126 U/L   Total Bilirubin 0.5 0.3 - 1.2 mg/dL   GFR calc non Af Amer >60 >60 mL/min   GFR calc Af Amer >60 >60 mL/min   Anion gap 8 5 - 15  Brain natriuretic peptide  Result Value Ref Range   B Natriuretic Peptide 710.9 (H) 0.0 - 100.0 pg/mL  Troponin I  Result Value Ref Range   Troponin I <0.03 <0.031 ng/mL   Imaging Review Dg Chest Portable 1 View  01/12/2015   CLINICAL DATA:  Initial evaluation for acute shortness of breath.  EXAM: PORTABLE CHEST 1 VIEW  COMPARISON:  Prior radiograph from 07/07/2014.  FINDINGS: Cardiac and mediastinal silhouettes are stable.  Emphysematous changes noted. Mild bibasilar atelectasis. There is persistent mild hazy opacity within the peripheral right lower lobe, similar relative to previous study. Finding may reflect persistent/chronic mucoid impaction and/or recurrent infiltrate. No other focal airspace disease. No pulmonary edema or pleural effusion. No pneumothorax.  No acute osseus  abnormality.  IMPRESSION: 1. Hazy infiltrate within the peripheral right upper lobe, similar relative to previous radiograph from 07/07/2014. Given the chronicity of this finding, opacity may reflect an area of persistent/chronic mucoid impaction as seen on prior CT from 07/09/2014. Recurrent/new pneumonia could also be considered in the correct clinical setting. 2. Mild bibasilar atelectasis.   Electronically Signed   By: Jeannine Boga M.D.   On: 01/12/2015 03:48   I have personally reviewed and evaluated these images and lab results as part of my medical decision-making.   EKG Interpretation None      CRITICAL CARE Performed by: [email or web address],UEAVW Total critical care time: 55 minutes Critical care time was exclusive of separately billable procedures and treating other patients. Critical care was necessary to treat or prevent imminent or life-threatening deterioration. Critical care was time spent personally by me on the following activities: development of treatment plan with patient and/or surrogate as well as nursing, discussions with consultants, evaluation of patient's response to treatment, examination of patient, obtaining history from patient or surrogate, ordering and performing treatments and interventions, ordering and review of laboratory studies, ordering and review of radiographic studies, pulse oximetry and re-evaluation of patient's condition. MDM   Final diagnoses:  COPD exacerbation  CHF exacerbation  Normochromic normocytic anemia  Hyponatremia    Acute dyspnea with findings consistent with both CHF and COPD. Old records are reviewed and he has a history of CHF and history of COPD. He was given continuous nebulizer treatment and methylprednisolone. Chest x-ray shows stable cardiomegaly but no obvious pulmonary edema. BNP has come back somewhat elevated compared with baseline and he is given a dose of furosemide. He seemed to be resting reasonably comfortably but was still  getting his continuous nebulizer treatment. I anticipate he will be able to come off of BiPAP once this is completed. Case is discussed  with Dr. Gerlean Ren of family practice service who agrees to admit the patient.  I personally performed the services described in this documentation, which was scribed in my presence. The recorded information has been reviewed and is accurate.     Delora Fuel, MD 16/57/90 [id number]

## 2015-01-12 NOTE — Progress Notes (Signed)
RT found pt on room air. Sat 89%.  Rt gave Duoneb treatment and then placed pt back on nasal cannula at 1 Lpm. Sat 96%.

## 2015-01-13 DIAGNOSIS — E871 Hypo-osmolality and hyponatremia: Secondary | ICD-10-CM

## 2015-01-13 DIAGNOSIS — J449 Chronic obstructive pulmonary disease, unspecified: Secondary | ICD-10-CM | POA: Diagnosis not present

## 2015-01-13 DIAGNOSIS — I509 Heart failure, unspecified: Secondary | ICD-10-CM | POA: Diagnosis not present

## 2015-01-13 DIAGNOSIS — J441 Chronic obstructive pulmonary disease with (acute) exacerbation: Secondary | ICD-10-CM | POA: Diagnosis not present

## 2015-01-13 LAB — BASIC METABOLIC PANEL
Anion gap: 6 (ref 5–15)
BUN: 12 mg/dL (ref 6–20)
CALCIUM: 9.1 mg/dL (ref 8.9–10.3)
CO2: 33 mmol/L — ABNORMAL HIGH (ref 22–32)
CREATININE: 0.96 mg/dL (ref 0.61–1.24)
Chloride: 96 mmol/L — ABNORMAL LOW (ref 101–111)
GFR calc Af Amer: >60 mL/min (ref >60)
Glucose, Bld: 156 mg/dL — ABNORMAL HIGH (ref 65–99)
POTASSIUM: 4.2 mmol/L (ref 3.5–5.1)
SODIUM: 135 mmol/L (ref 135–145)

## 2015-01-13 MED ORDER — ALBUTEROL SULFATE HFA 108 (90 BASE) MCG/ACT IN AERS
2.0000 | INHALATION_SPRAY | RESPIRATORY_TRACT | Status: AC
  Administered 2015-01-13: 2 via RESPIRATORY_TRACT
  Filled 2015-01-13: qty 6.7

## 2015-01-13 MED ORDER — ALBUTEROL SULFATE (2.5 MG/3ML) 0.083% IN NEBU: 2.5000 mg | INHALATION_SOLUTION | Freq: Four times a day (QID) | RESPIRATORY_TRACT | Status: DC | PRN

## 2015-01-13 MED ORDER — DOXYCYCLINE HYCLATE 100 MG PO TABS: 100.0000 mg | ORAL_TABLET | Freq: Two times a day (BID) | ORAL | Status: AC

## 2015-01-13 MED ORDER — ALBUTEROL SULFATE (2.5 MG/3ML) 0.083% IN NEBU
2.5000 mg | INHALATION_SOLUTION | Freq: Four times a day (QID) | RESPIRATORY_TRACT | Status: DC | PRN
Start: 2015-01-13 — End: 2015-01-13
  Filled 2015-01-13: qty 3

## 2015-01-13 MED ORDER — TIOTROPIUM BROMIDE MONOHYDRATE 18 MCG IN CAPS
18.0000 ug | ORAL_CAPSULE | Freq: Every day | RESPIRATORY_TRACT | Status: DC
  Filled 2015-01-13: qty 5

## 2015-01-13 MED ORDER — IPRATROPIUM-ALBUTEROL 0.5-2.5 (3) MG/3ML IN SOLN
3.0000 mL | Freq: Four times a day (QID) | RESPIRATORY_TRACT | Status: DC
  Administered 2015-01-13: 3 mL via RESPIRATORY_TRACT
  Filled 2015-01-13: qty 3

## 2015-01-13 MED ORDER — ALBUTEROL SULFATE HFA 108 (90 BASE) MCG/ACT IN AERS: 1.0000 | INHALATION_SPRAY | Freq: Four times a day (QID) | RESPIRATORY_TRACT | Status: DC | PRN

## 2015-01-13 MED ORDER — PREDNISONE 50 MG PO TABS: 50.0000 mg | ORAL_TABLET | Freq: Every day | ORAL | Status: DC

## 2015-01-13 NOTE — Discharge Summary (Signed)
[facility] Discharge Summary  Patient name: Steven Beasley Medical record number: [id number] Date of birth: 1940-04-22 Age: 74 y.o. Gender: male Date of Admission: 01/12/2015  Date of Discharge: 01/13/15 Admitting Physician: Blane Ohara McDiarmid, MD  Primary Care Tyrhonda Georgiades: Georges Lynch, MD Consultants: None  Indication for Hospitalization: COPD Exacerbation  Discharge Diagnoses/Problem List:  COPD CHF HTN GERD  Disposition: Home  Discharge Condition: Stable, improved  Discharge Exam:  General: Well-appearing, thin male in NAD, appears comfortable, [place] in place HEENT: [place]/AT, PERRLA, no scleral icterus, oropharynx clear, MMM Neck: Supple Cardiovascular: RRR, no m/r/g Respiratory: Normal work of breathing, occasional coarse expiratory sounds auscultated, mild crackles present in lung bases bilaterally, no wheezes, speaking in complete sentences Abdomen: +BS, soft, non-tender, non-distended MSK: No edema Skin: Warm and well-perfused, no rashes or lesions Neuro: Awake, alert, oriented, no focal deficits noted Psych: Appropriate mood and affect  Brief Hospital Course:  Mr. Delancey is a 74 year old M who presented to the ED with shortness of breath that was gradual in onset. He has had cough productive of white sputum for years and stated that his cough might be worse. He denied any dyspnea on exertion, orthopnea, or PND. He does not use any O2 at home. He came into the ED on CPAP. He was given Lasix x 1 and an hour long nebulizer treatment through BiPAP. He was then transitioned to 4L O2 by [place]. He was given Solumedrol x 1 and Azithromycin in the ED. CXR showed hazy infiltrate within the peripheral right upper lobe that was similar to a previous x-ray from 06/2014. His EKG showed QT prolongation (QTc 521) but was unchanged from a previous study and his troponin was negative x 1. BNP was elevated at 710 (~500 at baseline), but he was at his baseline weight. He was  admitted for overnight observation for likely COPD exacerbation. He received duonebs every 4 hours and his O2 was able to be weaned from 4L to RA. He also received an additional dose of Lasix 40mg  PO. On the day of discharge, he was able to ambulate without desaturations on RA. He was transitioned from IV Solumedrol to Prednisone and IV Azithromycin to PO Doxycycline, given his QT prolongation. He was discharged with a follow-up appointment in our clinic.  Issues for Follow Up:  1. Pt sent home with Albuterol and Spiriva. Consider continuing Spiriva as a controller medication to prevent future hospitalizations. Pt is active with [facility] so they may be able to assist with getting him inhalers. 2. Pt with QT prolongation on EKG. Please avoid drugs that cause QT prolongation. 3. Sent home on Prednisone 50mg  qd x 5 days and Doxycycline 100mg  bid x 5 days. Please make sure Pt has taken these.  Significant Procedures: None  Significant Labs and Imaging:   Recent Labs Lab 01/12/15 0305  WBC 8.1  HGB 10.8*  HCT 32.6*  PLT 206    Recent Labs Lab 01/12/15 0305 01/13/15 0526  NA 132* 135  K 4.0 4.2  CL 92* 96*  CO2 32 33*  GLUCOSE 103* 156*  BUN 5* 12  CREATININE 0.87 0.96  CALCIUM 8.9 9.1  ALKPHOS 62  --   AST 28  --   ALT 13*  --   ALBUMIN 4.1  --    Troponin: <0.03 BNP: 710  EKG (9/24): Sinus tachycardia, atrial premature complexes, RBBB and LAFB, minimal ST elevation in the lateral leads, no significant change from previous study 06/2014. CXR (9/24):  Hazy infiltrate within the peripheral right upper lobe, similar to previous radiograph from 06/2014. Opacity may reflect an area of persistent/chronic mucoid impaction as seen on prior CT from 06/2014. Recurrent/new pneumonia could also be considered. Mild bibasilar atelectasis.  Results/Tests Pending at Time of Discharge: None  Discharge Medications:    Medication List    TAKE these medications        albuterol (2.5 MG/3ML) 0.083%  nebulizer solution  Commonly known as:  PROVENTIL  Take 3 mLs (2.5 mg total) by nebulization every 4 (four) hours as needed for wheezing or shortness of breath (dx J44.9).     albuterol 108 (90 BASE) MCG/ACT inhaler  Commonly known as:  PROVENTIL HFA;VENTOLIN HFA  Inhale 2 puffs into the lungs every 6 (six) hours as needed for wheezing or shortness of breath.     amLODipine 10 MG tablet  Commonly known as:  NORVASC  Take 1 tablet (10 mg total) by mouth daily.     aspirin 81 MG EC tablet  Take 1 tablet (81 mg total) by mouth daily.     doxycycline 100 MG tablet  Commonly known as:  VIBRA-TABS  Take 1 tablet (100 mg total) by mouth every 12 (twelve) hours.     esomeprazole 40 MG capsule  Commonly known as:  NEXIUM  TAKE 1 CAPSULE BY MOUTH EVERY MORNING BEFORE A MEAL     fluticasone 50 MCG/ACT nasal spray  Commonly known as:  FLONASE  SHAKE WELL AND USE 2 SPRAYS IN EACH NOSTRIL DAILY     gabapentin 300 MG capsule  Commonly known as:  NEURONTIN  Take 300 mg by mouth 3 (three) times daily.     losartan 100 MG tablet  Commonly known as:  COZAAR  Take 1 tablet (100 mg total) by mouth daily.     pentosan polysulfate 100 MG capsule  Commonly known as:  ELMIRON  Take 100 mg by mouth 3 (three) times daily before meals.     predniSONE 50 MG tablet  Commonly known as:  DELTASONE  Take 1 tablet (50 mg total) by mouth daily with breakfast.     tadalafil 20 MG tablet  Commonly known as:  CIALIS  Take 0.5-1 tablets (10-20 mg total) by mouth every other day as needed for erectile dysfunction.     tamsulosin 0.4 MG Caps capsule  Commonly known as:  FLOMAX  Take 0.4 mg by mouth daily.     venlafaxine XR 150 MG 24 hr capsule  Commonly known as:  EFFEXOR-XR  TAKE 1 CAPSULE(150 MG) BY MOUTH DAILY        Discharge Instructions: Please refer to Patient Instructions section of EMR for full details.  Patient was counseled important signs and symptoms that should prompt return to  medical care, changes in medications, dietary instructions, activity restrictions, and follow up appointments.   Follow-Up Appointments: Follow-up Information    Follow up with Chrisandra Netters, MD On 01/17/2015.   Specialty:  Family Medicine   Why:  Hosp f/u appt at 1:30pm   Contact information:   [place] [address] [phone]       Follow up with [facility] On 01/24/2015.   Specialty:  Pulmonology   Why:  Appointment at on 10/6 at 14:00 patient is aware of appointment   Contact information:   [place] [phone]      Follow up with [facility].   Why:  Resumption of  Community  Care Management [facility] will contact patient by phone with 1-3 days post discharge.   Contact information:   [address] [place], [place] [phone]      Sela Hua, MD 01/13/2015, 9:29 PM PGY-1, [place]

## 2015-01-13 NOTE — Discharge Instructions (Signed)
You were hospitalized because you had an acute worsening of your COPD. You had increased sputum production and cough and required oxygen to help you breathe more comfortably. We started you on steroids and antibiotics and your breathing improved. Please continue to take the Prednisone 50mg  every day for the next 3 days (end on 9/28). Please continue to take the Doxycycline (antibiotic) twice a day for the next 3 days (end on 9/28). We have also sent you home with an Albuterol inhaler. You can take 1-2 puffs every 2 hours as needed for shortness of breath. We have also sent you home with a Spiriva inhaler. You should take this every day no matter how you are breathing, because it will help prevent shortness of breath.  If you have any shortness of breath, high fever, or feeling sick, please see [place] in our clinic or come to the emergency department.

## 2015-01-13 NOTE — Progress Notes (Signed)
Family Medicine Teaching Service Daily Progress Note Intern Pager: [phone]  Patient name: Steven Beasley Medical record number: [id number] Date of birth: 1941-02-03 Age: 74 y.o. Gender: male  Primary Care Provider: Georges Lynch, MD Consultants: None Code Status: FULL  Pt Overview and Major Events to Date:  9/24: Admitted to the FPTS for presumed COPD exacerbation  Assessment and Plan: SEM MCCAUGHEY is a 74 y.o. male presenting with shortness of breath, likely CHF exacerbation vs COPD exacerbation. PMH is significant for COPD, CHF (EF 30-35%), HTN.  Shortness of breath: Likely COPD exacerbation, given his rhonchi on exam. Could also be caused by CHF exacerbation, given his BNP of 710 and crackles in lung bases on exam; however, he does not have any lower extremity edema and his CXR is not consistent with pulmonary edema. He is also not having any orthopnea or PND. Does not have an oxygen requirement at baseline. He was weaned down from 4L -> 1L O2 yesterday.  - Spaced out duonebs from every 4 hours to every 6 hours.  - Will transition him from Solumedrol to Prednisone 40mg  today for a 5 day burst. - Will transition from IV Doxycycline to PO Doxycycline today to complete a 5 day course. - Will send Pt home with Albuterol and Spiriva inhalers - Cardiac monitoring - Daily weights - Strict I's & O's - Will wean off O2 today. - Ambulate with pulse ox today.  HFrEF: Last ECHO (06/2014): EF 30-35%, possible moderate hypokinesis of the inferior myocardium, LA and RA mildly dilated, PA peak pressure 46 mmHg. Does not appear fluid overloaded today, no LE edema. Slight crackles were noted in lung bases bilaterally. Received Lasix 40mg  IV x1 in ED - Continue home med: Cozaar 100mg  qd - Consider additional lasix doses if no improvement - Daily weights. Baseline weight159lb at last office visit on 9/13. Weight was 155lb on admission.  HTN- Stable. BPs ranging from 108-133/84-95 over the last  24 hours. - Continue home meds: Norvasc 10mg  qd, Cozaar 100mg  qd  GERD - Protonix 80mg  qd  BPH: - Continue home med: Flomax 0.4mg  qd  Prolonged QTc: QTc noted to be prolonged to 521 on EKG  - Caution with medications known to prolong QT.   FEN/GI: Saline lock IV, Heart Healthy diet PPx: Heparin sq  Disposition: Home likely today if he can wean to room air and ambulate without desaturations  Subjective:  Pt states he is doing better this morning. He feels like his breathing is much improved from when he was admitted. He would like to go home today.  Objective: Temp:  [97.6 F (36.4 C)-98 F (36.7 C)] 98 F (36.7 C) (09/25 0555) Pulse Rate:  [89-110] 89 (09/25 0555) Resp:  [16-18] 17 (09/25 0555) BP: (113-133)/(64-84) 113/64 mmHg (09/25 0555) SpO2:  [95 %-100 %] 100 % (09/25 0555) Weight:  [154 lb 8.7 oz (70.1 kg)-155 lb 6.8 oz (70.5 kg)] 155 lb 6.8 oz (70.5 kg) (09/24 2206) Physical Exam: General: Well-appearing, thin male in NAD, appears comfortable, [place] in place HEENT: [place]/AT, PERRLA, no scleral icterus, oropharynx clear, MMM Neck: Supple Cardiovascular: RRR, no m/r/g Respiratory: Normal work of breathing, occasional coarse expiratory sounds auscultated, mild crackles present in lung bases bilaterally, no wheezes, speaking in complete sentences Abdomen: +BS, soft, non-tender, non-distended MSK: No edema Skin: Warm and well-perfused, no rashes or lesions Neuro: Awake, alert, oriented, no focal deficits noted Psych: Appropriate mood and affect  Laboratory:  Recent Labs Lab 01/12/15 0305  WBC 8.1  HGB 10.8*  HCT 32.6*  PLT 206    Recent Labs Lab 01/12/15 0305 01/13/15 0526  NA 132* 135  K 4.0 4.2  CL 92* 96*  CO2 32 33*  BUN 5* 12  CREATININE 0.87 0.96  CALCIUM 8.9 9.1  PROT 7.5  --   BILITOT 0.5  --   ALKPHOS 62  --   ALT 13*  --   AST 28  --   GLUCOSE 103* 156*   - BNP 710.9 - Troponin negative  Imaging/Diagnostic Tests: CXR (9/23): Hazy  infiltrate within the peripheral right upper lobe, similar to previous radiograph. Opacity may reflect an area of persistent/chronic mucoid impaction as seen from prior CT. Recurrent/new pneumonia could also be considered. Mild bibasilar atelectasis  Sela Hua, MD 01/13/2015, 7:06 AM PGY-1, [place] Intern pager: [phone], text pages welcome

## 2015-01-13 NOTE — Care Management Note (Signed)
Case Management Note  Patient Details  Name: Steven Beasley MRN: [id number] Date of Birth: Sep 27, 1940  Subjective/Objective:  Patient presented to [facility] ED with exacerbation of COPD                  Action/Plan:  CM reviewed patient's record, met with patient at bedside and confirmed information. Patient  receives care at the [facility],has follow up 9/29 at 1:30. He reports living alone, but  patient is active with [facility]. Discussed resuming [facility] services upon discharge patient is agreeable.[facility] consult placed. Denies any difficulty obtaining monthly medication. Patient has a follow up appt at [facility] 10/6 at 1400, patient is aware of appt. All scheduled  appointment place on patient's AVS. No further CM needs identified.  Expected Discharge Date:  01/14/15               Expected Discharge Plan:  Home/Self Care  In-House Referral:     Discharge planning Services  Follow-up appt scheduled  Post Acute Care Choice:    Choice offered to:     DME Arranged:    DME Agency:     HH Arranged:    HH Agency:  [place]  Status of Service:  Completed, signed off  Medicare Important Message Given:    Date Medicare IM Given:    Medicare IM give by:    Date Additional Medicare IM Given:    Additional Medicare Important Message give by:     If discussed at [place] of Stay Meetings, dates discussed:    Additional CommentsLaurena Slimmer, RN 01/13/2015, 5:08 PM

## 2015-01-17 ENCOUNTER — Ambulatory Visit (INDEPENDENT_AMBULATORY_CARE_PROVIDER_SITE_OTHER): Payer: Commercial Managed Care - HMO | Admitting: Family Medicine

## 2015-01-17 ENCOUNTER — Encounter: Payer: Self-pay | Admitting: Family Medicine

## 2015-01-17 VITALS — BP 121/71 | HR 97 | Temp 97.9°F | Ht 73.0 in | Wt 164.0 lb

## 2015-01-17 DIAGNOSIS — N529 Male erectile dysfunction, unspecified: Secondary | ICD-10-CM

## 2015-01-17 DIAGNOSIS — J441 Chronic obstructive pulmonary disease with (acute) exacerbation: Secondary | ICD-10-CM | POA: Diagnosis not present

## 2015-01-17 DIAGNOSIS — Z09 Encounter for follow-up examination after completed treatment for conditions other than malignant neoplasm: Secondary | ICD-10-CM

## 2015-01-17 MED ORDER — TIOTROPIUM BROMIDE MONOHYDRATE 18 MCG IN CAPS: 18.0000 ug | ORAL_CAPSULE | Freq: Every day | RESPIRATORY_TRACT | Status: AC

## 2015-01-17 NOTE — Progress Notes (Signed)
Patient ID: Steven Beasley, male   DOB: 11-13-1940, 74 y.o.   MRN: [id number]  HPI:  Steven Beasley presents for hospital follow up. Patient was hospitalized from 01/12/15 to 01/13/15 with COPD exacerbation. Discharged on doxycycline and prednisone. Was able to get these medications and is compliant with them. He is using albuterol about 8-10 times total per week. Some days does not need it at all. Using spiriva more than once a day (was not aware it's a once/day medication). Overall patient is feeling much better. Breathing is better. No fevers. Eating and drinking well.   Also has questions about medication for erectile dysfunction - could not afford cialis and wants to know about other options.  ROS: See HPI.  [place]: history of chronic abdominal pain, chronic HFrEF, BPH, COPD, GERD, resting tremor, smoldering myeloma  PHYSICAL EXAM: BP 121/71 mmHg  Pulse 97  Temp(Src) 97.9 F (36.6 C) (Oral)  Ht 6\' 1"  (1.854 m)  Wt 164 lb (74.39 kg)  BMI 21.64 kg/m2 Gen: NAD, pleasant, cooperative HEENT: NCAT Heart: regular rate and rhythm no murmur Lungs: clear to auscultation bilaterally. Decreased air movement throughout but no wheezes or crackles. Speaks in full sentences without distress. Neuro: grossly nonfocal, speech normal Ext: atraumatic  ASSESSMENT/PLAN:  COPD exacerbation Improved. Patient will complete course of prednisone & doxycycline. Given new rx for spiriva, counseled to use this only once per day F/u with PCP in 1 mo, sooner if needed  Erectile dysfunction Encouraged patient to speak with his urologist about his ED. They may have samples or other ways of him obtaining medications cheaper since he could nto afford the cialis.   FOLLOW UP: F/u in 1 month with PCP for chronic medical issues & COPD  [place] J. Ardelia Mems, [place]

## 2015-01-17 NOTE — Patient Instructions (Signed)
Continue spiriva, use this only once per day Talk to your urologist about the issues with erections Follow up in 1 month with Dr. Alease Frame  Be well, Dr. Ardelia Mems

## 2015-01-18 NOTE — Assessment & Plan Note (Signed)
Improved. Patient will complete course of prednisone & doxycycline. Given new rx for spiriva, counseled to use this only once per day F/u with PCP in 1 mo, sooner if needed

## 2015-01-18 NOTE — Assessment & Plan Note (Signed)
Encouraged patient to speak with his urologist about his ED. They may have samples or other ways of him obtaining medications cheaper since he could nto afford the cialis.

## 2015-01-24 ENCOUNTER — Ambulatory Visit: Payer: Commercial Managed Care - HMO | Admitting: Adult Health

## 2015-01-31 ENCOUNTER — Encounter (HOSPITAL_COMMUNITY): Payer: Self-pay | Admitting: *Deleted

## 2015-01-31 ENCOUNTER — Emergency Department (HOSPITAL_COMMUNITY)
Admission: EM | Admit: 2015-01-31 | Discharge: 2015-02-01 | Disposition: A | Discharge status: Home / Self Care | Payer: Commercial Managed Care - HMO | Attending: Emergency Medicine | Admitting: Emergency Medicine

## 2015-01-31 ENCOUNTER — Emergency Department (HOSPITAL_COMMUNITY): Payer: Commercial Managed Care - HMO

## 2015-01-31 DIAGNOSIS — G8929 Other chronic pain: Secondary | ICD-10-CM | POA: Diagnosis not present

## 2015-01-31 DIAGNOSIS — Z8579 Personal history of other malignant neoplasms of lymphoid, hematopoietic and related tissues: Secondary | ICD-10-CM | POA: Diagnosis not present

## 2015-01-31 DIAGNOSIS — K219 Gastro-esophageal reflux disease without esophagitis: Secondary | ICD-10-CM | POA: Diagnosis not present

## 2015-01-31 DIAGNOSIS — Z87891 Personal history of nicotine dependence: Secondary | ICD-10-CM | POA: Diagnosis not present

## 2015-01-31 DIAGNOSIS — J441 Chronic obstructive pulmonary disease with (acute) exacerbation: Secondary | ICD-10-CM

## 2015-01-31 DIAGNOSIS — Z862 Personal history of diseases of the blood and blood-forming organs and certain disorders involving the immune mechanism: Secondary | ICD-10-CM | POA: Diagnosis not present

## 2015-01-31 DIAGNOSIS — Z7951 Long term (current) use of inhaled steroids: Secondary | ICD-10-CM | POA: Diagnosis not present

## 2015-01-31 DIAGNOSIS — Z79899 Other long term (current) drug therapy: Secondary | ICD-10-CM | POA: Insufficient documentation

## 2015-01-31 DIAGNOSIS — Z7982 Long term (current) use of aspirin: Secondary | ICD-10-CM | POA: Diagnosis not present

## 2015-01-31 DIAGNOSIS — N400 Enlarged prostate without lower urinary tract symptoms: Secondary | ICD-10-CM | POA: Diagnosis not present

## 2015-01-31 DIAGNOSIS — R0602 Shortness of breath: Secondary | ICD-10-CM | POA: Diagnosis present

## 2015-01-31 DIAGNOSIS — I10 Essential (primary) hypertension: Secondary | ICD-10-CM | POA: Diagnosis not present

## 2015-01-31 LAB — CBC WITH DIFFERENTIAL/PLATELET
BASOS ABS: 0 10*3/uL (ref 0.0–0.1)
BASOS PCT: 0 %
Eosinophils Absolute: 0.1 10*3/uL (ref 0.0–0.7)
Eosinophils Relative: 1 %
HEMATOCRIT: 32.6 % — AB (ref 39.0–52.0)
HEMOGLOBIN: 11 g/dL — AB (ref 13.0–17.0)
LYMPHS PCT: 19 %
Lymphs Abs: 1 10*3/uL (ref 0.7–4.0)
MCH: 32.9 pg (ref 26.0–34.0)
MCHC: 33.7 g/dL (ref 30.0–36.0)
MCV: 97.6 fL (ref 78.0–100.0)
Monocytes Absolute: 0.4 10*3/uL (ref 0.1–1.0)
Monocytes Relative: 8 %
NEUTROS ABS: 3.9 10*3/uL (ref 1.7–7.7)
NEUTROS PCT: 72 %
Platelets: 218 10*3/uL (ref 150–400)
RBC: 3.34 MIL/uL — AB (ref 4.22–5.81)
RDW: 13.3 % (ref 11.5–15.5)
WBC: 5.3 10*3/uL (ref 4.0–10.5)

## 2015-01-31 LAB — I-STAT ARTERIAL BLOOD GAS, ED
ACID-BASE EXCESS: 5 mmol/L — AB (ref 0.0–2.0)
BICARBONATE: 31 meq/L — AB (ref 20.0–24.0)
O2 Saturation: 98 %
PH ART: 7.398 (ref 7.350–7.450)
PO2 ART: 108 mmHg — AB (ref 80.0–100.0)
TCO2: 33 mmol/L (ref 0–100)
pCO2 arterial: 50.2 mmHg — ABNORMAL HIGH (ref 35.0–45.0)

## 2015-01-31 LAB — I-STAT CHEM 8, ED
BUN: 8 mg/dL (ref 6–20)
CHLORIDE: 94 mmol/L — AB (ref 101–111)
Calcium, Ion: 1.1 mmol/L — ABNORMAL LOW (ref 1.13–1.30)
Creatinine, Ser: 1 mg/dL (ref 0.61–1.24)
Glucose, Bld: 107 mg/dL — ABNORMAL HIGH (ref 65–99)
HEMATOCRIT: 37 % — AB (ref 39.0–52.0)
Hemoglobin: 12.6 g/dL — ABNORMAL LOW (ref 13.0–17.0)
Potassium: 4 mmol/L (ref 3.5–5.1)
SODIUM: 136 mmol/L (ref 135–145)
TCO2: 29 mmol/L (ref 0–100)

## 2015-01-31 LAB — I-STAT TROPONIN, ED: Troponin i, poc: 0.04 ng/mL (ref 0.00–0.08)

## 2015-01-31 MED ORDER — ALBUTEROL (5 MG/ML) CONTINUOUS INHALATION SOLN
10.0000 mg/h | INHALATION_SOLUTION | RESPIRATORY_TRACT | Status: AC
  Administered 2015-01-31: 10 mg/h via RESPIRATORY_TRACT
  Filled 2015-01-31: qty 20

## 2015-01-31 NOTE — ED Provider Notes (Signed)
CSN: [id number]     Arrival date & time 01/31/15  2022 History   First MD Initiated Contact with Patient 01/31/15 2025     Chief Complaint  Patient presents with  . Shortness of Breath     (Consider location/radiation/quality/duration/timing/severity/associated sxs/prior Treatment) HPI Comments: The patient is in respiratory distress, he arrives by paramedic transport after having acute onset of shortness of breath today which progressively worsened, has been severe, persistent and worse with any ambulation. He has a cough productive of white phlegm, has recently been admitted to the hospital approximately 3 weeks ago during which time he had a treatment for COPD including a discharge on prednisone and doxycycline which she states he finished. He followed up in the office for a post discharge follow-up and had been doing well at that time. He denies fevers, swelling of the legs, orthopnea but has no sore throat, nasal congestion, headache or fevers. Paramedics noted the patient to be in persistent respiratory distress despite giving albuterol treatments. He had Solu-Medrol and 10 mg of albuterol prior to arrival. He has been using his albuterol inhaler all day, approximately 6 treatments at home without any significant improvement.  The history is provided by the patient, medical records and the EMS personnel.    Past Medical History  Diagnosis Date  . Hypertension   . Leukocytopenia   . Asthma   . Abdominal pain, chronic, generalized   . GERD (gastroesophageal reflux disease)   . BPH (benign prostatic hyperplasia)   . Smoldering myeloma ([place]) 10/19/2011  . Shortness of breath    Past Surgical History  Procedure Laterality Date  . Video bronchoscopy Bilateral 10/28/2012    Procedure: VIDEO BRONCHOSCOPY WITHOUT FLUORO;  Surgeon: Rigoberto Noel, MD;  Location: [facility];  Service: Cardiopulmonary;  Laterality: Bilateral;  . Penile prosthesis implant  1997  . Left and right heart  catheterization with coronary angiogram N/A 07/12/2014    Procedure: LEFT AND RIGHT HEART CATHETERIZATION WITH CORONARY ANGIOGRAM;  Surgeon: Burnell Blanks, MD;  Location: [facility] CATH LAB;  Service: Cardiovascular;  Laterality: N/A;   Family History  Problem Relation Age of Onset  . Asthma Sister   . Healthy Brother   . Healthy Brother   . Healthy Brother   . Alcoholism Father   . Stroke Mother   . Healthy Sister   . Healthy Sister   . Healthy Sister    Social History  Substance Use Topics  . Smoking status: Former Smoker -- 0.00 packs/day for 0 years    Quit date: 11/08/2009  . Smokeless tobacco: Never Used  . Alcohol Use: 3.6 oz/week    6 Cans of beer per week     Comment: ocasional on weekends    Review of Systems  All other systems reviewed and are negative.     Allergies  Review of patient's allergies indicates no known allergies.  Home Medications   Prior to Admission medications   Medication Sig Start Date End Date Taking? Authorizing Provider  albuterol (PROVENTIL HFA;VENTOLIN HFA) 108 (90 BASE) MCG/ACT inhaler Inhale 2 puffs into the lungs every 6 (six) hours as needed for wheezing or shortness of breath. 09/14/14  Yes Elberta Leatherwood, MD  albuterol (PROVENTIL) (2.5 MG/3ML) 0.083% nebulizer solution Take 3 mLs (2.5 mg total) by nebulization every 4 (four) hours as needed for wheezing or shortness of breath (dx J44.9). 07/19/14  Yes Rigoberto Noel, MD  amLODipine (NORVASC) 10 MG tablet Take 1 tablet (10 mg total) by  mouth daily. 12/21/14  Yes Brittainy Erie Noe, PA-C  aspirin EC 81 MG EC tablet Take 1 tablet (81 mg total) by mouth daily. 07/13/14  Yes Elberta Leatherwood, MD  esomeprazole (NEXIUM) 40 MG capsule TAKE 1 CAPSULE BY MOUTH EVERY MORNING BEFORE A MEAL 12/20/14  Yes Rigoberto Noel, MD  fluticasone (FLONASE) 50 MCG/ACT nasal spray SHAKE WELL AND USE 2 SPRAYS IN EACH NOSTRIL DAILY 12/04/14  Yes Elberta Leatherwood, MD  gabapentin (NEURONTIN) 300 MG capsule Take 300 mg by mouth 3  (three) times daily.     Yes Historical Provider, MD  losartan (COZAAR) 100 MG tablet Take 1 tablet (100 mg total) by mouth daily. 12/21/14  Yes Brittainy Erie Noe, PA-C  pentosan polysulfate (ELMIRON) 100 MG capsule Take 100 mg by mouth 3 (three) times daily before meals.    Yes Historical Provider, MD  tadalafil (CIALIS) 20 MG tablet Take 0.5-1 tablets (10-20 mg total) by mouth every other day as needed for erectile dysfunction. 01/01/15  Yes Elberta Leatherwood, MD  Tamsulosin HCl (FLOMAX) 0.4 MG CAPS Take 0.4 mg by mouth daily.    Yes Historical Provider, MD  tiotropium (SPIRIVA HANDIHALER) 18 MCG inhalation capsule Place 1 capsule (18 mcg total) into inhaler and inhale daily. 01/17/15  Yes Leeanne Rio, MD  venlafaxine XR (EFFEXOR-XR) 150 MG 24 hr capsule TAKE 1 CAPSULE(150 MG) BY MOUTH DAILY 12/25/14  Yes Elberta Leatherwood, MD  predniSONE (DELTASONE) 20 MG tablet Take 3 tablets (60 mg total) by mouth daily. 02/01/15   Noemi Chapel, MD   BP 137/68 mmHg  Pulse 52  Temp(Src) 97.1 F (36.2 C) (Oral)  Resp 17  Ht 6\' 1"  (1.854 m)  Wt 170 lb (77.111 kg)  BMI 22.43 kg/m2  SpO2 100% Physical Exam  Constitutional: He appears well-developed and well-nourished. He appears distressed.  HENT:  Head: Normocephalic and atraumatic.  Mouth/Throat: Oropharynx is clear and moist. No oropharyngeal exudate.  Eyes: Conjunctivae and EOM are normal. Pupils are equal, round, and reactive to light. Right eye exhibits no discharge. Left eye exhibits no discharge. No scleral icterus.  Neck: Normal range of motion. Neck supple. No JVD present. No thyromegaly present.  Cardiovascular: Regular rhythm, normal heart sounds and intact distal pulses.  Exam reveals no gallop and no friction rub.   No murmur heard. Tachycardia, irregularly irregular rhythm  Pulmonary/Chest: He is in respiratory distress. He has wheezes. He has no rales.  Increased work of breathing, accessory muscle use, wheezing in all lung fields on  inspiration and expiration. Prolonged expiratory phase present  Abdominal: Soft. Bowel sounds are normal. He exhibits no distension and no mass. There is no tenderness.  Musculoskeletal: Normal range of motion. He exhibits no edema or tenderness.  Lymphadenopathy:    He has no cervical adenopathy.  Neurological: He is alert. Coordination normal.  Skin: Skin is warm. No rash noted. He is diaphoretic. No erythema.  Psychiatric: He has a normal mood and affect. His behavior is normal.  Nursing note and vitals reviewed.   ED Course  Procedures (including critical care time) Labs Review Labs Reviewed  CBC WITH DIFFERENTIAL/PLATELET - Abnormal; Notable for the following:    RBC 3.34 (*)    Hemoglobin 11.0 (*)    HCT 32.6 (*)    All other components within normal limits  I-STAT CHEM 8, ED - Abnormal; Notable for the following:    Chloride 94 (*)    Glucose, Bld 107 (*)    Calcium,  Ion 1.10 (*)    Hemoglobin 12.6 (*)    HCT 37.0 (*)    All other components within normal limits  I-STAT ARTERIAL BLOOD GAS, ED - Abnormal; Notable for the following:    pCO2 arterial 50.2 (*)    pO2, Arterial 108.0 (*)    Bicarbonate 31.0 (*)    Acid-Base Excess 5.0 (*)    All other components within normal limits  BLOOD GAS, ARTERIAL  I-STAT TROPOININ, ED    Imaging Review Dg Chest 2 View  01/31/2015  CLINICAL DATA:  Respiratory distress. Patient fell like something was stuck in his chest for 6 hours today. History of asthma. Previous smoker. Chest congestion for 6 months. EXAM: CHEST  2 VIEW COMPARISON:  01/12/2015 FINDINGS: Normal heart size and pulmonary vascularity. Hyperinflation of the lungs consistent with emphysematous change versus asthmatic change. There is progressing interstitial pattern to the lungs suggesting interstitial fibrosis or less likely edema. Focal nodular infiltrates in the right mid lung similar to previous chest radiograph and corresponding to nodular infiltrates seen on  previous chest CT 07/09/2014. Peribronchial thickening. Chronic interstitial process with probable mucous plugging is likely. Superimposed acute bronchitis not excluded. No blunting of costophrenic angles. No pneumothorax. Mediastinal contours appear intact. Degenerative changes in the spine. IMPRESSION: Pulmonary hyperinflation with progressing interstitial pattern to the lungs and focal nodular infiltrates in the right mid lung similar previous studies. Appearance suggests chronic interstitial process, possibly with a superimposed acute bronchitis. Electronically Signed   By: Lucienne Capers M.D.   On: 01/31/2015 21:11   I have personally reviewed and evaluated these images and lab results as part of my medical decision-making.   EKG Interpretation   Date/Time:  Thursday January 31 2015 20:35:00 EDT Ventricular Rate:  128 PR Interval:    QRS Duration: 130 QT Interval:  342 QTC Calculation: 499 R Axis:   -97 Text Interpretation:  Normal sinus rhythm with ectopy RBBB and LAFB Since  last tracing rate faster Reconfirmed by Yavier Snider  MD, Andrae Claunch ([id number]) on  02/01/2015 12:36:18 AM      MDM   Final diagnoses:  Chronic obstructive pulmonary disease with acute exacerbation ([place])    The patient is in distress, this is likely COPD exacerbation, question pneumonia versus pneumothorax, likely chest COPD. Continue his nebulizer, steroids given prehospital, chest x-ray pending. Continuous cardiac monitoring, ABG.  The patient was reevaluated multiple times during the course of his emergency department visit, he improved significantly each time and after the continuous nebulizer was finished, chest x-ray was negative, labs unremarkable, EKG without significant changes he was finally able to ambulate and keep his oxygen saturation between 90 and 92% at the lowest. He is speaking in full sentences, feels remarkably better, has medications at home, we'll refill  prednisone, he has albuterol at home. The  patient is in agreement  Meds given in ED:  Medications  albuterol (PROVENTIL,VENTOLIN) solution continuous neb (0 mg/hr Nebulization Stopped 01/31/15 2240)    New Prescriptions   PREDNISONE (DELTASONE) 20 MG TABLET    Take 3 tablets (60 mg total) by mouth daily.         Noemi Chapel, MD 02/01/15 [phone]

## 2015-01-31 NOTE — ED Notes (Signed)
Pt arrives from home via [facility]. Pt c/o SOB that started today. Pt states that he has been coughing and have been really congested. Pt 94% on RA. Ems gave 125 solumedrol PTA.

## 2015-02-01 MED ORDER — PREDNISONE 20 MG PO TABS: 60.0000 mg | ORAL_TABLET | Freq: Every day | ORAL | Status: DC

## 2015-02-01 NOTE — Discharge Instructions (Signed)
2 puffs every 4 hours with your inhaler Prednisone daily for 5 days  Please obtain all of your results from medical records or have your doctors office obtain the results - share them with your doctor - you should be seen at your doctors office in the next 2 days. Call today to arrange your follow up. Take the medications as prescribed. Please review all of the medicines and only take them if you do not have an allergy to them. Please be aware that if you are taking birth control pills, taking other prescriptions, ESPECIALLY ANTIBIOTICS may make the birth control ineffective - if this is the case, either do not engage in sexual activity or use alternative methods of birth control such as condoms until you have finished the medicine and your family doctor says it is OK to restart them. If you are on a blood thinner such as COUMADIN, be aware that any other medicine that you take may cause the coumadin to either work too much, or not enough - you should have your coumadin level rechecked in next 7 days if this is the case.  ?  It is also a possibility that you have an allergic reaction to any of the medicines that you have been prescribed - Everybody reacts differently to medications and while MOST people have no trouble with most medicines, you may have a reaction such as nausea, vomiting, rash, swelling, shortness of breath. If this is the case, please stop taking the medicine immediately and contact your physician.  ?  You should return to the ER if you develop severe or worsening symptoms.    Emergency Department Resource Guide 1) Find a Doctor and Pay Out of Pocket Although you won't have to find out who is covered by your insurance plan, it is a good idea to ask around and get recommendations. You will then need to call the office and see if the doctor you have chosen will accept you as a new patient and what types of options they offer for patients who are self-pay. Some doctors offer discounts or  will set up payment plans for their patients who do not have insurance, but you will need to ask so you aren't surprised when you get to your appointment.  2) Contact Your Local Health Department Not all health departments have doctors that can see patients for sick visits, but many do, so it is worth a call to see if yours does. If you don't know where your local health department is, you can check in your phone book. The CDC also has a tool to help you locate your state's health department, and many state websites also have listings of all of their local health departments.  3) Find a [facility] [place] [facility] If your illness is not likely to be very severe or complicated, you may want to try a walk in clinic. These are popping up all over the country in pharmacies, drugstores, and shopping centers. They're usually staffed by nurse practitioners or physician assistants that have been trained to treat common illnesses and complaints. They're usually fairly quick and inexpensive. However, if you have serious medical issues or chronic medical problems, these are probably not your best option.  No Primary Care Doctor: - Call Health Connect at  [phone] - they can help you locate a primary care doctor that  accepts your insurance, provides certain services, etc. - Physician Referral Service- [phone]  Chronic Pain Problems: Organization  Address  Phone   Notes  [facility]  [phone] Patients need to be referred by their primary care doctor.   Medication Assistance: Organization         Address  Phone   Notes  [facility] [address] [place], [place] [address] [phone] --Must be a resident of [facility] -- Must have NO insurance coverage whatsoever (no Medicaid/ Medicare, etc.) -- The pt. MUST have a primary care doctor that directs their care regularly and follows them in the community   MedAssist  [phone]   [employer]  [phone]    Agencies that provide inexpensive medical care: Organization         Address  Phone   Notes  [place]  [phone]   [facility]    [phone]   [facility] [place], [place] [address] [phone]   [place] [address] [place] [phone]   Planned Parenthood    [phone]   [facility]    [phone]   [place] and [place] [address], [place] Phone:  [phone], Fax:  [phone] Hours of Operation:  9 am - 6 pm, M-F.  Also accepts Medicaid/Medicare and self-pay.  [facility] for [place] [address], [address], [place] Phone: [phone], Fax: [phone]. Hours of Operation:  8:30 am - 5:30 pm, M-F.  Also accepts Medicaid and self-pay.  [facility] [address], [place] Phone: [phone]   [place], [place], [place] [phone], Ext. [phone] Mondays & Thursdays: 7-9 AM.  First 15 patients are seen on a first come, first serve basis.    [place] Providers:  Organization         Address  Phone   Notes  [facility] [address], [place] [phone] Also accepts self-pay patients.  [facility] [address], [place]  [phone]   [place], [address], [place] [phone]   [facility] [address], [place] [phone]   Lucianne Lei [address], [place]   [phone] Only accepts [place] Access [place] patients after they have their name applied to their card.   Self-Pay (no insurance) in [facility]:  Organization         Address  Phone   Notes  Sickle Cell Patients, [facility] [place] [phone]   [facility] [place] [phone]   [facility] [place]  [place], [place], [place] [phone]   [facility]/Dr. Osei-Bonsu  [address] [place] or [address], [place] [phone] Phone number for both [place] and [place] locations is the same.  [facility] [address] [place] [phone]   [facility] [address], [place] or [address] [phone]   [facility] [address], [place] [phone], phone; [phone], fax Sees patients 1st and 3rd Saturday of every month.  Must not qualify  for public or private insurance (i.e. Medicaid, Medicare, [place] Health Choice, Veterans' Benefits)  Household income should be no more than 200% of the poverty level The clinic cannot treat you if you are pregnant or think you are pregnant  Sexually transmitted diseases are not treated at the clinic.    Dental Care: Organization         Address  Phone  Notes  [facility] [place] [phone] Accepts children up to age 29 who are enrolled in [place] or [place]; pregnant women with a Medicaid card; and children who have applied for Medicaid or [place] Health Choice, but were declined, whose parents can pay a reduced fee at time of service.  [facility]  [address], [place] [phone] Accepts children up to age 49 who are enrolled in [place] or [place]; pregnant women with a Medicaid card; and children who have applied for Medicaid or [place] Health Choice, but were declined, whose parents can pay a reduced fee at time of service.  [place] Adult Dental Access PROGRAM  [place] [phone] Patients are seen by appointment only. Walk-ins are not accepted. [place] will see patients 73 years of age and older. Monday - Tuesday (8am-5pm) Most Wednesdays (8:30-5pm) $30 per visit, cash only  [facility] Adult [facility] Access PROGRAM  [address], [facility] [phone] Patients are seen by appointment only. Walk-ins are not accepted. [place] will see patients 14 years of age and older. One Wednesday Evening (Monthly: Volunteer Based).  $30 per visit, cash only  [place]  [phone] for adults; Children under age 24, call Graduate Pediatric Dentistry at [phone]. Children aged 20-14, please call [phone] to request a pediatric application.  Dental services are provided in all areas of dental care including fillings, crowns and bridges, complete and partial dentures, implants, gum treatment, root canals, and extractions. Preventive care is also provided. Treatment is provided to both adults and children. Patients are selected via a lottery and there is often a waiting list.   [facility] [address], [place]  [phone] [email or web address]   [facility] [address] [place], [place] [phone], Ext. [phone] Second and Fourth Thursday of each month, opens at 6:30 AM; Clinic ends at 9 AM.  Patients are seen on a first-come first-served basis, and a limited number are seen during each clinic.   [facility]  [address] Hillard Danker [place], [place] [phone]   Eligibility Requirements You must have lived in [place], [place], or [place] counties for at least the last three months.   You cannot be eligible for state or federal sponsored [employer], including [employer], [place], or [employer].   You generally cannot be eligible for healthcare insurance through your employer.    How to apply: Eligibility screenings are held every Tuesday and Wednesday afternoon  from 1:00 pm until 4:00 pm. You do not need an appointment for the interview!  [facility] [address] [place], [place]   [place] Department  [place]  [phone]    Behavioral Health Resources in the Community: Intensive Outpatient Programs Organization         Address  Phone  Notes  [facility] [address], [place], [place] [phone]   [facility] [address], [place], [place]   ADS: Alcohol & Drug Svcs [address] [place], [place]   [facility] [address]  [place], [place] or [phone]   Substance Abuse Resources Organization         Address  Phone  Notes  Alcohol and Drug Services  [phone]   [place]  [phone]   [facility] [place]   Chinita Pester  [phone]   Residential & Outpatient Substance Abuse Program  [phone]   Psychological Services Organization         Address  Phone  Notes  [facility] [place]  [place]  [phone]   [place] [address] [place] or [phone]    Mobile Crisis Teams Organization         Address  Phone  Notes  [facility], Mobile Crisis Care Unit  [phone]   [facility] Psychotherapeutic Services  [address] [place], [place]   [place] [address] [place] [phone]    Self-Help/Support Groups Organization         Address  Phone             Notes  [place]. of [place] - variety of support groups  [place] Call for more information  Narcotics Anonymous (NA), [facility] [address], [employer] [place]  2 meetings at this location   Materials engineer         Address  Phone  Notes  [facility] Residential Treatment [place],    [place]  [phone]   [facility]  [address] [place] [id number], [place], [place]   [place], [place] [phone] Admissions: 8am-3pm M-F  Incentives Substance [place] [address],    [place], [place] [phone]   [facility] [address] [place], [place], [place]   [facility] [address],  [place], [place]   [facility] Programs - Intensive Outpatient [address]een [address], [place], [place]   [facility] [place].,  [place], [place] [phone] or [phone]   [facility] Services (RTS) [address] [place], [place] Accepts Medicaid  [facility] [place] [address],  [place] [phone] Substance Abuse/Addiction Treatment   [facility] Organization         Address  Phone  Notes  [facility]  [phone]   Domenic Schwab, PhD [address] Arlis Port[place], [place]   [phone] or [phone]   [facility]   [address] [place] [employer], [place] [phone]   [facility] [address], [place], [place] [phone] Insurance/Medicaid/sponsorship through [facility] and Families [address] [place]                                    [place], [place] [phone] [place] [address], [place] [phone]    Dr. Adele Schilder  [phone]   [facility] [place]  [facility]. 1) [address], [place] 2) [place] 3)  [place] [address], [place] [phone]  [phone]   [place] [phone] or [phone] (After Hours)

## 2015-02-01 NOTE — ED Notes (Signed)
Gave pt sandwich, crackers, and sprite, per Dr. Sabra Heck - MD

## 2015-02-01 NOTE — ED Notes (Signed)
Discharge instructions/prescription reviewed with patient. Understanding verbalized. No acute distress noted. Patient declined wheelchair at time of discharge.

## 2015-02-07 ENCOUNTER — Ambulatory Visit: Payer: Commercial Managed Care - HMO | Admitting: Physician Assistant

## 2015-02-11 ENCOUNTER — Other Ambulatory Visit: Payer: Self-pay | Admitting: *Deleted

## 2015-02-11 ENCOUNTER — Inpatient Hospital Stay (HOSPITAL_COMMUNITY)
Admission: EM | Admit: 2015-02-11 | Discharge: 2015-02-13 | DRG: 191 | Disposition: A | Discharge status: Home / Self Care | Payer: Commercial Managed Care - HMO | Attending: Family Medicine | Admitting: Family Medicine

## 2015-02-11 ENCOUNTER — Emergency Department (HOSPITAL_COMMUNITY): Payer: Commercial Managed Care - HMO

## 2015-02-11 ENCOUNTER — Encounter (HOSPITAL_COMMUNITY): Payer: Self-pay | Admitting: Emergency Medicine

## 2015-02-11 DIAGNOSIS — J45909 Unspecified asthma, uncomplicated: Secondary | ICD-10-CM | POA: Diagnosis present

## 2015-02-11 DIAGNOSIS — J449 Chronic obstructive pulmonary disease, unspecified: Secondary | ICD-10-CM | POA: Diagnosis not present

## 2015-02-11 DIAGNOSIS — C9000 Multiple myeloma not having achieved remission: Secondary | ICD-10-CM | POA: Diagnosis present

## 2015-02-11 DIAGNOSIS — K219 Gastro-esophageal reflux disease without esophagitis: Secondary | ICD-10-CM | POA: Diagnosis present

## 2015-02-11 DIAGNOSIS — I5022 Chronic systolic (congestive) heart failure: Secondary | ICD-10-CM | POA: Diagnosis present

## 2015-02-11 DIAGNOSIS — N400 Enlarged prostate without lower urinary tract symptoms: Secondary | ICD-10-CM | POA: Diagnosis present

## 2015-02-11 DIAGNOSIS — J441 Chronic obstructive pulmonary disease with (acute) exacerbation: Principal | ICD-10-CM | POA: Diagnosis present

## 2015-02-11 DIAGNOSIS — R0602 Shortness of breath: Secondary | ICD-10-CM | POA: Diagnosis present

## 2015-02-11 DIAGNOSIS — Z87891 Personal history of nicotine dependence: Secondary | ICD-10-CM

## 2015-02-11 DIAGNOSIS — I110 Hypertensive heart disease with heart failure: Secondary | ICD-10-CM | POA: Diagnosis present

## 2015-02-11 DIAGNOSIS — Z7952 Long term (current) use of systemic steroids: Secondary | ICD-10-CM | POA: Diagnosis not present

## 2015-02-11 DIAGNOSIS — Z7982 Long term (current) use of aspirin: Secondary | ICD-10-CM | POA: Diagnosis not present

## 2015-02-11 HISTORY — DX: Chronic obstructive pulmonary disease, unspecified: J44.9

## 2015-02-11 HISTORY — DX: Heart failure, unspecified: I50.9

## 2015-02-11 LAB — COMPREHENSIVE METABOLIC PANEL
ALK PHOS: 66 U/L (ref 38–126)
ALT: 16 U/L — AB (ref 17–63)
ANION GAP: 8 (ref 5–15)
AST: 26 U/L (ref 15–41)
Albumin: 4.1 g/dL (ref 3.5–5.0)
BILIRUBIN TOTAL: 0.4 mg/dL (ref 0.3–1.2)
BUN: 9 mg/dL (ref 6–20)
CALCIUM: 9.4 mg/dL (ref 8.9–10.3)
CO2: 32 mmol/L (ref 22–32)
CREATININE: 1.03 mg/dL (ref 0.61–1.24)
Chloride: 93 mmol/L — ABNORMAL LOW (ref 101–111)
GFR calc non Af Amer: >60 mL/min (ref >60)
Glucose, Bld: 117 mg/dL — ABNORMAL HIGH (ref 65–99)
Potassium: 4.1 mmol/L (ref 3.5–5.1)
Sodium: 133 mmol/L — ABNORMAL LOW (ref 135–145)
TOTAL PROTEIN: 7.2 g/dL (ref 6.5–8.1)

## 2015-02-11 LAB — CBC WITH DIFFERENTIAL/PLATELET
BASOS ABS: 0 10*3/uL (ref 0.0–0.1)
Basophils Relative: 0 %
EOS ABS: 0.1 10*3/uL (ref 0.0–0.7)
Eosinophils Relative: 2 %
HEMATOCRIT: 34.8 % — AB (ref 39.0–52.0)
HEMOGLOBIN: 11.3 g/dL — AB (ref 13.0–17.0)
Lymphocytes Relative: 43 %
Lymphs Abs: 3.3 10*3/uL (ref 0.7–4.0)
MCH: 32.1 pg (ref 26.0–34.0)
MCHC: 32.5 g/dL (ref 30.0–36.0)
MCV: 98.9 fL (ref 78.0–100.0)
MONO ABS: 0.9 10*3/uL (ref 0.1–1.0)
MONOS PCT: 12 %
NEUTROS ABS: 3.2 10*3/uL (ref 1.7–7.7)
NEUTROS PCT: 43 %
Platelets: 235 10*3/uL (ref 150–400)
RBC: 3.52 MIL/uL — ABNORMAL LOW (ref 4.22–5.81)
RDW: 13.8 % (ref 11.5–15.5)
WBC: 7.5 10*3/uL (ref 4.0–10.5)

## 2015-02-11 LAB — I-STAT ARTERIAL BLOOD GAS, ED
Acid-Base Excess: 10 mmol/L — ABNORMAL HIGH (ref 0.0–2.0)
Bicarbonate: 37.7 mEq/L — ABNORMAL HIGH (ref 20.0–24.0)
O2 SAT: 99 %
PH ART: 7.391 (ref 7.350–7.450)
TCO2: 40 mmol/L (ref 0–100)
pCO2 arterial: 62.2 mmHg (ref 35.0–45.0)
pO2, Arterial: 140 mmHg — ABNORMAL HIGH (ref 80.0–100.0)

## 2015-02-11 LAB — I-STAT TROPONIN, ED: TROPONIN I, POC: 0.05 ng/mL (ref 0.00–0.08)

## 2015-02-11 LAB — BRAIN NATRIURETIC PEPTIDE: B NATRIURETIC PEPTIDE 5: 580.9 pg/mL — AB (ref 0.0–100.0)

## 2015-02-11 MED ORDER — NITROGLYCERIN 0.4 MG SL SUBL
0.4000 mg | SUBLINGUAL_TABLET | SUBLINGUAL | Status: DC | PRN
  Administered 2015-02-11: 0.4 mg via SUBLINGUAL

## 2015-02-11 MED ORDER — FUROSEMIDE 10 MG/ML IJ SOLN
80.0000 mg | Freq: Once | INTRAMUSCULAR | Status: AC
  Administered 2015-02-11: 80 mg via INTRAVENOUS

## 2015-02-11 MED ORDER — FUROSEMIDE 10 MG/ML IJ SOLN: 80.0000 mg | Freq: Once | INTRAMUSCULAR | Status: DC

## 2015-02-11 MED ORDER — NITROGLYCERIN 2 % TD OINT
1.0000 [in_us] | TOPICAL_OINTMENT | Freq: Once | TRANSDERMAL | Status: DC
  Filled 2015-02-11: qty 1

## 2015-02-11 NOTE — ED Notes (Addendum)
Provider ordered nitro ointment, pressure returned to acceptable, verbally instructed RN to hold applying past.

## 2015-02-11 NOTE — ED Notes (Signed)
Pt became SOB today, When EMS arrived he was struggling but the struggling has continued to worsen.  He was given 125 Solmed in route and 10 albuterol.  Marland Kitchen

## 2015-02-11 NOTE — ED Provider Notes (Signed)
CSN: [id number]     Arrival date & time 02/11/15  2107 History   First MD Initiated Contact with Patient 02/11/15 2112     Chief Complaint  Patient presents with  . Shortness of Breath      Patient is a 74 y.o. male presenting with shortness of breath.  Shortness of Breath  Pt became SOB today, When EMS arrived he was struggling but the struggling has continued to worsen. He was given 125 Solmed in route and 10 albuterol. Past Medical History  Diagnosis Date  . Hypertension   . Leukocytopenia   . Asthma   . Abdominal pain, chronic, generalized   . GERD (gastroesophageal reflux disease)   . BPH (benign prostatic hyperplasia)   . 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 Shortness of breath    Past Surgical History  Procedure Laterality Date  . Video bronchoscopy Bilateral 10/28/2012    Procedure: VIDEO BRONCHOSCOPY WITHOUT FLUORO;  Surgeon: Rigoberto Noel, MD;  Location: [facility];  Service: Cardiopulmonary;  Laterality: Bilateral;  . Penile prosthesis implant  1997  . Left and right heart catheterization with coronary angiogram N/A 07/12/2014    Procedure: LEFT AND RIGHT HEART CATHETERIZATION WITH CORONARY ANGIOGRAM;  Surgeon: Burnell Blanks, MD;  Location: [facility] CATH LAB;  Service: Cardiovascular;  Laterality: N/A;   Family History  Problem Relation Age of Onset  . Asthma Sister   . Healthy Brother   . Healthy Brother   . Healthy Brother   . Alcoholism Father   . Stroke Mother   . Healthy Sister   . Healthy Sister   . Healthy Sister    Social History  Substance Use Topics  . Smoking status: Former Smoker -- 0.00 packs/day for 0 years    Quit date: 11/08/2009  . Smokeless tobacco: Never Used  . Alcohol Use: 3.6 oz/week    6 Cans of beer per week     Comment: ocasional on weekends    Review of Systems  Respiratory: Positive for shortness of breath.   All other systems reviewed and are negative.     Allergies  Review of patient's allergies indicates no  known allergies.  Home Medications   Prior to Admission medications   Medication Sig Start Date End Date Taking? Authorizing Provider  albuterol (PROVENTIL HFA;VENTOLIN HFA) 108 (90 BASE) MCG/ACT inhaler Inhale 2 puffs into the lungs every 6 (six) hours as needed for wheezing or shortness of breath. 09/14/14  Yes Elberta Leatherwood, MD  albuterol (PROVENTIL) (2.5 MG/3ML) 0.083% nebulizer solution Take 3 mLs (2.5 mg total) by nebulization every 4 (four) hours as needed for wheezing or shortness of breath (dx J44.9). 07/19/14  Yes Rigoberto Noel, MD  amLODipine (NORVASC) 10 MG tablet Take 1 tablet (10 mg total) by mouth daily. 12/21/14  Yes Brittainy Erie Noe, PA-C  aspirin EC 81 MG EC tablet Take 1 tablet (81 mg total) by mouth daily. 07/13/14  Yes Elberta Leatherwood, MD  esomeprazole (NEXIUM) 40 MG capsule TAKE 1 CAPSULE BY MOUTH EVERY MORNING BEFORE A MEAL 12/20/14  Yes Rigoberto Noel, MD  fluticasone (FLONASE) 50 MCG/ACT nasal spray SHAKE WELL AND USE 2 SPRAYS IN EACH NOSTRIL DAILY 12/04/14  Yes Elberta Leatherwood, MD  gabapentin (NEURONTIN) 300 MG capsule Take 300 mg by mouth 3 (three) times daily.     Yes Historical Provider, MD  losartan (COZAAR) 100 MG tablet Take 1 tablet (100 mg total) by mouth daily. 12/21/14  Yes Brittainy Erie Noe,  PA-C  pentosan polysulfate (ELMIRON) 100 MG capsule Take 100 mg by mouth 3 (three) times daily before meals.    Yes Historical Provider, MD  predniSONE (DELTASONE) 20 MG tablet Take 3 tablets (60 mg total) by mouth daily. 02/01/15  Yes Noemi Chapel, MD  tadalafil (CIALIS) 20 MG tablet Take 0.5-1 tablets (10-20 mg total) by mouth every other day as needed for erectile dysfunction. 01/01/15  Yes Elberta Leatherwood, MD  Tamsulosin HCl (FLOMAX) 0.4 MG CAPS Take 0.4 mg by mouth daily.    Yes Historical Provider, MD  tiotropium (SPIRIVA HANDIHALER) 18 MCG inhalation capsule Place 1 capsule (18 mcg total) into inhaler and inhale daily. 01/17/15  Yes Leeanne Rio, MD  venlafaxine XR (EFFEXOR-XR)  150 MG 24 hr capsule TAKE 1 CAPSULE(150 MG) BY MOUTH DAILY 12/25/14  Yes Elberta Leatherwood, MD   BP 133/92 mmHg  Pulse 91  Temp(Src) 99 F (37.2 C) (Oral)  Resp 17  Ht 6\' 1"  (1.854 m)  Wt 175 lb (79.379 kg)  BMI 23.09 kg/m2  SpO2 100% Physical Exam  Constitutional: He is oriented to person, place, and time. He appears well-developed and well-nourished. He appears distressed.  HENT:  Head: Normocephalic and atraumatic.  Eyes: Pupils are equal, round, and reactive to light.  Neck: Normal range of motion.  Cardiovascular: Normal rate and intact distal pulses.   Pulmonary/Chest: Accessory muscle usage present. Tachypnea noted. He is in respiratory distress. He has decreased breath sounds. He has wheezes. He has rhonchi. He has rales.  Abdominal: Normal appearance. He exhibits no distension. There is no tenderness. There is no rebound.  Musculoskeletal: Normal range of motion.  Neurological: He is alert and oriented to person, place, and time. No cranial nerve deficit.  Skin: Skin is warm and dry. No rash noted.  Psychiatric: He has a normal mood and affect. His behavior is normal.  Nursing note and vitals reviewed.   ED Course  Procedures (including critical care time) Medications  nitroGLYCERIN (NITROSTAT) SL tablet 0.4 mg (0.4 mg Sublingual Given 02/11/15 2122)  nitroGLYCERIN (NITROGLYN) 2 % ointment 1 inch (not administered)  furosemide (LASIX) injection 80 mg (80 mg Intravenous Given 02/11/15 2122)    Labs Review Labs Reviewed  COMPREHENSIVE METABOLIC PANEL - Abnormal; Notable for the following:    Sodium 133 (*)    Chloride 93 (*)    Glucose, Bld 117 (*)    ALT 16 (*)    All other components within normal limits  CBC WITH DIFFERENTIAL/PLATELET - Abnormal; Notable for the following:    RBC 3.52 (*)    Hemoglobin 11.3 (*)    HCT 34.8 (*)    All other components within normal limits  BRAIN NATRIURETIC PEPTIDE - Abnormal; Notable for the following:    B Natriuretic Peptide  580.9 (*)    All other components within normal limits  I-STAT ARTERIAL BLOOD GAS, ED - Abnormal; Notable for the following:    pCO2 arterial 62.2 (*)    pO2, Arterial 140.0 (*)    Bicarbonate 37.7 (*)    Acid-Base Excess 10.0 (*)    All other components within normal limits  I-STAT TROPOININ, ED    Imaging Review Dg Chest Portable 1 View  02/11/2015  CLINICAL DATA:  Shortness of breath. EXAM: PORTABLE CHEST 1 VIEW COMPARISON:  01/31/2015 FINDINGS: Heart size is upper limits of normal but may be accentuated by the portable technique. There are subtle densities in the right upper lung which are similar to the  previous examination and may represent chronic changes. Upper lungs are clear. The trachea is midline. IMPRESSION: Chronic subtle densities in the right lung. Findings are probably related to chronic changes. No acute findings. Electronically Signed   By: Markus Daft M.D.   On: 02/11/2015 21:29   I have personally reviewed and evaluated these images and lab results as part of my medical decision-making.   EKG Interpretation   Date/Time:  Monday February 11 2015 21:11:14 EDT Ventricular Rate:  117 PR Interval:  135 QRS Duration: 127 QT Interval:  343 QTC Calculation: 478 R Axis:   -101 Text Interpretation:  Sinus tachycardia Ventricular bigeminy RBBB and LAFB  Minimal ST elevation, lateral leads Confirmed by Leyana Whidden  MD, Lashante Fryberger  ([id number]) on 02/11/2015 10:12:35 PM     CRITICAL CARE Performed by: Leonard Schwartz L Total critical care time: 45 min Critical care time was exclusive of separately billable procedures and treating other patients. Critical care was necessary to treat or prevent imminent or life-threatening deterioration. Critical care was time spent personally by me on the following activities: development of treatment plan with patient and/or surrogate as well as nursing, discussions with consultants, evaluation of patient's response to treatment, examination of patient,  obtaining history from patient or surrogate, ordering and performing treatments and interventions, ordering and review of laboratory studies, ordering and review of radiographic studies, pulse oximetry and re-evaluation of patient's condition.  MDM   Final diagnoses:  Chronic obstructive pulmonary disease, unspecified COPD type (HCC)        Leonard Schwartz, MD 02/11/15 2340

## 2015-02-11 NOTE — H&P (Signed)
[facility] Admission History and Physical Service Pager: [phone]  Patient name: Steven Beasley Medical record number: [id number] Date of birth: [birth year] Age: 74 y.o. Gender: male  Primary Care Provider: Georges Lynch, MD Consultants: card Code Status: full  Chief Complaint: shortness of breath.  Assessment and Plan: Steven Beasley is a 74 y.o. male presenting with shortness of breath. PMH is significant for COPD, CHF (EF 30-35%), HYPERTENSION, EtOH use & GERD   Shortness of breath: Sudden onset of shortness of breath with diaphoresis concerning for arrhythmia/ACS. EKG here with multiple PVC's and some ischemic changes on lateral leads although the ST elevations are less than 2 boxes. 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 Troponin 0.04 and trended to 0.05. HEART score 4. COPD exacerbation another possibility given rhonchi and diminished aeration on lung exam. His symptoms have also improved on BiPAP. BNP elevated to 580 but chorincaly elevated. Weight up by 5-10 lbs. Trace edema bilaterally. However, he appears euvolumic with no crackles and pulmonary edema on CXR. Unlikely to be pneumonia without fever and leokocytosis. Unlikely to be PE without tachycardia, tachypnea, pleuritic chest pain; wells score 1. Status post 80 mg IV lasix and good UOP. S/p 125 mg IV Solu-Medrol and albuterol by EMS. S/ -Admit step down. Attending Dr. Erin Hearing -ASA -Cycle trops (poc of trop 0.04>0.05) -Nitroglycernin as needed for chest pain -Cardiac monitoring -Daily weights -Strict I's & O's -Wean O2 as tolerated to keep O2 sat above 90% - EKG in the morning  HFrEF: Last ECHO (06/2014): EF 30-35%, possible moderate hypokinesis of the inferior myocardium, LA and RA mildly dilated, PA peak pressure 46 mmHg. BNP elevated to 580 but chorincaly elevated. Weight up by 5-10 lbs. Trace edema bilaterally. However, he appears euvolimic with no crackles and pulmonary edema on CXR.  Status post 80 mg IV lasix and good UOP - Continue home med: Cozaar 100mg  qd - Consider additional lasix doses if no improvement - Daily weights. Baseline weight 159lb at last office visit on 9/13. No weights reported this hospitalization for comparison.  Gold D COPD: PFT in 06/2014 FEV1% 38, DLCO 37%. Rhonchi and diminished aeration on lung exam. No increased sputum. ABG 7.39/62/140/38. -S/p 125 mg IV Solu-Medrol by EMS.  - Prednisone 50 mg daily for 4 more days - Hold off antibiotics given no increased sputum production and recent course of doxy - Wean off BiPAP as tolerated. - Duonebs every 4 hours, with PRN albuterol nebs - Oxygen as needed  HTN- Stable - Continue home meds: Norvasc 10mg  qd, Cozaar 100mg  qd  GERD - Protonix 80mg  qd  BPH: - Continue home med: Flomax 0.4mg  qd  Prolonged QTc: QTc to 521 about two weeks ago. 480 now. - Caution with medications known to prolong QT.  - Follow up am EKG  History of EtOH use: last drink about three days ago.  -CIWA protocol -Folic acid and Thiamine  FEN/GI:  -NPO while on BiPAP -1/2MIVF  Prophylaxis: Lovonex  Disposition: Step down while on BiPAP  History of Present Illness:  Steven Beasley is a 74 y.o. male presenting with dyspnea.   Symptoms started earlier this afternoon about 3 pm while he was sitting down in his living room. He tried using his inhaler and nebulizer which did not help. He reports some diaphoresis at the time of dyspnea. He reports having some clear phlegm production with no associated cough or chest pain. No fever, chills, nausea, vomiting, lightheadedness. Denies chest pain although  he complained to chest pain to ED providers. No dysuria. No recent weight gain. He is generally able to walk about 3 blocks, which has remained unchanged. He reports sleeping on two pillows for comfort only.  Reports drinking alcohol about a quart. Last drink three days ago. Denies smoking cigarette or recreational drug  use.  ED course: he was given 80 mg Lasix x 1 and an hour long nebulizer treatment through BiPAP  Review Of Systems: Per HPI Otherwise 12 point review of systems was performed and was unremarkable.  Patient Active Problem List   Diagnosis Date Noted  . SOB (shortness of breath) 02/12/2015  . CHF exacerbation ([place])   . Acute on chronic systolic heart failure ([place])   . Knee pain 01/01/2015  . Erectile dysfunction 09/28/2014  . Resting tremor 09/28/2014  . Involuntary movements on examination 07/20/2014  . Hyponatremia   . Chronic systolic heart failure ([place])   . COPD exacerbation ([place]) 07/07/2014  . COPD (chronic obstructive pulmonary disease) ([place]) 12/13/2012  . 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 Leukocytopenia   . Abdominal pain, chronic, generalized   . GERD (gastroesophageal reflux disease)   . BPH (benign prostatic hyperplasia)   . UNDERWEIGHT 10/01/2009   Past Medical History: Past Medical History  Diagnosis Date  . Hypertension   . Leukocytopenia   . Asthma   . Abdominal pain, chronic, generalized   . GERD (gastroesophageal reflux disease)   . BPH (benign prostatic hyperplasia)   . 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 Shortness of breath    Past Surgical History: Past Surgical History  Procedure Laterality Date  . Video bronchoscopy Bilateral 10/28/2012    Procedure: VIDEO BRONCHOSCOPY WITHOUT FLUORO;  Surgeon: Rigoberto Noel, MD;  Location: [facility];  Service: Cardiopulmonary;  Laterality: Bilateral;  . Penile prosthesis implant  1997  . Left and right heart catheterization with coronary angiogram N/A 07/12/2014    Procedure: LEFT AND RIGHT HEART CATHETERIZATION WITH CORONARY ANGIOGRAM;  Surgeon: Burnell Blanks, MD;  Location: [facility] CATH LAB;  Service: Cardiovascular;  Laterality: N/A;   Social History: Social History  Substance Use Topics  . Smoking status: Former Smoker -- 0.00 packs/day for 0 years    Quit date: 11/08/2009  . Smokeless tobacco:  Never Used  . Alcohol Use: 3.6 oz/week    6 Cans of beer per week     Comment: ocasional on weekends   Additional social history: none Please also refer to relevant sections of EMR.  Family History: Family History  Problem Relation Age of Onset  . Asthma Sister   . Healthy Brother   . Healthy Brother   . Healthy Brother   . Alcoholism Father   . Stroke Mother   . Healthy Sister   . Healthy Sister   . Healthy Sister    Allergies and Medications: No Known Allergies No current facility-administered medications on file prior to encounter.   Current Outpatient Prescriptions on File Prior to Encounter  Medication Sig Dispense Refill  . albuterol (PROVENTIL HFA;VENTOLIN HFA) 108 (90 BASE) MCG/ACT inhaler Inhale 2 puffs into the lungs every 6 (six) hours as needed for wheezing or shortness of breath. 1 Inhaler 6  . albuterol (PROVENTIL) (2.5 MG/3ML) 0.083% nebulizer solution Take 3 mLs (2.5 mg total) by nebulization every 4 (four) hours as needed for wheezing or shortness of breath (dx J44.9). 360 vial 6  . amLODipine (NORVASC) 10 MG tablet Take 1 tablet (10 mg total) by mouth daily. 30 tablet 5  .  aspirin EC 81 MG EC tablet Take 1 tablet (81 mg total) by mouth daily. 30 tablet 0  . esomeprazole (NEXIUM) 40 MG capsule TAKE 1 CAPSULE BY MOUTH EVERY MORNING BEFORE A MEAL 90 capsule 0  . fluticasone (FLONASE) 50 MCG/ACT nasal spray SHAKE WELL AND USE 2 SPRAYS IN EACH NOSTRIL DAILY 16 g 2  . gabapentin (NEURONTIN) 300 MG capsule Take 300 mg by mouth 3 (three) times daily.      Marland Kitchen losartan (COZAAR) 100 MG tablet Take 1 tablet (100 mg total) by mouth daily. 30 tablet 5  . pentosan polysulfate (ELMIRON) 100 MG capsule Take 100 mg by mouth 3 (three) times daily before meals.     . predniSONE (DELTASONE) 20 MG tablet Take 3 tablets (60 mg total) by mouth daily. 15 tablet 0  . tadalafil (CIALIS) 20 MG tablet Take 0.5-1 tablets (10-20 mg total) by mouth every other day as needed for erectile  dysfunction. 5 tablet 11  . Tamsulosin HCl (FLOMAX) 0.4 MG CAPS Take 0.4 mg by mouth daily.     Marland Kitchen tiotropium (SPIRIVA HANDIHALER) 18 MCG inhalation capsule Place 1 capsule (18 mcg total) into inhaler and inhale daily. 30 capsule 2  . venlafaxine XR (EFFEXOR-XR) 150 MG 24 hr capsule TAKE 1 CAPSULE(150 MG) BY MOUTH DAILY 90 capsule 3    Objective: BP 126/71 mmHg  Pulse 25  Temp(Src) 98 F (36.7 C) (Oral)  Resp 17  Ht 6\' 1"  (1.854 m)  Wt 175 lb (79.379 kg)  BMI 23.09 kg/m2  SpO2 99% Exam: Gen: sitting in bed with BiPAP in place, some distress from breathing Eyes: PERRLA, sclera anicteric Nares: clear, no erythema, swelling or congestion Oropharynx: clear, moist CV: irregularly irregular, hard to hear with BiPAP in place, 2+ radial and DP pulses bilaterally. Resp: increased work of breathing, BiPAP in place, rhonchi bilaterally, diminished breath sounds bilaterally Abd: BS+, hyper-resonant to percussion, NDNT, no rebound or guarding.  Ext: trace, edema or gross deformities. Neuro: Alert and oriented, No gross focal deficits  Labs and Imaging: CBC BMET   Recent Labs Lab 02/11/15 2120  WBC 7.5  HGB 11.3*  HCT 34.8*  PLT 235    Recent Labs Lab 02/11/15 2120  NA 133*  K 4.1  CL 93*  CO2 32  BUN 9  CREATININE 1.03  GLUCOSE 117*  CALCIUM 9.4      Mercy Riding, MD 02/12/2015, 2:53 AM PGY-1, [facility] Intern pager: [phone], text pages welcome  I have seen and examined the patient. I have read and agree with the above note. My changes are noted in blue.  Cordelia Poche, MD PGY-3, [facility] 02/12/2015, 7:16 AM

## 2015-02-12 ENCOUNTER — Encounter (HOSPITAL_COMMUNITY): Payer: Self-pay | Admitting: General Practice

## 2015-02-12 DIAGNOSIS — R0602 Shortness of breath: Secondary | ICD-10-CM

## 2015-02-12 DIAGNOSIS — J449 Chronic obstructive pulmonary disease, unspecified: Secondary | ICD-10-CM

## 2015-02-12 LAB — BASIC METABOLIC PANEL
ANION GAP: 11 (ref 5–15)
BUN: 12 mg/dL (ref 6–20)
CALCIUM: 9.7 mg/dL (ref 8.9–10.3)
CO2: 33 mmol/L — AB (ref 22–32)
Chloride: 94 mmol/L — ABNORMAL LOW (ref 101–111)
Creatinine, Ser: 1.06 mg/dL (ref 0.61–1.24)
Glucose, Bld: 152 mg/dL — ABNORMAL HIGH (ref 65–99)
Potassium: 4.1 mmol/L (ref 3.5–5.1)
SODIUM: 138 mmol/L (ref 135–145)

## 2015-02-12 LAB — CBC
HCT: 35.8 % — ABNORMAL LOW (ref 39.0–52.0)
HEMOGLOBIN: 11.8 g/dL — AB (ref 13.0–17.0)
MCH: 32.2 pg (ref 26.0–34.0)
MCHC: 33 g/dL (ref 30.0–36.0)
MCV: 97.5 fL (ref 78.0–100.0)
Platelets: 245 10*3/uL (ref 150–400)
RBC: 3.67 MIL/uL — AB (ref 4.22–5.81)
RDW: 13.6 % (ref 11.5–15.5)
WBC: 9.5 10*3/uL (ref 4.0–10.5)

## 2015-02-12 LAB — TROPONIN I
TROPONIN I: 0.05 ng/mL — AB (ref <0.031)
TROPONIN I: 0.03 ng/mL (ref <0.031)
TROPONIN I: 0.03 ng/mL (ref <0.031)

## 2015-02-12 LAB — PROTIME-INR
INR: 1.04 (ref 0.00–1.49)
PROTHROMBIN TIME: 13.8 s (ref 11.6–15.2)

## 2015-02-12 LAB — MRSA PCR SCREENING: MRSA BY PCR: NEGATIVE

## 2015-02-12 MED ORDER — GABAPENTIN 300 MG PO CAPS
300.0000 mg | ORAL_CAPSULE | Freq: Three times a day (TID) | ORAL | Status: DC
  Administered 2015-02-12 – 2015-02-13 (×5): 300 mg via ORAL
  Filled 2015-02-12 (×5): qty 1

## 2015-02-12 MED ORDER — FOLIC ACID 5 MG/ML IJ SOLN
1.0000 mg | Freq: Every day | INTRAMUSCULAR | Status: DC
  Filled 2015-02-12: qty 0.2

## 2015-02-12 MED ORDER — VENLAFAXINE HCL ER 150 MG PO CP24: ORAL_CAPSULE | ORAL | Status: AC

## 2015-02-12 MED ORDER — THIAMINE HCL 100 MG/ML IJ SOLN: 100.0000 mg | Freq: Every day | INTRAMUSCULAR | Status: DC

## 2015-02-12 MED ORDER — FLUTICASONE PROPIONATE 50 MCG/ACT NA SUSP
1.0000 | Freq: Every day | NASAL | Status: DC
  Administered 2015-02-12 – 2015-02-13 (×2): 1 via NASAL
  Filled 2015-02-12: qty 16

## 2015-02-12 MED ORDER — DEXTROSE 5 % IV SOLN
2.0000 g | INTRAVENOUS | Status: DC
  Administered 2015-02-12: 2 g via INTRAVENOUS
  Filled 2015-02-12 (×2): qty 2

## 2015-02-12 MED ORDER — IPRATROPIUM-ALBUTEROL 0.5-2.5 (3) MG/3ML IN SOLN
3.0000 mL | Freq: Four times a day (QID) | RESPIRATORY_TRACT | Status: DC
  Administered 2015-02-13 (×2): 3 mL via RESPIRATORY_TRACT
  Filled 2015-02-12 (×3): qty 3

## 2015-02-12 MED ORDER — TAMSULOSIN HCL 0.4 MG PO CAPS
0.4000 mg | ORAL_CAPSULE | Freq: Every day | ORAL | Status: DC
  Administered 2015-02-12 – 2015-02-13 (×2): 0.4 mg via ORAL
  Filled 2015-02-12 (×2): qty 1

## 2015-02-12 MED ORDER — CETYLPYRIDINIUM CHLORIDE 0.05 % MT LIQD
7.0000 mL | Freq: Two times a day (BID) | OROMUCOSAL | Status: DC
  Administered 2015-02-12 – 2015-02-13 (×3): 7 mL via OROMUCOSAL

## 2015-02-12 MED ORDER — ASPIRIN EC 81 MG PO TBEC
81.0000 mg | DELAYED_RELEASE_TABLET | Freq: Every day | ORAL | Status: DC
  Administered 2015-02-12 – 2015-02-13 (×2): 81 mg via ORAL
  Filled 2015-02-12 (×2): qty 1

## 2015-02-12 MED ORDER — LORAZEPAM 2 MG/ML IJ SOLN
2.0000 mg | INTRAMUSCULAR | Status: DC | PRN
Start: 2015-02-12 — End: 2015-02-13

## 2015-02-12 MED ORDER — LOSARTAN POTASSIUM 50 MG PO TABS
100.0000 mg | ORAL_TABLET | Freq: Every day | ORAL | Status: DC
  Administered 2015-02-12 – 2015-02-13 (×2): 100 mg via ORAL
  Filled 2015-02-12 (×2): qty 2

## 2015-02-12 MED ORDER — FOLIC ACID 1 MG PO TABS
1.0000 mg | ORAL_TABLET | Freq: Every day | ORAL | Status: DC
  Administered 2015-02-12 – 2015-02-13 (×2): 1 mg via ORAL
  Filled 2015-02-12 (×2): qty 1

## 2015-02-12 MED ORDER — PREDNISONE 20 MG PO TABS
40.0000 mg | ORAL_TABLET | Freq: Every day | ORAL | Status: DC
  Administered 2015-02-12 – 2015-02-13 (×2): 40 mg via ORAL
  Filled 2015-02-12 (×2): qty 2

## 2015-02-12 MED ORDER — VENLAFAXINE HCL ER 150 MG PO CP24
150.0000 mg | ORAL_CAPSULE | Freq: Every day | ORAL | Status: DC
  Administered 2015-02-12 – 2015-02-13 (×2): 150 mg via ORAL
  Filled 2015-02-12 (×3): qty 1

## 2015-02-12 MED ORDER — SODIUM CHLORIDE 0.9 % IV SOLN
INTRAVENOUS | Status: DC
  Administered 2015-02-12: 03:00:00 via INTRAVENOUS

## 2015-02-12 MED ORDER — BUDESONIDE 0.5 MG/2ML IN SUSP
0.5000 mg | Freq: Two times a day (BID) | RESPIRATORY_TRACT | Status: DC
  Administered 2015-02-12 – 2015-02-13 (×2): 0.5 mg via RESPIRATORY_TRACT
  Filled 2015-02-12 (×2): qty 2

## 2015-02-12 MED ORDER — AMLODIPINE BESYLATE 10 MG PO TABS
10.0000 mg | ORAL_TABLET | Freq: Every day | ORAL | Status: DC
  Administered 2015-02-12 – 2015-02-13 (×2): 10 mg via ORAL
  Filled 2015-02-12 (×2): qty 1

## 2015-02-12 MED ORDER — SODIUM CHLORIDE 0.9 % IV SOLN: INTRAVENOUS | Status: AC

## 2015-02-12 MED ORDER — ALBUTEROL SULFATE (2.5 MG/3ML) 0.083% IN NEBU: 2.5000 mg | INHALATION_SOLUTION | RESPIRATORY_TRACT | Status: DC | PRN

## 2015-02-12 MED ORDER — IPRATROPIUM-ALBUTEROL 0.5-2.5 (3) MG/3ML IN SOLN
3.0000 mL | RESPIRATORY_TRACT | Status: DC
  Administered 2015-02-12 (×4): 3 mL via RESPIRATORY_TRACT
  Filled 2015-02-12 (×4): qty 3

## 2015-02-12 MED ORDER — VITAMIN B-1 100 MG PO TABS
100.0000 mg | ORAL_TABLET | Freq: Every day | ORAL | Status: DC
  Administered 2015-02-12 – 2015-02-13 (×2): 100 mg via ORAL
  Filled 2015-02-12 (×2): qty 1

## 2015-02-12 MED ORDER — ENOXAPARIN SODIUM 40 MG/0.4ML ~~LOC~~ SOLN
40.0000 mg | SUBCUTANEOUS | Status: DC
  Administered 2015-02-12: 40 mg via SUBCUTANEOUS
  Filled 2015-02-12: qty 0.4

## 2015-02-12 NOTE — Progress Notes (Signed)
Family Medicine Teaching Service Daily Progress Note Intern Pager: [phone]  Patient name: Steven Beasley Medical record number: [id number] Date of birth: 1940-06-07 Age: 74 y.o. Gender: male  Primary Care Provider: Georges Lynch, MD Consultants: none Code Status: full  Pt Overview and Major Events to Date:  10/10-admitted due to COPD exacerbation  Assessment and Plan: Steven Beasley is a 75 y.o. male presenting with shortness of breath. PMH is significant for COPD, CHF (EF 30-35%), HYPERTENSION, EtOH use & GERD   Shortness of breath likely 2/2 to copd exacerbation: Gold D COPD:  rhonchi and diminished aeration on lung exam on presentation. His dyspnea improved on BiPAP. PFT in 06/2014 FEV1% 38, DLCO 37%. Not on inhaled steroid at home. Rhonchi and diminished aeration on lung exam. No increased sputum. ABG 7.39/62/140/38. Unlikely to be pneumonia without fever and leokocytosis. Unlikely to be PE without tachycardia, tachypnea, pleuritic chest pain; wells score 1 -Admit step down. Attending Dr. Erin Hearing -S/p 125 mg IV Solu-Medrol by EMS.  - Prednisone 40 mg daily for 4 more days - Hold off antibiotics given no increased sputum production and recent course of doxy - Wean off BiPAP and transfer to floor when tolerated - Duonebs every 4 hours, with PRN albuterol nebs - Oxygen as needed - Budesonide nebs 0.5mg  twice a day    HFrEF: Sudden onset of shortness of breath with diaphoresis concerning for arrhythmia/ACS in patient with HEART score of 4.. EKG here with multiple PVC's and some ischemic changes on lateral leads although the ST elevations are less than 2 boxes. 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 Troponin 0.04 and trended to 0.05.  BNP elevated to 580 but chorincaly elevated. Weight up by 5-10 lbs. Trace edema bilaterally. However, he appears euvolumic with no crackles and pulmonary edema on CXR.Last ECHO (06/2014): EF 30-35%, possible moderate hypokinesis of the inferior  myocardium, LA and RA mildly dilated, PA peak pressure 46 mmHg. BNP elevated to 580 but chorincaly elevated. Weight up by 5-10 lbs. Trace edema bilaterally. However, he appears euvolimic with no crackles and pulmonary edema on CXR. Status post 80 mg IV lasix and good UOP -Cycle trops (poc of trop 0.04--0.05--<0.03) -Nitroglycernin as needed for chest pain -Cardiac monitoring - Daily weight. Baseline weight 159lb at last office visit on 9/13. - Strict I's & O's - Wean O2 as tolerated to keep O2 sat above 90% - EKG in the morning - Continue home med: Cozaar 100mg  qd - Consider additional lasix doses if no improvement   HTN- Stable - Continue home meds: Norvasc 10mg  qd, Cozaar 100mg  qd  GERD - Protonix 80mg  qd  BPH: - Continue home med: Flomax 0.4mg  qd  Prolonged QTc: QTc to 521 about two weeks ago. 480 now. - Caution with medications known to prolong QT.  - Follow up am EKG  History of EtOH use: last drink about three days ago.  -CIWA protocol -Folic acid and Thiamine  FEN/GI:  -NPO while on BiPAP. -1/2MIVF  Prophylaxis: Lovonex  Disposition: can be transferred to floor when he cames of BiPAP  Subjective:  Says his breathing has improved. Denies chest pain. Says he is hungry and wants to eat.   Objective: Temp:  [98 F (36.7 C)-99 F (37.2 C)] 98.7 F (37.1 C) (10/25 0836) Pulse Rate:  [25-119] 93 (10/25 0600) Resp:  [15-27] 17 (10/25 0600) BP: (111-181)/(64-125) 117/87 mmHg (10/25 0836) SpO2:  [94 %-100 %] 100 % (10/25 0851) Weight:  [163 lb 5.8 oz (74.1  kg)-175 lb (79.379 kg)] 163 lb 5.8 oz (74.1 kg) (10/25 0453) Physical Exam: Gen: sitting in bed with BiPAP in place, some distress from breathing Eyes: PERRLA, sclera anicteric Nares: clear, no erythema, swelling or congestion Oropharynx: clear, moist CV: irregularly irregular, hard to hear with BiPAP in place, 2+ radial and DP pulses bilaterally. Resp: improved work of breathing, BiPAP in place, some  wheezes bilaterally, improved aeration Abd: BS+, hyper-resonant to percussion, NDNT, no rebound or guarding.  Ext: trace, edema or gross deformities. Neuro: Alert and oriented, No gross focal deficits  Laboratory:  Recent Labs Lab 02/11/15 2120 02/12/15 0248  WBC 7.5 9.5  HGB 11.3* 11.8*  HCT 34.8* 35.8*  PLT 235 245    Recent Labs Lab 02/11/15 2120 02/12/15 0248  NA 133* 138  K 4.1 4.1  CL 93* 94*  CO2 32 33*  BUN 9 12  CREATININE 1.03 1.06  CALCIUM 9.4 9.7  PROT 7.2  --   BILITOT 0.4  --   ALKPHOS 66  --   ALT 16*  --   AST 26  --   GLUCOSE 117* 152*    Imaging/Diagnostic Tests: Dg Chest Portable 1 View  02/11/2015  CLINICAL DATA:  Shortness of breath. EXAM: PORTABLE CHEST 1 VIEW COMPARISON:  01/31/2015 FINDINGS: Heart size is upper limits of normal but may be accentuated by the portable technique. There are subtle densities in the right upper lung which are similar to the previous examination and may represent chronic changes. Upper lungs are clear. The trachea is midline. IMPRESSION: Chronic subtle densities in the right lung. Findings are probably related to chronic changes. No acute findings. Electronically Signed   By: Markus Daft M.D.   On: 02/11/2015 21:29    Mercy Riding, MD 02/12/2015, 9:29 AM PGY-1, [place] Intern pager: [phone], text pages welcome

## 2015-02-12 NOTE — Progress Notes (Signed)
Utilization review completed. Janasha Barkalow, RN, BSN. 

## 2015-02-12 NOTE — Progress Notes (Signed)
ANTIBIOTIC CONSULT NOTE - INITIAL  Pharmacy Consult for ceftriaxone Indication: pneumonia  No Known Allergies  Patient Measurements: Height: 5\' 10"  (177.8 cm) Weight: 163 lb 5.8 oz (74.1 kg) IBW/kg (Calculated) : 73 Adjusted Body Weight:   Vital Signs: Temp: 98.8 F (37.1 C) (10/25 1300) Temp Source: Oral (10/25 1300) BP: 123/73 mmHg (10/25 1234) Pulse Rate: 96 (10/25 1234) Intake/Output from previous day: 10/24 0701 - 10/25 0700 In: 375 [I.V.:375] Out: 3735 [Urine:3735] Intake/Output from this shift: Total I/O In: 1170 [P.O.:720; I.V.:450] Out: 450 [Urine:450]  Labs:  Recent Labs  02/11/15 2120 02/12/15 0248  WBC 7.5 9.5  HGB 11.3* 11.8*  PLT 235 245  CREATININE 1.03 1.06   Estimated Creatinine Clearance: 64.1 mL/min (by C-G formula based on Cr of 1.06). No results for input(s): VANCOTROUGH, VANCOPEAK, VANCORANDOM, GENTTROUGH, GENTPEAK, GENTRANDOM, TOBRATROUGH, TOBRAPEAK, TOBRARND, AMIKACINPEAK, AMIKACINTROU, AMIKACIN in the last 72 hours.   Microbiology: Recent Results (from the past 720 hour(s))  MRSA PCR Screening     Status: None   Collection Time: 02/12/15  2:03 AM  Result Value Ref Range Status   MRSA by PCR NEGATIVE NEGATIVE Final    Comment:        The GeneXpert MRSA Assay (FDA approved for NASAL specimens only), is one component of a comprehensive MRSA colonization surveillance program. It is not intended to diagnose MRSA infection nor to guide or monitor treatment for MRSA infections.     Medical History: Past Medical History  Diagnosis Date  . Hypertension   . Leukocytopenia   . Asthma   . Abdominal pain, chronic, generalized   . GERD (gastroesophageal reflux disease)   . BPH (benign prostatic hyperplasia)   . 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 Shortness of breath   . COPD (chronic obstructive pulmonary disease) ([place])   . CHF (congestive heart failure) ([facility])     Medications:  Scheduled:  . sodium chloride   Intravenous STAT   . amLODipine  10 mg Oral Daily  . antiseptic oral rinse  7 mL Mouth Rinse BID  . aspirin EC  81 mg Oral Daily  . budesonide (PULMICORT) nebulizer solution  0.5 mg Nebulization BID  . enoxaparin (LOVENOX) injection  40 mg Subcutaneous Q24H  . fluticasone  1 spray Each Nare Daily  . folic acid  1 mg Oral Daily  . gabapentin  300 mg Oral TID  . ipratropium-albuterol  3 mL Nebulization Q4H  . losartan  100 mg Oral Daily  . predniSONE  40 mg Oral Q breakfast  . tamsulosin  0.4 mg Oral Daily  . thiamine  100 mg Oral Daily  . venlafaxine XR  150 mg Oral Q breakfast   Infusions:  . sodium chloride 75 mL/hr at 02/12/15 0240   Assessment: 74 yo male with CAP will be started on ceftriaxone.    Goal of Therapy:  Resolution of infection  Plan:  - Ceftriaxone 2g iv q24h - pharmacy will sign off  Kynslie Ringle, Tsz-Yin 02/12/2015,4:14 PM

## 2015-02-12 NOTE — Progress Notes (Signed)
Took off bipap and placed on 2L.

## 2015-02-12 NOTE — Progress Notes (Signed)
Patient transported to 2C09 from ER on BiPAP with no complications.

## 2015-02-12 NOTE — Progress Notes (Signed)
Utilization review completed. Bartlett Enke, RN, BSN. 

## 2015-02-12 NOTE — Progress Notes (Signed)
Admission note:   Arrival Method: Transfer from [facility]. Mental Status: A&OX4. Telemetry: Placed on box #12. Transfer order stated med/surg, but verified with MD that the patient should remain on telemetry.  Skin: Patient has a visitor at this time and declined a total skin assessment. Only areas not assessed were back/sacrum/buttocks. Will pass on to night shift RN.  Tubes: N/A. IV: RAC PIV, LAC PIV. Pain: Denies.  Family: Friend at bedside. Living Situation: From home. Safety Measures: Bed alarm in place. Call bell within reach. Instructed to call for assistance. 6E Orientation: Oriented to unit and surroundings.  Joellen Jersey, RN.

## 2015-02-12 NOTE — Progress Notes (Signed)
Pt arrived to 2C09 from the ED at [place].  Pt is on the BIPAP sats 99%, AOx4 and very calm/cooperative, VS's all stable, afebrile, and resting comfortably.  RN will continue to monitor.

## 2015-02-13 ENCOUNTER — Encounter: Payer: Commercial Managed Care - HMO | Admitting: Cardiovascular Disease

## 2015-02-13 MED ORDER — CEFDINIR 300 MG PO CAPS: 300.0000 mg | ORAL_CAPSULE | Freq: Two times a day (BID) | ORAL | Status: DC

## 2015-02-13 MED ORDER — PREDNISONE 20 MG PO TABS: 40.0000 mg | ORAL_TABLET | Freq: Every day | ORAL | Status: DC

## 2015-02-13 MED ORDER — FLUTICASONE-SALMETEROL 250-50 MCG/DOSE IN AEPB: INHALATION_SPRAY | RESPIRATORY_TRACT | Status: AC

## 2015-02-13 NOTE — Discharge Instructions (Addendum)
It has been a pleasure taking care of you! You were admitted due to shortness of breath from exacerbation of your COPD (see below for more on this). We gave you medications to help you with your breathing. We will be discharging you on more of these medications. We may have made some adjustments to your other medications as well. Please, make sure to read the directions before you take them. The names and directions on how to take these medications are found on this discharge paper under medication section.  You also need a follow up with your primary care doctor. The address, date and time are found on the discharge paper under follow up section.  Below, you can find some reading about COPD exacerbation.  Take care,  Chronic Obstructive Pulmonary Disease Chronic obstructive pulmonary disease (COPD) is a common lung condition in which airflow from the lungs is limited. COPD is a general term that can be used to describe many different lung problems that limit airflow, including both chronic bronchitis and emphysema. If you have COPD, your lung function will probably never return to normal, but there are measures you can take to improve lung function and make yourself feel better. CAUSES   Smoking (common).  Exposure to secondhand smoke.  Genetic problems.  Chronic inflammatory lung diseases or recurrent infections. SYMPTOMS  Shortness of breath, especially with physical activity.  Deep, persistent (chronic) cough with a large amount of thick mucus.  Wheezing.  Rapid breaths (tachypnea).  Gray or bluish discoloration (cyanosis) of the skin, especially in your fingers, toes, or lips.  Fatigue.  Weight loss.  Frequent infections or episodes when breathing symptoms become much worse (exacerbations).  Chest tightness. DIAGNOSIS Your health care provider will take a medical history and perform a physical examination to diagnose COPD. Additional tests for COPD may include:  Lung  (pulmonary) function tests.  Chest X-ray.  CT scan.  Blood tests. TREATMENT  Treatment for COPD may include:  Inhaler and nebulizer medicines. These help manage the symptoms of COPD and make your breathing more comfortable.  Supplemental oxygen. Supplemental oxygen is only helpful if you have a low oxygen level in your blood.  Exercise and physical activity. These are beneficial for nearly all people with COPD.  Lung surgery or transplant.  Nutrition therapy to gain weight, if you are underweight.  Pulmonary rehabilitation. This may involve working with a team of health care providers and specialists, such as respiratory, occupational, and physical therapists. HOME CARE INSTRUCTIONS  Take all medicines (inhaled or pills) as directed by your health care provider.  Avoid over-the-counter medicines or cough syrups that dry up your airway (such as antihistamines) and slow down the elimination of secretions unless instructed otherwise by your health care provider.  If you are a smoker, the most important thing that you can do is stop smoking. Continuing to smoke will cause further lung damage and breathing trouble. Ask your health care provider for help with quitting smoking. He or she can direct you to community resources or hospitals that provide support.  Avoid exposure to irritants such as smoke, chemicals, and fumes that aggravate your breathing.  Use oxygen therapy and pulmonary rehabilitation if directed by your health care provider. If you require home oxygen therapy, ask your health care provider whether you should purchase a pulse oximeter to measure your oxygen level at home.  Avoid contact with individuals who have a contagious illness.  Avoid extreme temperature and humidity changes.  Eat healthy foods. Eating smaller,  more frequent meals and resting before meals may help you maintain your strength.  Stay active, but balance activity with periods of rest. Exercise and  physical activity will help you maintain your ability to do things you want to do.  Preventing infection and hospitalization is very important when you have COPD. Make sure to receive all the vaccines your health care provider recommends, especially the pneumococcal and influenza vaccines. Ask your health care provider whether you need a pneumonia vaccine.  Learn and use relaxation techniques to manage stress.  Learn and use controlled breathing techniques as directed by your health care provider. Controlled breathing techniques include:  Pursed lip breathing. Start by breathing in (inhaling) through your nose for 1 second. Then, purse your lips as if you were going to whistle and breathe out (exhale) through the pursed lips for 2 seconds.  Diaphragmatic breathing. Start by putting one hand on your abdomen just above your waist. Inhale slowly through your nose. The hand on your abdomen should move out. Then purse your lips and exhale slowly. You should be able to feel the hand on your abdomen moving in as you exhale.  Learn and use controlled coughing to clear mucus from your lungs. Controlled coughing is a series of short, progressive coughs. The steps of controlled coughing are: 1. Lean your head slightly forward. 2. Breathe in deeply using diaphragmatic breathing. 3. Try to hold your breath for 3 seconds. 4. Keep your mouth slightly open while coughing twice. 5. Spit any mucus out into a tissue. 6. Rest and repeat the steps once or twice as needed. SEEK MEDICAL CARE IF:  You are coughing up more mucus than usual.  There is a change in the color or thickness of your mucus.  Your breathing is more labored than usual.  Your breathing is faster than usual. SEEK IMMEDIATE MEDICAL CARE IF:  You have shortness of breath while you are resting.  You have shortness of breath that prevents you from:  Being able to talk.  Performing your usual physical activities.  You have chest pain  lasting longer than 5 minutes.  Your skin color is more cyanotic than usual.  You measure low oxygen saturations for longer than 5 minutes with a pulse oximeter. MAKE SURE YOU:  Understand these instructions.  Will watch your condition.  Will get help right away if you are not doing well or get worse.   This information is not intended to replace advice given to you by your health care provider. Make sure you discuss any questions you have with your health care provider.   Document Released: 01/14/2005 Document Revised: 04/27/2014 Document Reviewed: 12/01/2012 Elsevier Interactive Patient Education [employer].

## 2015-02-13 NOTE — Progress Notes (Signed)
cancel

## 2015-02-13 NOTE — Progress Notes (Signed)
Discharge instructions given. Pt verbalized understanding and all questions were answered.  

## 2015-02-13 NOTE — Discharge Summary (Signed)
[facility] Discharge Summary  Patient name: Steven Beasley Medical record number: [id number] Date of birth: 11-Mar-1941 Age: 74 y.o. Gender: male Date of Admission: 02/11/2015  Date of Discharge: 02/13/2015 Admitting Physician: Lind Covert, MD / Primary Care Provider: Georges Lynch, MD Consultants: none  Indication for Hospitalization:shortness of breath and increased work of breathing  Discharge Diagnoses/Problem List:  COPD HFrEF HYPERTENSION  Disposition: home  Discharge Condition: stable  Discharge Exam:  Gen: on room air with no work of breathing, able to sit up for exam quick Eyes: PERRLA, sclera anicteric Oropharynx: clear, moist CV: PVC's, no murmurs, radial and DP pulses 2+ bilaterally. Resp: no work of breathing, some wheeze bilaterally but good aeration, no crackles Abd: BS+, NDNT, no rebound or guarding.  Ext: no edema or gross deformities. Neuro: Alert and oriented, No gross focal deficits  Brief Hospital Course:  Steven Beasley is a 74 y.o. male presenting with shortness of breath and diaphoresis. PMH is significant for COPD, CHF (EF 30-35%), HYPERTENSION, EtOH use & GERD   Shortness of breath likely 2/2 to copd exacerbation vs CHF: Gold D COPD. However, he is only on albuterol and Spiriva at home. His exams were remarkable for increased work of breathing,  rhonchi and diminished aeration on lung exam on presenation. His dyspnea improved on BiPAP. PFT in 06/2014 FEV1% 38, DLCO 37%. He had no increased sputum oroduction. ABG 7.39/62/140/38. Unlikely to be pneumonia without fever and leokocytosis. Unlikely to be PE without tachycardia, tachypnea, pleuritic chest pain; wells score 1 as well. Received Solu-Medrol 125 mg IV and nebulizer treatments En route to hospital. Put on BiPAP and 6L of oxygen due to increased work of breathing on arrival to ED that didn't respond well to nebulizer treatment and admitted to step down.   Patient received Duonebs every 4 hours with PRN albuterol and scheduled budesonide nebs overnight. He was weaned to 3L oxygen by nasal canula the next days and transferred to floor. He was also started on ceftriaxone as he had completed a course of Doxy two weeks prior to admission. Levaquin and Z-pack weren't the option due to his history of  QTc prolongation although his QTc at this admission was 489. Prior to discharge, patient was ambulated on room air without desaturation. He was discharged home on Albuterol, Spiriva, Advair, Prednisone to complete 5 days burst course and Keflex for a total of 5 days.   HFrEF: appears he is only on Losartan at home. Sudden onset of shortness of breath with diaphoresis concerning for arrhythmia/ACS in patient with HEART score of 4. EKG here with multiple PVC's and some ischemic changes on lateral leads although the ST elevations are less than 2 boxes. 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 No chest pain, but troponin 0.04, 0.05, then <0.03. BNP elevated to 580 but chorincaly elevated. Weight up by 5-10 lbs. Trace edema bilaterally. However, he appears euvolumic with no crackles and pulmonary edema on CXR. Last ECHO (06/2014): EF 30-35%, possible moderate hypokinesis of the inferior myocardium, LA and RA mildly dilated, PA peak pressure 46 mmHg. BNP elevated to 580 but chorincaly elevated. Weight up by 5-10 lbs. Trace edema bilaterally. However, he appears euvolimic with no crackles and pulmonary edema on CXR. Received 80 mg IV lasix in ED. Given improvement of his dyspnea on breathing treatments, further cardiac work was not pursued.   Issues for Follow Up:  1. COPD exacerbation: patient with multiple COPD exacerbation. We introduced Advair to  his COPD meds on discharge. Consider PDE inhibitors to decrease number of exacerbation.  2. HFrEF: although this is the unlikely reason for his hospitalization this time, it requires close follow up. We rescheduled his  missed appointment with cardiologist during this hospitalizations. Unfortunately, we couldn't find an earlier spot and we had to take what is available, which is 04/29/2015 at 8:45 am.   Significant Procedures: none  Significant Labs and Imaging:   Recent Labs Lab 02/11/15 2120 02/12/15 0248  WBC 7.5 9.5  HGB 11.3* 11.8*  HCT 34.8* 35.8*  PLT 235 245    Recent Labs Lab 02/11/15 2120 02/12/15 0248  NA 133* 138  K 4.1 4.1  CL 93* 94*  CO2 32 33*  GLUCOSE 117* 152*  BUN 9 12  CREATININE 1.03 1.06  CALCIUM 9.4 9.7  ALKPHOS 66  --   AST 26  --   ALT 16*  --   ALBUMIN 4.1  --     Results/Tests Pending at Time of Discharge: none  Discharge Medications:    Medication List    TAKE these medications        albuterol (2.5 MG/3ML) 0.083% nebulizer solution  Commonly known as:  PROVENTIL  Take 3 mLs (2.5 mg total) by nebulization every 4 (four) hours as needed for wheezing or shortness of breath (dx J44.9).     albuterol 108 (90 BASE) MCG/ACT inhaler  Commonly known as:  PROVENTIL HFA;VENTOLIN HFA  Inhale 2 puffs into the lungs every 6 (six) hours as needed for wheezing or shortness of breath.     amLODipine 10 MG tablet  Commonly known as:  NORVASC  Take 1 tablet (10 mg total) by mouth daily.     aspirin 81 MG EC tablet  Take 1 tablet (81 mg total) by mouth daily.     cefdinir 300 MG capsule  Commonly known as:  OMNICEF  Take 1 capsule (300 mg total) by mouth 2 (two) times daily.     esomeprazole 40 MG capsule  Commonly known as:  NEXIUM  TAKE 1 CAPSULE BY MOUTH EVERY MORNING BEFORE A MEAL     fluticasone 50 MCG/ACT nasal spray  Commonly known as:  FLONASE  SHAKE WELL AND USE 2 SPRAYS IN EACH NOSTRIL DAILY     Fluticasone-Salmeterol 250-50 MCG/DOSE Aepb  Commonly known as:  ADVAIR DISKUS  Inhale one puff in to your lungs twice daily     gabapentin 300 MG capsule  Commonly known as:  NEURONTIN  Take 300 mg by mouth 3 (three) times daily.     losartan  100 MG tablet  Commonly known as:  COZAAR  Take 1 tablet (100 mg total) by mouth daily.     pentosan polysulfate 100 MG capsule  Commonly known as:  ELMIRON  Take 100 mg by mouth 3 (three) times daily before meals.     predniSONE 20 MG tablet  Commonly known as:  DELTASONE  Take 2 tablets (40 mg total) by mouth daily with breakfast.     tadalafil 20 MG tablet  Commonly known as:  CIALIS  Take 0.5-1 tablets (10-20 mg total) by mouth every other day as needed for erectile dysfunction.     tamsulosin 0.4 MG Caps capsule  Commonly known as:  FLOMAX  Take 0.4 mg by mouth daily.     tiotropium 18 MCG inhalation capsule  Commonly known as:  SPIRIVA HANDIHALER  Place 1 capsule (18 mcg total) into inhaler and inhale daily.  venlafaxine XR 150 MG 24 hr capsule  Commonly known as:  EFFEXOR-XR  TAKE 1 CAPSULE(150 MG) BY MOUTH DAILY        Discharge Instructions: Please refer to Patient Instructions section of EMR for full details.  Patient was counseled important signs and symptoms that should prompt return to medical care, changes in medications, dietary instructions, activity restrictions, and follow up appointments.   Follow-Up Appointments: Follow-up Information    Go to Georges Lynch, MD.   Specialty:  Family Medicine   Why:  on 02/14/2015 at 11:20 am   Contact information:   [address] [place] [address] [phone]       Go to Lauree Chandler, MD.   Specialty:  Cardiology   Why:  on 04/29/2015 at 8:45 am   Contact information:   [address]  [place] [address] [phone]      Mercy Riding, MD 02/13/2015, 6:05 PM PGY-1, [place]

## 2015-02-13 NOTE — Progress Notes (Signed)
SATURATION QUALIFICATIONS:  Patient Saturations on Room Air at Rest = 96%  Patient Saturations on Room Air while Ambulating = 96%  Patient Saturations on RA 92% post ambulation.

## 2015-02-14 ENCOUNTER — Ambulatory Visit: Payer: Commercial Managed Care - HMO | Admitting: Family Medicine

## 2015-02-22 ENCOUNTER — Other Ambulatory Visit: Payer: Self-pay

## 2015-02-22 ENCOUNTER — Inpatient Hospital Stay (HOSPITAL_COMMUNITY)
Admission: EM | Admit: 2015-02-22 | Discharge: 2015-02-25 | DRG: 190 | Disposition: A | Discharge status: Home / Self Care | Payer: Commercial Managed Care - HMO | Attending: Family Medicine | Admitting: Family Medicine

## 2015-02-22 ENCOUNTER — Encounter (HOSPITAL_COMMUNITY): Payer: Self-pay | Admitting: Emergency Medicine

## 2015-02-22 ENCOUNTER — Emergency Department (HOSPITAL_COMMUNITY): Payer: Commercial Managed Care - HMO

## 2015-02-22 DIAGNOSIS — Z7289 Other problems related to lifestyle: Secondary | ICD-10-CM | POA: Insufficient documentation

## 2015-02-22 DIAGNOSIS — I480 Paroxysmal atrial fibrillation: Secondary | ICD-10-CM

## 2015-02-22 DIAGNOSIS — K219 Gastro-esophageal reflux disease without esophagitis: Secondary | ICD-10-CM | POA: Diagnosis present

## 2015-02-22 DIAGNOSIS — I471 Supraventricular tachycardia: Secondary | ICD-10-CM | POA: Insufficient documentation

## 2015-02-22 DIAGNOSIS — J441 Chronic obstructive pulmonary disease with (acute) exacerbation: Secondary | ICD-10-CM | POA: Diagnosis not present

## 2015-02-22 DIAGNOSIS — Z7982 Long term (current) use of aspirin: Secondary | ICD-10-CM | POA: Diagnosis not present

## 2015-02-22 DIAGNOSIS — J9601 Acute respiratory failure with hypoxia: Secondary | ICD-10-CM | POA: Diagnosis present

## 2015-02-22 DIAGNOSIS — I712 Thoracic aortic aneurysm, without rupture, unspecified: Secondary | ICD-10-CM | POA: Diagnosis present

## 2015-02-22 DIAGNOSIS — Z79899 Other long term (current) drug therapy: Secondary | ICD-10-CM

## 2015-02-22 DIAGNOSIS — I4891 Unspecified atrial fibrillation: Secondary | ICD-10-CM | POA: Diagnosis not present

## 2015-02-22 DIAGNOSIS — D649 Anemia, unspecified: Secondary | ICD-10-CM | POA: Diagnosis present

## 2015-02-22 DIAGNOSIS — Z87891 Personal history of nicotine dependence: Secondary | ICD-10-CM

## 2015-02-22 DIAGNOSIS — I110 Hypertensive heart disease with heart failure: Secondary | ICD-10-CM | POA: Diagnosis present

## 2015-02-22 DIAGNOSIS — I5022 Chronic systolic (congestive) heart failure: Secondary | ICD-10-CM | POA: Diagnosis present

## 2015-02-22 DIAGNOSIS — J9602 Acute respiratory failure with hypercapnia: Secondary | ICD-10-CM

## 2015-02-22 DIAGNOSIS — N400 Enlarged prostate without lower urinary tract symptoms: Secondary | ICD-10-CM | POA: Diagnosis present

## 2015-02-22 DIAGNOSIS — R0602 Shortness of breath: Secondary | ICD-10-CM

## 2015-02-22 DIAGNOSIS — I4719 Other supraventricular tachycardia: Secondary | ICD-10-CM | POA: Insufficient documentation

## 2015-02-22 DIAGNOSIS — J189 Pneumonia, unspecified organism: Secondary | ICD-10-CM | POA: Diagnosis not present

## 2015-02-22 DIAGNOSIS — I10 Essential (primary) hypertension: Secondary | ICD-10-CM | POA: Insufficient documentation

## 2015-02-22 DIAGNOSIS — R636 Underweight: Secondary | ICD-10-CM

## 2015-02-22 DIAGNOSIS — J45909 Unspecified asthma, uncomplicated: Secondary | ICD-10-CM | POA: Diagnosis present

## 2015-02-22 DIAGNOSIS — Z789 Other specified health status: Secondary | ICD-10-CM | POA: Insufficient documentation

## 2015-02-22 HISTORY — DX: Thoracic aortic aneurysm, without rupture, unspecified: I71.20

## 2015-02-22 HISTORY — DX: Thoracic aortic aneurysm, without rupture: I71.2

## 2015-02-22 LAB — I-STAT CHEM 8, ED
BUN: 16 mg/dL (ref 6–20)
CALCIUM ION: 1.25 mmol/L (ref 1.13–1.30)
CREATININE: 1.1 mg/dL (ref 0.61–1.24)
Chloride: 94 mmol/L — ABNORMAL LOW (ref 101–111)
Glucose, Bld: 108 mg/dL — ABNORMAL HIGH (ref 65–99)
HCT: 40 % (ref 39.0–52.0)
Hemoglobin: 13.6 g/dL (ref 13.0–17.0)
Potassium: 4.5 mmol/L (ref 3.5–5.1)
Sodium: 138 mmol/L (ref 135–145)
TCO2: 35 mmol/L (ref 0–100)

## 2015-02-22 LAB — CBC WITH DIFFERENTIAL/PLATELET
Basophils Absolute: 0 10*3/uL (ref 0.0–0.1)
Basophils Relative: 0 %
Eosinophils Absolute: 0.1 10*3/uL (ref 0.0–0.7)
Eosinophils Relative: 1 %
HEMATOCRIT: 36.1 % — AB (ref 39.0–52.0)
HEMOGLOBIN: 12 g/dL — AB (ref 13.0–17.0)
LYMPHS ABS: 2.5 10*3/uL (ref 0.7–4.0)
LYMPHS PCT: 27 %
MCH: 32.7 pg (ref 26.0–34.0)
MCHC: 33.2 g/dL (ref 30.0–36.0)
MCV: 98.4 fL (ref 78.0–100.0)
MONOS PCT: 10 %
Monocytes Absolute: 0.9 10*3/uL (ref 0.1–1.0)
NEUTROS ABS: 5.7 10*3/uL (ref 1.7–7.7)
NEUTROS PCT: 62 %
Platelets: 266 10*3/uL (ref 150–400)
RBC: 3.67 MIL/uL — ABNORMAL LOW (ref 4.22–5.81)
RDW: 13.5 % (ref 11.5–15.5)
WBC: 9.2 10*3/uL (ref 4.0–10.5)

## 2015-02-22 LAB — MRSA PCR SCREENING: MRSA BY PCR: NEGATIVE

## 2015-02-22 LAB — MAGNESIUM: MAGNESIUM: 1.5 mg/dL — AB (ref 1.7–2.4)

## 2015-02-22 LAB — BASIC METABOLIC PANEL
ANION GAP: 8 (ref 5–15)
BUN: 11 mg/dL (ref 6–20)
CO2: 35 mmol/L — AB (ref 22–32)
Calcium: 10 mg/dL (ref 8.9–10.3)
Chloride: 95 mmol/L — ABNORMAL LOW (ref 101–111)
Creatinine, Ser: 1.13 mg/dL (ref 0.61–1.24)
GLUCOSE: 104 mg/dL — AB (ref 65–99)
POTASSIUM: 4.6 mmol/L (ref 3.5–5.1)
Sodium: 138 mmol/L (ref 135–145)

## 2015-02-22 LAB — I-STAT TROPONIN, ED: Troponin i, poc: 0.06 ng/mL (ref 0.00–0.08)

## 2015-02-22 LAB — TROPONIN I
TROPONIN I: 0.04 ng/mL — AB (ref <0.031)
TROPONIN I: 0.05 ng/mL — AB (ref <0.031)
Troponin I: 0.05 ng/mL — ABNORMAL HIGH (ref <0.031)

## 2015-02-22 LAB — PROTIME-INR
INR: 0.99 (ref 0.00–1.49)
PROTHROMBIN TIME: 13.3 s (ref 11.6–15.2)

## 2015-02-22 LAB — I-STAT CG4 LACTIC ACID, ED: Lactic Acid, Venous: 0.9 mmol/L (ref 0.5–2.0)

## 2015-02-22 LAB — ETHANOL

## 2015-02-22 LAB — TSH: TSH: 0.552 u[IU]/mL (ref 0.350–4.500)

## 2015-02-22 LAB — BRAIN NATRIURETIC PEPTIDE: B Natriuretic Peptide: 403.3 pg/mL — ABNORMAL HIGH (ref 0.0–100.0)

## 2015-02-22 MED ORDER — VANCOMYCIN HCL IN DEXTROSE 750-5 MG/150ML-% IV SOLN
750.0000 mg | Freq: Two times a day (BID) | INTRAVENOUS | Status: DC
  Filled 2015-02-22: qty 150

## 2015-02-22 MED ORDER — PIPERACILLIN-TAZOBACTAM 3.375 G IVPB 30 MIN: 3.3750 g | Freq: Three times a day (TID) | INTRAVENOUS | Status: DC

## 2015-02-22 MED ORDER — MAGNESIUM SULFATE 2 GM/50ML IV SOLN
2.0000 g | Freq: Once | INTRAVENOUS | Status: AC
  Administered 2015-02-22: 2 g via INTRAVENOUS
  Filled 2015-02-22: qty 50

## 2015-02-22 MED ORDER — DILTIAZEM HCL 25 MG/5ML IV SOLN: 5.0000 mg | Freq: Once | INTRAVENOUS | Status: DC

## 2015-02-22 MED ORDER — LORAZEPAM 2 MG/ML IJ SOLN: 2.0000 mg | INTRAMUSCULAR | Status: DC | PRN

## 2015-02-22 MED ORDER — IOHEXOL 350 MG/ML SOLN
80.0000 mL | Freq: Once | INTRAVENOUS | Status: AC | PRN
  Administered 2015-02-22: 80 mL via INTRAVENOUS

## 2015-02-22 MED ORDER — TAMSULOSIN HCL 0.4 MG PO CAPS
0.4000 mg | ORAL_CAPSULE | Freq: Every day | ORAL | Status: DC
  Administered 2015-02-22 – 2015-02-25 (×4): 0.4 mg via ORAL
  Filled 2015-02-22 (×4): qty 1

## 2015-02-22 MED ORDER — IPRATROPIUM-ALBUTEROL 0.5-2.5 (3) MG/3ML IN SOLN: 3.0000 mL | RESPIRATORY_TRACT | Status: DC | PRN

## 2015-02-22 MED ORDER — VANCOMYCIN HCL 10 G IV SOLR
1500.0000 mg | Freq: Once | INTRAVENOUS | Status: AC
  Administered 2015-02-22: 1500 mg via INTRAVENOUS
  Filled 2015-02-22: qty 1500

## 2015-02-22 MED ORDER — METOPROLOL SUCCINATE ER 25 MG PO TB24: 12.5000 mg | ORAL_TABLET | Freq: Every day | ORAL | Status: DC

## 2015-02-22 MED ORDER — IPRATROPIUM-ALBUTEROL 0.5-2.5 (3) MG/3ML IN SOLN
3.0000 mL | RESPIRATORY_TRACT | Status: DC
  Administered 2015-02-22 – 2015-02-23 (×3): 3 mL via RESPIRATORY_TRACT
  Filled 2015-02-22 (×3): qty 3

## 2015-02-22 MED ORDER — TIOTROPIUM BROMIDE MONOHYDRATE 18 MCG IN CAPS
18.0000 ug | ORAL_CAPSULE | Freq: Every day | RESPIRATORY_TRACT | Status: DC
  Filled 2015-02-22: qty 5

## 2015-02-22 MED ORDER — PREDNISONE 20 MG PO TABS
50.0000 mg | ORAL_TABLET | Freq: Every day | ORAL | Status: DC
  Administered 2015-02-23 – 2015-02-25 (×3): 50 mg via ORAL
  Filled 2015-02-22: qty 1
  Filled 2015-02-22 (×3): qty 2
  Filled 2015-02-22 (×3): qty 1

## 2015-02-22 MED ORDER — DILTIAZEM HCL 25 MG/5ML IV SOLN: 10.0000 mg | Freq: Once | INTRAVENOUS | Status: DC

## 2015-02-22 MED ORDER — PANTOPRAZOLE SODIUM 40 MG PO TBEC
40.0000 mg | DELAYED_RELEASE_TABLET | Freq: Every day | ORAL | Status: DC
  Administered 2015-02-22 – 2015-02-25 (×4): 40 mg via ORAL
  Filled 2015-02-22 (×4): qty 1

## 2015-02-22 MED ORDER — ASPIRIN EC 81 MG PO TBEC
81.0000 mg | DELAYED_RELEASE_TABLET | Freq: Every day | ORAL | Status: DC
  Administered 2015-02-22 – 2015-02-25 (×4): 81 mg via ORAL
  Filled 2015-02-22 (×4): qty 1

## 2015-02-22 MED ORDER — LOSARTAN POTASSIUM 50 MG PO TABS
100.0000 mg | ORAL_TABLET | Freq: Every day | ORAL | Status: DC
  Administered 2015-02-22 – 2015-02-25 (×4): 100 mg via ORAL
  Filled 2015-02-22 (×4): qty 2

## 2015-02-22 MED ORDER — AZITHROMYCIN 500 MG IV SOLR
500.0000 mg | Freq: Once | INTRAVENOUS | Status: AC
  Administered 2015-02-22: 500 mg via INTRAVENOUS
  Filled 2015-02-22: qty 500

## 2015-02-22 MED ORDER — SODIUM CHLORIDE 0.9 % IV BOLUS (SEPSIS)
1000.0000 mL | Freq: Once | INTRAVENOUS | Status: AC
Start: 2015-02-22 — End: 2015-02-22
  Administered 2015-02-22: 1000 mL via INTRAVENOUS

## 2015-02-22 MED ORDER — HEPARIN SODIUM (PORCINE) 5000 UNIT/ML IJ SOLN
5000.0000 [IU] | Freq: Three times a day (TID) | INTRAMUSCULAR | Status: DC
  Administered 2015-02-22 – 2015-02-25 (×9): 5000 [IU] via SUBCUTANEOUS
  Filled 2015-02-22 (×9): qty 1

## 2015-02-22 MED ORDER — SODIUM CHLORIDE 0.9 % IV SOLN
INTRAVENOUS | Status: DC
Start: 2015-02-22 — End: 2015-02-22
  Administered 2015-02-22: 12:00:00 via INTRAVENOUS

## 2015-02-22 MED ORDER — PIPERACILLIN-TAZOBACTAM 3.375 G IVPB 30 MIN
3.3750 g | Freq: Once | INTRAVENOUS | Status: AC
  Administered 2015-02-22: 3.375 g via INTRAVENOUS
  Filled 2015-02-22 (×2): qty 50

## 2015-02-22 MED ORDER — FOLIC ACID 1 MG PO TABS
1.0000 mg | ORAL_TABLET | Freq: Every day | ORAL | Status: DC
  Administered 2015-02-23 – 2015-02-25 (×3): 1 mg via ORAL
  Filled 2015-02-22 (×3): qty 1

## 2015-02-22 MED ORDER — VITAMIN B-1 100 MG PO TABS
100.0000 mg | ORAL_TABLET | Freq: Every day | ORAL | Status: DC
  Administered 2015-02-23 – 2015-02-25 (×3): 100 mg via ORAL
  Filled 2015-02-22 (×3): qty 1

## 2015-02-22 MED ORDER — MOMETASONE FURO-FORMOTEROL FUM 100-5 MCG/ACT IN AERO
2.0000 | INHALATION_SPRAY | Freq: Two times a day (BID) | RESPIRATORY_TRACT | Status: DC
  Filled 2015-02-22 (×2): qty 8.8

## 2015-02-22 MED ORDER — DOXYCYCLINE HYCLATE 100 MG PO TABS
100.0000 mg | ORAL_TABLET | Freq: Two times a day (BID) | ORAL | Status: DC
  Administered 2015-02-22 – 2015-02-25 (×7): 100 mg via ORAL
  Filled 2015-02-22 (×8): qty 1

## 2015-02-22 MED ORDER — DILTIAZEM HCL 25 MG/5ML IV SOLN
5.0000 mg | Freq: Once | INTRAVENOUS | Status: AC
  Administered 2015-02-22: 5 mg via INTRAVENOUS
  Filled 2015-02-22: qty 5

## 2015-02-22 NOTE — Progress Notes (Signed)
ANTIBIOTIC CONSULT NOTE - INITIAL  Pharmacy Consult for Zosyn and Vancomycin Indication: pneumonia  No Known Allergies  Patient Measurements:  Body Weight: 74 kg  Vital Signs: Temp: 98 F (36.7 C) (11/04 0331) Temp Source: Oral (11/04 0331) BP: 109/70 mmHg (11/04 0900) Pulse Rate: 101 (11/04 0900) Intake/Output from previous day:   Intake/Output from this shift: Total I/O In: 1550 [I.V.:1550] Out: -   Labs:  Recent Labs  02/22/15 0354 02/22/15 0403  WBC  --  9.2  HGB 13.6 12.0*  PLT  --  266  CREATININE 1.10 1.13  Estimated CrCL; 55 - 65 ml/min   Microbiology: Zosyn: 11/4 <<  Vancomycin 11/4 << Zithromax: 11/4 x 1   Cx data: 11/4 blood: px  Assessment: 74yoM admitted SOB cough in setting of COPD and recent admission for similar complaints. Received outpt courses of both Doxycycline and Omniceff in previous weeks.  Goal of Therapy:  Vancomycin trough level 15-20 mcg/ml  Plan:  1. continue Zosyn 3.375 gm EI Q8H 2. vancomycin LD x1; began MD of 750 mg Q12H at 18:00 on 11/4 3. VT prn 4. F/u cx data, clinical response and narrow abx as feasible (avoid FQ's with hx of QTc prolongation)   Vincenza Hews, PharmD, BCPS 02/22/2015, 10:16 AM Pager: [phone]

## 2015-02-22 NOTE — Progress Notes (Signed)
Patient is currently off bipap and on a 2L [place]. RT will continue to monitor.

## 2015-02-22 NOTE — Consult Note (Signed)
Name: Steven Beasley MRN: [id number] DOB: [birth year]    ADMISSION DATE:  02/22/2015 CONSULTATION DATE:  02/22/15  REFERRING MD :  Dr. Ardelia Mems   CHIEF COMPLAINT:  Dyspnea   BRIEF PATIENT DESCRIPTION:  74 y/o M with PMH of GOLD D COPD admitted on 11/4 with complaints of acute onset SOB.  Workup notable for A. fib with RVR.  SIGNIFICANT EVENTS  11/04  admit to [facility] with acute onset shortness of breath. AF with RVR    STUDIES:  11/04 CTA of chest >> negative for PE, persistent bronchial wall thickening and mucoid impaction, groundglass centrilobular nodules in right middle and right upper lobe, dependent atelectasis bilaterally, moderate to severe centrilobular emphysema   HISTORY OF PRESENT ILLNESS:  74 y/o M, former smoker (50 years, quit 2013), with PMH of hypertension, ETOH use (does not have withdrawal symptoms if he does not drink), GERD, BPH, smoldering myeloma, CHF (06/2014 EF 30-35%, PA peak 46), PAH, Gold D COPD (FEV1 38%, DLCO 37%) who presented to  [facility] on 11/4 with acute complaints of shortness of breath.  He was recently admitted from 10/24 - 10/26 with an acute exacerbation of COPD.  He was discharged on Advair, Spiriva and PRN albuterol (via neb and HFA).    The patient reports his apartment is being painted and he feels like the paint fumes triggered the shortness of breath.  He reports sputum production had decreased since discharge.  He reports sputum has returned to white in color.  He denies fevers/chills, n/v/d, chest pain, pain with inspiration.  At baseline, he reports he lives alone and is independent of all ADLs.  His nephew checks in on him daily.  He reports he attempted his home nebulizer without improvement.  EMS was activated and he was treated with 125 mg solumedrol, 10 mg albuterol and 1mg  atrovent.  ER evaluation notable for negative CTA of the chest for PE, essentially clear chest XR, Na 138, K 4.6, cl 95, sr cr 1.13, glucose 104, mag 1.5, BNP 403,  troponin 0.05, lactic acid 0.9, hgb 12.0, WBC 9.2 and platelets 266.  He initially required bipap via EMS but was transitioned to [place] O2 in ER.  PCCM consulted for evaluation of dyspnea.    PAST MEDICAL HISTORY :   has a past medical history of Hypertension; Leukocytopenia; Asthma; Abdominal pain, chronic, generalized; GERD (gastroesophageal reflux disease); BPH (benign prostatic hyperplasia); Smoldering myeloma ([place]) (10/19/2011); Shortness of breath; COPD (chronic obstructive pulmonary disease) ([place]); and CHF (congestive heart failure) ([place]).  has past surgical history that includes Video bronchoscopy (Bilateral, 10/28/2012); Penile prosthesis implant (1997); and left and right heart catheterization with coronary angiogram (N/A, 07/12/2014).    Prior to Admission medications   Medication Sig Start Date End Date Taking? Authorizing Kahlia Lagunes  albuterol (PROVENTIL HFA;VENTOLIN HFA) 108 (90 BASE) MCG/ACT inhaler Inhale 2 puffs into the lungs every 6 (six) hours as needed for wheezing or shortness of breath. 09/14/14  Yes Elberta Leatherwood, MD  albuterol (PROVENTIL) (2.5 MG/3ML) 0.083% nebulizer solution Take 3 mLs (2.5 mg total) by nebulization every 4 (four) hours as needed for wheezing or shortness of breath (dx J44.9). 07/19/14  Yes Rigoberto Noel, MD  amLODipine (NORVASC) 10 MG tablet Take 1 tablet (10 mg total) by mouth daily. 12/21/14  Yes Brittainy Erie Noe, PA-C  aspirin EC 81 MG EC tablet Take 1 tablet (81 mg total) by mouth daily. 07/13/14  Yes Elberta Leatherwood, MD  cefdinir (OMNICEF) 300 MG capsule  Take 1 capsule (300 mg total) by mouth 2 (two) times daily. 02/13/15  Yes Mercy Riding, MD  esomeprazole (NEXIUM) 40 MG capsule TAKE 1 CAPSULE BY MOUTH EVERY MORNING BEFORE A MEAL 12/20/14  Yes Rigoberto Noel, MD  fluticasone (FLONASE) 50 MCG/ACT nasal spray SHAKE WELL AND USE 2 SPRAYS IN [facility] NOSTRIL DAILY 12/04/14  Yes Elberta Leatherwood, MD  Fluticasone-Salmeterol (ADVAIR DISKUS) 250-50 MCG/DOSE AEPB Inhale one puff in to  your lungs twice daily 02/13/15  Yes Mercy Riding, MD  gabapentin (NEURONTIN) 300 MG capsule Take 300 mg by mouth 3 (three) times daily.     Yes Historical Shalandra Leu, MD  losartan (COZAAR) 100 MG tablet Take 1 tablet (100 mg total) by mouth daily. 12/21/14  Yes Brittainy Erie Noe, PA-C  pentosan polysulfate (ELMIRON) 100 MG capsule Take 100 mg by mouth 3 (three) times daily before meals.    Yes Historical Arhum Peeples, MD  predniSONE (DELTASONE) 20 MG tablet Take 2 tablets (40 mg total) by mouth daily with breakfast. 02/13/15  Yes Mercy Riding, MD  tadalafil (CIALIS) 20 MG tablet Take 0.5-1 tablets (10-20 mg total) by mouth every other day as needed for erectile dysfunction. 01/01/15  Yes Elberta Leatherwood, MD  Tamsulosin HCl (FLOMAX) 0.4 MG CAPS Take 0.4 mg by mouth daily.    Yes Historical Freeman Borba, MD  tiotropium (SPIRIVA HANDIHALER) 18 MCG inhalation capsule Place 1 capsule (18 mcg total) into inhaler and inhale daily. 01/17/15  Yes Leeanne Rio, MD  venlafaxine XR (EFFEXOR-XR) 150 MG 24 hr capsule TAKE 1 CAPSULE(150 MG) BY MOUTH DAILY 02/12/15  Yes Elberta Leatherwood, MD   No Known Allergies  FAMILY HISTORY:  family history includes Alcoholism in his father; Asthma in his sister; Healthy in his brother, brother, brother, sister, sister, and sister; Stroke in his mother.   SOCIAL HISTORY:  reports that he quit smoking about 5 years ago. He has never used smokeless tobacco. He reports that he drinks about 3.6 oz of alcohol per week. He reports that he does not use illicit drugs.  REVIEW OF SYSTEMS:   Constitutional: Negative for fever, chills, weight loss, malaise/fatigue and diaphoresis.  HENT: Negative for hearing loss, ear pain, nosebleeds, congestion, sore throat, neck pain, tinnitus and ear discharge.   Eyes: Negative for blurred vision, double vision, photophobia, pain, discharge and redness.  Respiratory: Negative for cough, hemoptysis, wheezing and stridor.  Reports shortness of breath, cough  with white sputum production (improved from prior admit) Cardiovascular: Negative for chest pain, palpitations, orthopnea, claudication, leg swelling and PND.  Gastrointestinal: Negative for heartburn, nausea, vomiting, abdominal pain, diarrhea, constipation, blood in stool and melena.  Genitourinary: Negative for dysuria, urgency, frequency, hematuria and flank pain.  Musculoskeletal: Negative for myalgias, back pain, joint pain and falls.  Skin: Negative for itching and rash.  Neurological: Negative for dizziness, tingling, tremors, sensory change, speech change, focal weakness, seizures, loss of consciousness, weakness and headaches.  Endo/Heme/Allergies: Negative for environmental allergies and polydipsia. Does not bruise/bleed easily.  SUBJECTIVE:  Pt reports feeling better since arrival to ER.    VITAL SIGNS: Temp:  [98 F (36.7 C)] 98 F (36.7 C) (11/04 0331) Pulse Rate:  [71-145] 98 (11/04 1130) Resp:  [14-24] 21 (11/04 1130) BP: (109-142)/(64-99) 113/90 mmHg (11/04 1130) SpO2:  [92 %-100 %] 98 % (11/04 1130) FiO2 (%):  [60 %-100 %] 60 % (11/04 0453)  PHYSICAL EXAMINATION: General:  Chronically ill appearing adult male in NAD  Neuro:  AAOx4,  speech clear, MAE  HEENT:  MM pink/moist, no jvd Cardiovascular:  s1s2 irr irr, no m/r/g Lungs: prolonged exp phase, speaking full sentences, lungs bilaterally with posterior basilar crackles, wheezing bilaterally, distant breath sounds  Abdomen:  NTND, bsx4 active  Musculoskeletal:  No acute deformities Skin:  Warm/dry, no edema    Recent Labs Lab 02/22/15 0354 02/22/15 0403  NA 138 138  K 4.5 4.6  CL 94* 95*  CO2  --  35*  BUN 16 11  CREATININE 1.10 1.13  GLUCOSE 108* 104*    Recent Labs Lab 02/22/15 0354 02/22/15 0403  HGB 13.6 12.0*  HCT 40.0 36.1*  WBC  --  9.2  PLT  --  266   Ct Angio Chest Pe W/cm &/or Wo Cm  02/22/2015  CLINICAL DATA:  Worsening shortness of breath, hypoxia and new onset atrial fibrillation.  Assess for pulmonary embolism. History of hypertension, asthma, myeloma, COPD. EXAM: CT ANGIOGRAPHY CHEST WITH CONTRAST TECHNIQUE: Multidetector CT imaging of the chest was performed using the standard protocol during bolus administration of intravenous contrast. Multiplanar CT image reconstructions and MIPs were obtained to evaluate the vascular anatomy. CONTRAST:  49mL OMNIPAQUE IOHEXOL 350 MG/ML SOLN COMPARISON:  Chest radiograph February 22, 2015 at 3:41 a.m. and CT chest July 09, 2014 FINDINGS: PULMONARY ARTERY: Adequate contrast opacification of the pulmonary artery's. Main pulmonary artery is not enlarged. No pulmonary arterial filling defects to the level of the subsegmental branches. MEDIASTINUM: The heart is moderately enlarged. Mildly dilated RIGHT ventricle. Mild calcific atherosclerosis of the aortic arch. No pericardial fluid collections. Fusiform aneurysmal thoracic aorta measuring 4.2 cm. No lymphadenopathy by CT size criteria. LUNGS: Tracheobronchial tree is patent, no pneumothorax. Bronchial wall thickening, with mild mucoid impaction. Ground-glass centrilobular nodules in the periphery of the RIGHT upper lobe, RIGHT middle lobe. Dependent atelectasis bilaterally. Moderate to severe centrilobular emphysema. Heterogeneous lung attenuation compatible with small airway disease. SOFT TISSUES AND OSSEOUS STRUCTURES: Included view of the abdomen is unremarkable. Moderate degenerative change of the dural thoracic spine an included lumbar spine. Review of the MIP images confirms the above findings. IMPRESSION: No acute pulmonary embolism. Persistent bronchial wall thickening and mucoid impaction. Increasing ground-glass centrilobular nodules within RIGHT middle and RIGHT upper lobe which may be infectious or inflammatory. Heterogeneous lung attenuation most compatible with small airway disease in a background of moderate to severe centrilobular emphysema. 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  Recommend annual imaging followup by CTA or MRA. This recommendation follows 2010 ACCF/AHA/AATS/ACR/ASA/SCA/SCAI/SIR/STS/SVM Guidelines for the Diagnosis and Management of Patients with Thoracic Aortic Disease. Circulation. 2010; 121: U981-X914 Electronically Signed   By: Elon Alas M.D.   On: 02/22/2015 05:13   Dg Chest Port 1 View  02/22/2015  CLINICAL DATA:  Shortness of breath. Right-sided wheezing. Atrial fibrillation. History of asthma, COPD, CHF, hypertension. EXAM: PORTABLE CHEST 1 VIEW COMPARISON:  02/11/2015 FINDINGS: Mild cardiac enlargement. No vascular congestion or edema. Slight fibrosis in the right lung. No focal consolidation or airspace disease in the lungs. Probable emphysema. Mediastinal contours appear intact. Calcification of the aorta. IMPRESSION: No active disease. Electronically Signed   By: Lucienne Capers M.D.   On: 02/22/2015 03:55    ASSESSMENT / PLAN:   Dyspnea - in setting of acute exacerbation of COPD, suspect pain fumes contributing factor GOLD D COPD  PAH  RUL Nodule - scarring vs inflammatory changes  HFrEF  74 y/o M with PMH of GOLD D COPD, PAH and HFrEF who presented with acute onset shortness  of breath.  Found to have AF w RVR in setting of respiratory distress.  PE ruled out with CTA.  No infiltrate on imaging.  RUL nodule concerning in a former smoker. ? scarring vs inflammatory changes.  Pt was completing prednisone taper from prior admission and was also started on Advair.  He does not have a white count, fevers or chills which goes against HCAP.     Plan: Continue prednisone 50 mg QD Continue long acting beta + ICS (continue on d/c) Spiriva QD (continue on d/c)  PRN duonebs (could consider holding long acting and giving scheduled during acute phase) Consider d/c abx as no acute infiltrate on imaging PPI while on steroids  Oxygen as needed for saturations 88-94% Assess ambulatory sats prior to d/c  SDU monitoring PRN use of BiPAP if needed  for increased work of breathing  Cautious use of IVF    Noe Gens, NP-C [facility] Pgr: [phone] or if no answer [phone] 02/22/2015, 12:37 PM

## 2015-02-22 NOTE — ED Provider Notes (Signed)
CSN: [id number]   Arrival date & time 02/22/15 6378  History  By signing my name below, I, Altamease Oiler, attest that this documentation has been prepared under the direction and in the presence of Everlene Balls, MD. Electronically Signed: Altamease Oiler, ED Scribe. 02/22/2015. 3:37 AM.  Chief Complaint  Patient presents with  . Shortness of Breath    The patient has a history of asthma, and COPD.  Tried neb treatments at home wiht no relief.  The patient is on prednisone and he says he has gotten worse instead of better.  The patient called EMS.    HPI The history is provided by the patient and the EMS personnel. The history is limited by the condition of the patient. No language interpreter was used.   Brought in by EMS on CPAP, Steven Beasley is a 74 y.o. male with history of COPD and CHF who presents to the Emergency Department complaining of worsening SOB with gradual onset over the last 24 hours. His home treatments provided no relief PTA and he is currently on Prednisone. Per EMS, oxygen saturation was in the low 80s on RA at home.  Associated symptoms include cough. Pt denies fever. 2 duonebs and 125 mg solumedrol given en route. Pt denies history of a-fib.  Past Medical History  Diagnosis Date  . Hypertension   . Leukocytopenia   . Asthma   . Abdominal pain, chronic, generalized   . GERD (gastroesophageal reflux disease)   . BPH (benign prostatic hyperplasia)   . 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 Shortness of breath   . COPD (chronic obstructive pulmonary disease) ([place])   . CHF (congestive heart failure) [facility])     Past Surgical History  Procedure Laterality Date  . Video bronchoscopy Bilateral 10/28/2012    Procedure: VIDEO BRONCHOSCOPY WITHOUT FLUORO;  Surgeon: Rigoberto Noel, MD;  Location: [facility];  Service: Cardiopulmonary;  Laterality: Bilateral;  . Penile prosthesis implant  1997  . Left and right heart catheterization with coronary angiogram N/A 07/12/2014     Procedure: LEFT AND RIGHT HEART CATHETERIZATION WITH CORONARY ANGIOGRAM;  Surgeon: Burnell Blanks, MD;  Location: [facility] CATH LAB;  Service: Cardiovascular;  Laterality: N/A;    Family History  Problem Relation Age of Onset  . Asthma Sister   . Healthy Brother   . Healthy Brother   . Healthy Brother   . Alcoholism Father   . Stroke Mother   . Healthy Sister   . Healthy Sister   . Healthy Sister     Social History  Substance Use Topics  . Smoking status: Former Smoker -- 0.00 packs/day for 0 years    Quit date: 11/08/2009  . Smokeless tobacco: Never Used  . Alcohol Use: 3.6 oz/week    6 Cans of beer per week     Comment: ocasional on weekends     Review of Systems  Constitutional: Negative for fever.  Respiratory: Positive for cough and shortness of breath.   10 Systems reviewed and all are negative for acute change except as noted in the HPI. Home Medications   Prior to Admission medications   Medication Sig Start Date End Date Taking? Authorizing Provider  albuterol (PROVENTIL HFA;VENTOLIN HFA) 108 (90 BASE) MCG/ACT inhaler Inhale 2 puffs into the lungs every 6 (six) hours as needed for wheezing or shortness of breath. 09/14/14   Elberta Leatherwood, MD  albuterol (PROVENTIL) (2.5 MG/3ML) 0.083% nebulizer solution Take 3 mLs (2.5 mg total) by nebulization every  4 (four) hours as needed for wheezing or shortness of breath (dx J44.9). 07/19/14   Rigoberto Noel, MD  amLODipine (NORVASC) 10 MG tablet Take 1 tablet (10 mg total) by mouth daily. 12/21/14   Brittainy Erie Noe, PA-C  aspirin EC 81 MG EC tablet Take 1 tablet (81 mg total) by mouth daily. 07/13/14   Elberta Leatherwood, MD  cefdinir (OMNICEF) 300 MG capsule Take 1 capsule (300 mg total) by mouth 2 (two) times daily. 02/13/15   Mercy Riding, MD  esomeprazole (NEXIUM) 40 MG capsule TAKE 1 CAPSULE BY MOUTH EVERY MORNING BEFORE A MEAL 12/20/14   Rigoberto Noel, MD  fluticasone (FLONASE) 50 MCG/ACT nasal spray SHAKE WELL AND USE 2 SPRAYS  IN [facility] NOSTRIL DAILY 12/04/14   Elberta Leatherwood, MD  Fluticasone-Salmeterol (ADVAIR DISKUS) 250-50 MCG/DOSE AEPB Inhale one puff in to your lungs twice daily 02/13/15   Mercy Riding, MD  gabapentin (NEURONTIN) 300 MG capsule Take 300 mg by mouth 3 (three) times daily.      Historical Provider, MD  losartan (COZAAR) 100 MG tablet Take 1 tablet (100 mg total) by mouth daily. 12/21/14   Brittainy Erie Noe, PA-C  pentosan polysulfate (ELMIRON) 100 MG capsule Take 100 mg by mouth 3 (three) times daily before meals.     Historical Provider, MD  predniSONE (DELTASONE) 20 MG tablet Take 2 tablets (40 mg total) by mouth daily with breakfast. 02/13/15   Mercy Riding, MD  tadalafil (CIALIS) 20 MG tablet Take 0.5-1 tablets (10-20 mg total) by mouth every other day as needed for erectile dysfunction. 01/01/15   Elberta Leatherwood, MD  Tamsulosin HCl (FLOMAX) 0.4 MG CAPS Take 0.4 mg by mouth daily.     Historical Provider, MD  tiotropium (SPIRIVA HANDIHALER) 18 MCG inhalation capsule Place 1 capsule (18 mcg total) into inhaler and inhale daily. 01/17/15   Leeanne Rio, MD  venlafaxine XR (EFFEXOR-XR) 150 MG 24 hr capsule TAKE 1 CAPSULE(150 MG) BY MOUTH DAILY 02/12/15   Elberta Leatherwood, MD    Allergies  Review of patient's allergies indicates no known allergies.  Triage Vitals: BP 123/99 mmHg  Pulse 145  Temp(Src) 98 F (36.7 C) (Oral)  Resp 19  SpO2 100%  Physical Exam  Constitutional: He is oriented to person, place, and time. Vital signs are normal. He appears well-developed and well-nourished.  Non-toxic appearance. He does not appear ill.  BIPAP mask in place  HENT:  Head: Normocephalic and atraumatic.  Nose: Nose normal.  Mouth/Throat: Oropharynx is clear and moist. No oropharyngeal exudate.  Eyes: Conjunctivae and EOM are normal. Pupils are equal, round, and reactive to light. No scleral icterus.  Neck: Normal range of motion. Neck supple. No tracheal deviation, no edema, no erythema and normal range  of motion present. No thyroid mass and no thyromegaly present.  Cardiovascular: S1 normal, S2 normal, normal heart sounds, intact distal pulses and normal pulses.  An irregularly irregular rhythm present. Tachycardia present.  Exam reveals no gallop and no friction rub.   No murmur heard. Pulses:      Radial pulses are 2+ on the right side, and 2+ on the left side.       Dorsalis pedis pulses are 2+ on the right side, and 2+ on the left side.  Pulmonary/Chest: Breath sounds normal. Accessory muscle usage present. Tachypnea noted. He is in respiratory distress. He has no wheezes. He has no rhonchi. He has no rales.  Increased work  of breathing  Abdominal: Soft. Normal appearance and bowel sounds are normal. He exhibits no distension, no ascites and no mass. There is no hepatosplenomegaly. There is no tenderness. There is no rebound, no guarding and no CVA tenderness.  Musculoskeletal: Normal range of motion. He exhibits no edema or tenderness.  Lymphadenopathy:    He has no cervical adenopathy.  Neurological: He is alert and oriented to person, place, and time. He has normal strength. No cranial nerve deficit or sensory deficit.  Skin: Skin is warm, dry and intact. No petechiae and no rash noted. He is not diaphoretic. No erythema. No pallor.  Psychiatric: He has a normal mood and affect. His behavior is normal. Judgment normal.  Nursing note and vitals reviewed.   ED Course  Procedures   DIAGNOSTIC STUDIES: Oxygen Saturation is 100% on BIPAP, adeequate by my interpretation.    COORDINATION OF CARE: 3:31 AM Discussed treatment plan which includes lab work, CXR, EKG, and Cardizem  with pt at bedside and pt agreed to plan.  Labs Reviewed  CBC WITH DIFFERENTIAL/PLATELET - Abnormal; Notable for the following:    RBC 3.67 (*)    Hemoglobin 12.0 (*)    HCT 36.1 (*)    All other components within normal limits  BASIC METABOLIC PANEL - Abnormal; Notable for the following:    Chloride 95 (*)     CO2 35 (*)    Glucose, Bld 104 (*)    All other components within normal limits  MAGNESIUM - Abnormal; Notable for the following:    Magnesium 1.5 (*)    All other components within normal limits  I-STAT CHEM 8, ED - Abnormal; Notable for the following:    Chloride 94 (*)    Glucose, Bld 108 (*)    All other components within normal limits  CULTURE, BLOOD (ROUTINE X 2)  CULTURE, BLOOD (ROUTINE X 2)  PROTIME-INR  BRAIN NATRIURETIC PEPTIDE  I-STAT CG4 LACTIC ACID, ED  I-STAT TROPOININ, ED    Imaging Review Ct Angio Chest Pe W/cm &/or Wo Cm  02/22/2015  CLINICAL DATA:  Worsening shortness of breath, hypoxia and new onset atrial fibrillation. Assess for pulmonary embolism. History of hypertension, asthma, myeloma, COPD. EXAM: CT ANGIOGRAPHY CHEST WITH CONTRAST TECHNIQUE: Multidetector CT imaging of the chest was performed using the standard protocol during bolus administration of intravenous contrast. Multiplanar CT image reconstructions and MIPs were obtained to evaluate the vascular anatomy. CONTRAST:  47mL OMNIPAQUE IOHEXOL 350 MG/ML SOLN COMPARISON:  Chest radiograph February 22, 2015 at 3:41 a.m. and CT chest July 09, 2014 FINDINGS: PULMONARY ARTERY: Adequate contrast opacification of the pulmonary artery's. Main pulmonary artery is not enlarged. No pulmonary arterial filling defects to the level of the subsegmental branches. MEDIASTINUM: The heart is moderately enlarged. Mildly dilated RIGHT ventricle. Mild calcific atherosclerosis of the aortic arch. No pericardial fluid collections. Fusiform aneurysmal thoracic aorta measuring 4.2 cm. No lymphadenopathy by CT size criteria. LUNGS: Tracheobronchial tree is patent, no pneumothorax. Bronchial wall thickening, with mild mucoid impaction. Ground-glass centrilobular nodules in the periphery of the RIGHT upper lobe, RIGHT middle lobe. Dependent atelectasis bilaterally. Moderate to severe centrilobular emphysema. Heterogeneous lung attenuation  compatible with small airway disease. SOFT TISSUES AND OSSEOUS STRUCTURES: Included view of the abdomen is unremarkable. Moderate degenerative change of the dural thoracic spine an included lumbar spine. Review of the MIP images confirms the above findings. IMPRESSION: No acute pulmonary embolism. Persistent bronchial wall thickening and mucoid impaction. Increasing ground-glass centrilobular nodules within RIGHT middle and  RIGHT upper lobe which may be infectious or inflammatory. Heterogeneous lung attenuation most compatible with small airway disease in a background of moderate to severe centrilobular emphysema. 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 Recommend annual imaging followup by CTA or MRA. This recommendation follows 2010 ACCF/AHA/AATS/ACR/ASA/SCA/SCAI/SIR/STS/SVM Guidelines for the Diagnosis and Management of Patients with Thoracic Aortic Disease. Circulation. 2010; 121: Q657-Q469 Electronically Signed   By: Elon Alas M.D.   On: 02/22/2015 05:13   Dg Chest Port 1 View  02/22/2015  CLINICAL DATA:  Shortness of breath. Right-sided wheezing. Atrial fibrillation. History of asthma, COPD, CHF, hypertension. EXAM: PORTABLE CHEST 1 VIEW COMPARISON:  02/11/2015 FINDINGS: Mild cardiac enlargement. No vascular congestion or edema. Slight fibrosis in the right lung. No focal consolidation or airspace disease in the lungs. Probable emphysema. Mediastinal contours appear intact. Calcification of the aorta. IMPRESSION: No active disease. Electronically Signed   By: Lucienne Capers M.D.   On: 02/22/2015 03:55    I personally reviewed and evaluated these images and lab results as a part of my medical decision-making.   EKG Interpretation  Date/Time:    Ventricular Rate:    PR Interval:    QRS Duration:   QT Interval:    QTC Calculation:   R Axis:     Text Interpretation:      MDM   Final diagnoses:  HCAP (healthcare-associated pneumonia)    patient presents to the emergency  department for severe shortness of breath. Initial oxygenation was 82% on room air. He was immediately placed on BiPAP and given IV fluids in the emergency department. Patient is noted to be in atrial fibrillation, heart rate as high as 140. He states this is new for him. Patient given 5 mg of IV diltiazem and heart rate has come down to the 90s to low 100s.  CTA of the chest shows possible pneumonia of the RML and RUL.  Will treat as HCAP with vanc, zosyn, and azithro.  Family Medicine teaching service paged for admission. Patient is a bounce back to their service.     CRITICAL CARE Performed by: Everlene Balls   Total critical care time: 45 minutes  Critical care time was exclusive of separately billable procedures and treating other patients.  Critical care was necessary to treat or prevent imminent or life-threatening deterioration.  Critical care was time spent personally by me on the following activities: development of treatment plan with patient and/or surrogate as well as nursing, discussions with consultants, evaluation of patient's response to treatment, examination of patient, obtaining history from patient or surrogate, ordering and performing treatments and interventions, ordering and review of laboratory studies, ordering and review of radiographic studies, pulse oximetry and re-evaluation of patient's condition.    I personally performed the services described in this documentation, which was scribed in my presence. The recorded information has been reviewed and is accurate.      Everlene Balls, MD 02/22/15 [phone]

## 2015-02-22 NOTE — ED Notes (Signed)
The patient has a history of asthma, and COPD.  Tried neb treatments at home wiht no relief.  The patient is on prednisone and he says he has gotten worse instead of better.  The patient called EMS. The patient denies any pain.  EMS gave him 125mg  of solumedrol and 10mg  of albuterol and 1mg  of Atrovent.

## 2015-02-22 NOTE — Consult Note (Signed)
CARDIOLOGY CONSULT NOTE   Patient ID: Steven Beasley MRN: [id number], DOB/AGE: Oct 02, 1940   Admit date: 02/22/2015 Date of Consult: 02/22/2015   Primary Physician: Georges Lynch, MD Primary Cardiologist: Dr. Angelena Form  Pt. Profile  74 year old AA male with HTN, COPD, chronic systolic HF with EF 34-74%, GERD, BPH and NICM presented with COPD exacerbation. Cardiology consulted for irregular rhythm  Problem List  Past Medical History  Diagnosis Date  . Hypertension   . Leukocytopenia   . Asthma   . Abdominal pain, chronic, generalized   . GERD (gastroesophageal reflux disease)   . BPH (benign prostatic hyperplasia)   . 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 Shortness of breath   . COPD (chronic obstructive pulmonary disease) ([place])   . CHF (congestive heart failure) [facility])     Past Surgical History  Procedure Laterality Date  . Video bronchoscopy Bilateral 10/28/2012    Procedure: VIDEO BRONCHOSCOPY WITHOUT FLUORO;  Surgeon: Rigoberto Noel, MD;  Location: [facility];  Service: Cardiopulmonary;  Laterality: Bilateral;  . Penile prosthesis implant  1997  . Left and right heart catheterization with coronary angiogram N/A 07/12/2014    Procedure: LEFT AND RIGHT HEART CATHETERIZATION WITH CORONARY ANGIOGRAM;  Surgeon: Burnell Blanks, MD;  Location: [facility] CATH LAB;  Service: Cardiovascular;  Laterality: N/A;     Allergies  No Known Allergies  HPI   Steven Beasley is a 74 year old AA male with HTN, COPD, chronic systolic HF with EF 25-95%, GERD, BPH and NICM. He was admitted in April 2016 for COPD exacerbation and acute systolic heart failure, echocardiogram showed EF 30-35%, he subsequently underwent left and right heart cath which showed elevated right heart pressure but nonobstructive CAD. He was placed on losartan but could not tolerate addition of beta blocker due to significant wheezing and COPD. He has not been able to tolerate Lasix as well due to hyponatremia. 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 Despite off Lasix, his volume status has been well-controlled. On the last follow-up on 12/21/2014, his weight was 161 pounds which is actually 10 pounds lower than his previous discharge weight. Since the end of September, he has been admitted multiple times for COPD exacerbation. The last time he was admitted was on 10/24 and discharged on 10/26. Per patient, he has been doing fairly well at home until yesterday when he started having increasing shortness of breath. He tried to use the nebulizer at home without significant improvement. He denies any recent orthopnea or paroxysmal nocturnal dyspnea or lower extremity edema. He eventually decided to seek medical attention at [facility] on 02/22/2015. On exam, he has significant wheezing. He was also noted to have tachycardia and was given 3 doses of IV diltiazem in the ED. Family medicine as admitted the patient and consult cardiology for possible atrial fibrillation.   Initial BNP was elevated at 403. Chest x-ray is negative for acute process. CTA of the chest was obtained which showed no acute pulmonary embolus, persistent bronchial thickening and mucoid impaction, increased ground glass centrilobular nodule within right middle and right upper lobe which may reflect if infectious or inflammatory process, heterogeneous longer attenuation most compatible with small airway disease in the background, moderate to severe centrilobular emphysema. CTA also noted ascending thoracic aortic aneurysm at 4.2 cm.  Inpatient Medications  . aspirin EC  81 mg Oral Daily  . folic acid  1 mg Oral Daily  . heparin  5,000 Units Subcutaneous 3 times per day  . losartan  100 mg Oral  Daily  . mometasone-formoterol  2 puff Inhalation BID  . pantoprazole  40 mg Oral Daily  . [START ON 02/23/2015] predniSONE  50 mg Oral Q breakfast  . tamsulosin  0.4 mg Oral Daily  . thiamine  100 mg Oral Daily  . tiotropium  18 mcg Inhalation Daily  . vancomycin   750 mg Intravenous Q12H    Family History Family History  Problem Relation Age of Onset  . Asthma Sister   . Healthy Brother   . Healthy Brother   . Healthy Brother   . Alcoholism Father   . Stroke Mother   . Healthy Sister   . Healthy Sister   . Healthy Sister      Social History Social History   Social History  . Marital Status: Single    Spouse Name: N/A  . Number of Children: N/A  . Years of Education: N/A   Occupational History  . Retired    Social History Main Topics  . Smoking status: Former Smoker -- 0.00 packs/day for 0 years    Quit date: 11/08/2009  . Smokeless tobacco: Never Used  . Alcohol Use: 3.6 oz/week    6 Cans of beer per week     Comment: ocasional on weekends  . Drug Use: No  . Sexual Activity: No   Other Topics Concern  . Not on file   Social History Narrative   5 Children     Review of Systems  General:  No chills, fever, night sweats or weight changes.  Cardiovascular:  No chest pain, edema, orthopnea, palpitations, paroxysmal nocturnal dyspnea. Dermatological: No rash, lesions/masses Respiratory: +cough, dyspnea Urologic: No hematuria, dysuria Abdominal:   No nausea, vomiting, diarrhea, bright red blood per rectum, melena, or hematemesis Neurologic:  No visual changes, wkns, changes in mental status. All other systems reviewed and are otherwise negative except as noted above.  Physical Exam  Blood pressure 113/90, pulse 98, temperature 98 F (36.7 C), temperature source Oral, resp. rate 21, SpO2 98 %.  General: Pleasant, NAD Psych: Normal affect. Neuro: Alert and oriented X 3. Moves all extremities spontaneously. HEENT: Normal  Neck: Supple without bruits or JVD. Lungs:  Resp regular and unlabored. Bilateral expiratory wheezing, markedly diminished breath sound. Bibasilar rale on inspiration. Heart: irregular. no s3, s4, or murmurs. Abdomen: Soft, non-tender, non-distended, BS + x 4.  Extremities: No clubbing, cyanosis or  edema. DP/PT/Radials 2+ and equal bilaterally.  Labs   Recent Labs  02/22/15 0953  TROPONINI 0.05*   Lab Results  Component Value Date   WBC 9.2 02/22/2015   HGB 12.0* 02/22/2015   HCT 36.1* 02/22/2015   MCV 98.4 02/22/2015   PLT 266 02/22/2015    Recent Labs Lab 02/22/15 0403  NA 138  K 4.6  CL 95*  CO2 35*  BUN 11  CREATININE 1.13  CALCIUM 10.0  GLUCOSE 104*   Lab Results  Component Value Date   CHOL 118 07/02/2014   HDL 61 07/02/2014   LDLCALC 45 07/02/2014   TRIG 58 07/02/2014   Lab Results  Component Value Date   DDIMER 0.55* 07/09/2014    Radiology/Studies  Dg Chest 2 View  01/31/2015  CLINICAL DATA:  Respiratory distress. Patient fell like something was stuck in his chest for 6 hours today. History of asthma. Previous smoker. Chest congestion for 6 months. EXAM: CHEST  2 VIEW COMPARISON:  01/12/2015 FINDINGS: Normal heart size and pulmonary vascularity. Hyperinflation of the lungs consistent with emphysematous change versus asthmatic  change. There is progressing interstitial pattern to the lungs suggesting interstitial fibrosis or less likely edema. Focal nodular infiltrates in the right mid lung similar to previous chest radiograph and corresponding to nodular infiltrates seen on previous chest CT 07/09/2014. Peribronchial thickening. Chronic interstitial process with probable mucous plugging is likely. Superimposed acute bronchitis not excluded. No blunting of costophrenic angles. No pneumothorax. Mediastinal contours appear intact. Degenerative changes in the spine. IMPRESSION: Pulmonary hyperinflation with progressing interstitial pattern to the lungs and focal nodular infiltrates in the right mid lung similar previous studies. Appearance suggests chronic interstitial process, possibly with a superimposed acute bronchitis. Electronically Signed   By: Lucienne Capers M.D.   On: 01/31/2015 21:11   Ct Angio Chest Pe W/cm &/or Wo Cm  02/22/2015  CLINICAL DATA:   Worsening shortness of breath, hypoxia and new onset atrial fibrillation. Assess for pulmonary embolism. History of hypertension, asthma, myeloma, COPD. EXAM: CT ANGIOGRAPHY CHEST WITH CONTRAST TECHNIQUE: Multidetector CT imaging of the chest was performed using the standard protocol during bolus administration of intravenous contrast. Multiplanar CT image reconstructions and MIPs were obtained to evaluate the vascular anatomy. CONTRAST:  67mL OMNIPAQUE IOHEXOL 350 MG/ML SOLN COMPARISON:  Chest radiograph February 22, 2015 at 3:41 a.m. and CT chest July 09, 2014 FINDINGS: PULMONARY ARTERY: Adequate contrast opacification of the pulmonary artery's. Main pulmonary artery is not enlarged. No pulmonary arterial filling defects to the level of the subsegmental branches. MEDIASTINUM: The heart is moderately enlarged. Mildly dilated RIGHT ventricle. Mild calcific atherosclerosis of the aortic arch. No pericardial fluid collections. Fusiform aneurysmal thoracic aorta measuring 4.2 cm. No lymphadenopathy by CT size criteria. LUNGS: Tracheobronchial tree is patent, no pneumothorax. Bronchial wall thickening, with mild mucoid impaction. Ground-glass centrilobular nodules in the periphery of the RIGHT upper lobe, RIGHT middle lobe. Dependent atelectasis bilaterally. Moderate to severe centrilobular emphysema. Heterogeneous lung attenuation compatible with small airway disease. SOFT TISSUES AND OSSEOUS STRUCTURES: Included view of the abdomen is unremarkable. Moderate degenerative change of the dural thoracic spine an included lumbar spine. Review of the MIP images confirms the above findings. IMPRESSION: No acute pulmonary embolism. Persistent bronchial wall thickening and mucoid impaction. Increasing ground-glass centrilobular nodules within RIGHT middle and RIGHT upper lobe which may be infectious or inflammatory. Heterogeneous lung attenuation most compatible with small airway disease in a background of moderate to severe  centrilobular emphysema. 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 Recommend annual imaging followup by CTA or MRA. This recommendation follows 2010 ACCF/AHA/AATS/ACR/ASA/SCA/SCAI/SIR/STS/SVM Guidelines for the Diagnosis and Management of Patients with Thoracic Aortic Disease. Circulation. 2010; 121: D326-Z124 Electronically Signed   By: Elon Alas M.D.   On: 02/22/2015 05:13   Dg Chest Port 1 View  02/22/2015  CLINICAL DATA:  Shortness of breath. Right-sided wheezing. Atrial fibrillation. History of asthma, COPD, CHF, hypertension. EXAM: PORTABLE CHEST 1 VIEW COMPARISON:  02/11/2015 FINDINGS: Mild cardiac enlargement. No vascular congestion or edema. Slight fibrosis in the right lung. No focal consolidation or airspace disease in the lungs. Probable emphysema. Mediastinal contours appear intact. Calcification of the aorta. IMPRESSION: No active disease. Electronically Signed   By: Lucienne Capers M.D.   On: 02/22/2015 03:55   Dg Chest Portable 1 View  02/11/2015  CLINICAL DATA:  Shortness of breath. EXAM: PORTABLE CHEST 1 VIEW COMPARISON:  01/31/2015 FINDINGS: Heart size is upper limits of normal but may be accentuated by the portable technique. There are subtle densities in the right upper lung which are similar to the previous examination and may represent  chronic changes. Upper lungs are clear. The trachea is midline. IMPRESSION: Chronic subtle densities in the right lung. Findings are probably related to chronic changes. No acute findings. Electronically Signed   By: Markus Daft M.D.   On: 02/11/2015 21:29    ECG  Atrial tach with frequent PACs  ASSESSMENT AND PLAN  1. Atrial tachycardia with frequent PACs in the setting of COPD exacerbation  - no obvious sign of afib on telemetry. May also has some degree of multifocal atrial tachycardia given changing p wave morphology on telemetry  - tachycardia driven by breathing issue, treat underlying cause. Given his significant wheezing  and multiple admission and ED visit for COPD exacerbation since Sept, very hesitant for him to take any BB, even selective BB such as bisoprolol. Family Medicine ordered 12.5 mg Toprol XL, I have held this for now. Although diltiazem is not the ideal choice in people for have LV dysfunction, we may consider this if tachycardia is an issue, but treating COPD will likely improve HR.   - no need for systemic anticoagulation. Agree with subcu heparin while inpatient.  2. Acute COPD exacerbation: per family medicine  3. Mild bibasilar rale concerning for acute on chronic systolic HF  - Echo 2/59/5638 EF 30-35%, possible moderate hypokinesis of inferior myocardium, mild AR, PA peak pressure 34mmHg  - No pulm edema noted on CXR, however has bibasilar rale and +JVD  - degree of rale does not correlated with his symptom, COPD exacerbation likely represent main cause for his SOB  - he has not tolerated scheduled PO lasix in the past due to hyponatremia, however has not accumulated significant amount of fluid 7 month after last acute HF episode. Likely does not need scheduled PO lasix  - Will discuss with MD, he is receiving 20ml/hr fluid at this point, potentially will need to cut back to [facility] 9ml/hr and give 1 dose of IV lasix 40mg  and expect him to reach euvolemic status after that.  4. HTN   5. Nonischemic cardiomyopathy  - L&RHC 07/12/2014 wedge pressure 21, CI 2.63, CO 5.19, clean CAD, nonischemic cardiomyopathy  6. GERD 7. BPH  8. Ascending aortic aneurysm: 4.2cm on CTA of chest, monitor as outpatient  Signed, Almyra Deforest, PA-C 02/22/2015, 12:46 PM

## 2015-02-22 NOTE — Progress Notes (Signed)
FiO2 wean per MD. Pt is on NRB at this time, pt tolerates it well. No complications noted. RN at bedside.

## 2015-02-22 NOTE — ED Notes (Signed)
Family medicine at the bedside.

## 2015-02-22 NOTE — Progress Notes (Signed)
Pt is on Bipap at this time. Tolerates it well. Pt stated prior to NIV initiation that he has HX: Asthma and COPD. On arrival labored breathing accessory muscle usage, Rhonchus/Expiratory Wheeze breath sounds noted. MD aware. RN at bedside given meds. RT will continue to monitor.

## 2015-02-22 NOTE — ED Notes (Signed)
PCCM to bedside

## 2015-02-22 NOTE — H&P (Signed)
[facility] Admission History and Physical Service Pager: [phone]  Patient name: Steven Beasley Medical record number: [id number] Date of birth: Dec 30, 1940 Age: 74 y.o. Gender: male  Primary Care Provider: Georges Lynch, MD Consultants: cardiology Code Status: FULL  Chief Complaint: SOB  Assessment and Plan: Steven Beasley is a 74 y.o. male presenting with shortness of breath and productive cough and was found to have new onset Afib. PMH is significant for COPD, CHF (EF 30-35%), HTN, EtOH use, GERD, BPH  Shortness of breath: Most likely related to age With recent admission. CT showing infectious versus inflammatory cause. Negative for pulmonary embolism. Chest x-ray showing no active disease. Improvement with BiPAP and has not transitioned to nonrebreather. Was on outpatient medications for COPD exacerbation. Denies any sick contacts. Solu-Medrol in magnesium were given in the ED. - Admit to stepdown, Dr. Ardelia Mems attending - Continue nonrebreather - Vancomycin and Zosyn per pharmacy - Sputum culture to obtain - Gram stain to obtain  AFib: Initially presented with RVR but rate controlled after initiation of guilt. Has no prior history of A. fib. Unsure of when onset of illness. Asymptomatic currently. ChadsVasc score 3 -Telemetry - Will obtain cards consult  HFrEF: Last ECHO (06/2014): EF 30-35%, possible moderate hypokinesis of the inferior myocardium, LA and RA mildly dilated, PA peak pressure 46 mmHg. BNP elevated to 480 but chorincaly elevated. Weight up by 5 lbs.   - Continue home med: Cozaar 100mg  qd - Consider additional lasix doses if no improvement - Daily weights. Baseline weight 159lb at last office visit on 9/13.   Gold D COPD: PFT in 06/2014 FEV1% 38, DLCO 37%. Rhonchi and wheezing on lung exam. No increased sputum.  -S/p 125 mg IV Solu-Medrol by EMS.  - Prednisone 50 mg daily for 4 more days starting tomorrow  - Duonebs every 4 hours,  with PRN albuterol nebs - Oxygen as needed  HTN- mildly low in ED  - Continue home meds: Cozaar 100mg  qd -  Holding home Norvasc 10mg  qd  GERD - Protonix 80mg  qd  BPH: - Continue home med: Flomax 0.4mg  qd  History of Prolonged QTc:  - Caution with medications known to prolong QT.  - Follow up am EKG  History of EtOH use: unsure of last drink. Doesn't smell of EtOH use  - EtOH pending  -CIWA protocol -Folic acid and Thiamine  Aneurysmal ascending thoracic aorta at 4.2 cm: Found incidentally on CT angiogram. Annual imaging followed by CT or MRA is recommended   FEN/GI: heart healthy  Prophylaxis: subQ   Disposition: admitted to step down for SOB   History of Present Illness:  Steven Beasley is a 74 y.o. male presenting with shortness of breath and productive cough with a history of recent hospitalization in late October for COPD exacerbation. Patient is currently on a non-rebreather with good saturations. Notes of shortness of breath that began the afternoon prior to day of admission. His symptoms worsened this morning and EMS was called. On EMS arrival, patient was noted to have oxygen saturation of 80% on room air. Patient was started on CPAP and given 2 duonebs and 125mg  Solumadrol en route to ED. Patient notes of productive cough. Patient states his house was being painting which may have caused his shortness of breath.  Patient denies sick contacts fevers, chills, nausea, vomiting, chest pain, palpitations, abdominal pain, diarrhea, orthopnea, lower extremity swelling. Does not use oxygen at baseline. Notes he has been taking his medications as prescribed.  In the ED, patient was placed on BiPAP. BMP showed bicarb of 35.  Patient was found to have new onset Afib with HR 140. Given 5mg  Diltiazem which decreased HR to 90s-100.  CXR was unremarkable. CTA chest was negative for PE, however showed right mid and right upper lobe PNA. Blood cultures were collected. Patient was given  Vancomycin, Zosyn, and Azithromycin for HCAP and coverage for atypical bacteria. He was also given 1L NS bolus, IV Mg 2g. Patient was weaned off bipap to non-rebreather in ED.   Review Of Systems: Per HPI with the following additions: See HPI  Otherwise the remainder of the systems were negative.  Patient Active Problem List   Diagnosis Date Noted  . HCAP (healthcare-associated pneumonia) 02/22/2015  . SOB (shortness of breath) 02/12/2015  . Chronic obstructive pulmonary disease ([place])   . CHF exacerbation ([employer])   . Acute on chronic systolic heart failure ([place])   . Knee pain 01/01/2015  . Erectile dysfunction 09/28/2014  . Resting tremor 09/28/2014  . Involuntary movements on examination 07/20/2014  . Hyponatremia   . Chronic systolic heart failure ([place])   . COPD exacerbation ([place]) 07/07/2014  . COPD (chronic obstructive pulmonary disease) ([place]) 12/13/2012  . 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 Leukocytopenia   . Abdominal pain, chronic, generalized   . GERD (gastroesophageal reflux disease)   . BPH (benign prostatic hyperplasia)   . UNDERWEIGHT 10/01/2009    Past Medical History: Past Medical History  Diagnosis Date  . Hypertension   . Leukocytopenia   . Asthma   . Abdominal pain, chronic, generalized   . GERD (gastroesophageal reflux disease)   . BPH (benign prostatic hyperplasia)   . 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 Shortness of breath   . COPD (chronic obstructive pulmonary disease) ([place])   . CHF (congestive heart failure) ([place])     Past Surgical History: Past Surgical History  Procedure Laterality Date  . Video bronchoscopy Bilateral 10/28/2012    Procedure: VIDEO BRONCHOSCOPY WITHOUT FLUORO;  Surgeon: Rigoberto Noel, MD;  Location: [facility];  Service: Cardiopulmonary;  Laterality: Bilateral;  . Penile prosthesis implant  1997  . Left and right heart catheterization with coronary angiogram N/A 07/12/2014    Procedure: LEFT AND RIGHT HEART CATHETERIZATION  WITH CORONARY ANGIOGRAM;  Surgeon: Burnell Blanks, MD;  Location: [facility] CATH LAB;  Service: Cardiovascular;  Laterality: N/A;    Social History: Social History  Substance Use Topics  . Smoking status: Former Smoker -- 0.00 packs/day for 0 years    Quit date: 11/08/2009  . Smokeless tobacco: Never Used  . Alcohol Use: 3.6 oz/week    6 Cans of beer per week     Comment: ocasional on weekends   Additional social history: as above   Please also refer to relevant sections of EMR.  Family History: Family History  Problem Relation Age of Onset  . Asthma Sister   . Healthy Brother   . Healthy Brother   . Healthy Brother   . Alcoholism Father   . Stroke Mother   . Healthy Sister   . Healthy Sister   . Healthy Sister     Allergies and Medications: No Known Allergies No current facility-administered medications on file prior to encounter.   Current Outpatient Prescriptions on File Prior to Encounter  Medication Sig Dispense Refill  . albuterol (PROVENTIL HFA;VENTOLIN HFA) 108 (90 BASE) MCG/ACT inhaler Inhale 2 puffs into the lungs every 6 (six) hours as needed for wheezing or shortness  of breath. 1 Inhaler 6  . albuterol (PROVENTIL) (2.5 MG/3ML) 0.083% nebulizer solution Take 3 mLs (2.5 mg total) by nebulization every 4 (four) hours as needed for wheezing or shortness of breath (dx J44.9). 360 vial 6  . amLODipine (NORVASC) 10 MG tablet Take 1 tablet (10 mg total) by mouth daily. 30 tablet 5  . aspirin EC 81 MG EC tablet Take 1 tablet (81 mg total) by mouth daily. 30 tablet 0  . cefdinir (OMNICEF) 300 MG capsule Take 1 capsule (300 mg total) by mouth 2 (two) times daily. 8 capsule 0  . esomeprazole (NEXIUM) 40 MG capsule TAKE 1 CAPSULE BY MOUTH EVERY MORNING BEFORE A MEAL 90 capsule 0  . fluticasone (FLONASE) 50 MCG/ACT nasal spray SHAKE WELL AND USE 2 SPRAYS IN EACH NOSTRIL DAILY 16 g 2  . Fluticasone-Salmeterol (ADVAIR DISKUS) 250-50 MCG/DOSE AEPB Inhale one puff in to your  lungs twice daily 1 each 2  . gabapentin (NEURONTIN) 300 MG capsule Take 300 mg by mouth 3 (three) times daily.      Marland Kitchen losartan (COZAAR) 100 MG tablet Take 1 tablet (100 mg total) by mouth daily. 30 tablet 5  . pentosan polysulfate (ELMIRON) 100 MG capsule Take 100 mg by mouth 3 (three) times daily before meals.     . predniSONE (DELTASONE) 20 MG tablet Take 2 tablets (40 mg total) by mouth daily with breakfast. 6 tablet 0  . tadalafil (CIALIS) 20 MG tablet Take 0.5-1 tablets (10-20 mg total) by mouth every other day as needed for erectile dysfunction. 5 tablet 11  . Tamsulosin HCl (FLOMAX) 0.4 MG CAPS Take 0.4 mg by mouth daily.     Marland Kitchen tiotropium (SPIRIVA HANDIHALER) 18 MCG inhalation capsule Place 1 capsule (18 mcg total) into inhaler and inhale daily. 30 capsule 2  . venlafaxine XR (EFFEXOR-XR) 150 MG 24 hr capsule TAKE 1 CAPSULE(150 MG) BY MOUTH DAILY 90 capsule 3    Objective: BP 113/77 mmHg  Pulse 90  Temp(Src) 98 F (36.7 C) (Oral)  Resp 14  SpO2 100% Exam: General: No acute distress, nonrebreather Place, cooperative with exam Eyes: Clear conjunctiva, extraocular movements intact, ENTM: No cervical lymphadenopathy Neck: Supple Cardiovascular: Irregularly irregular, normal rate, S1-S2, no murmurs rubs or gallops Respiratory: Mild bibasilar crackles, slight expiratory wheeze, no accessory muscle use, Abdomen: Soft, nontender, nondistended, positive bowel sounds, no hepatosplenomegaly MSK: Moving all freely, no edema, Skin: No rashes Neuro: No gross deficits Psych: Alert and oriented 3  Labs and Imaging: CBC BMET   Recent Labs Lab 02/22/15 0403  WBC 9.2  HGB 12.0*  HCT 36.1*  PLT 266    Recent Labs Lab 02/22/15 0403  NA 138  K 4.6  CL 95*  CO2 35*  BUN 11  CREATININE 1.13  GLUCOSE 104*  CALCIUM 10.0     CT angio IMPRESSION: No acute pulmonary embolism.  Persistent bronchial wall thickening and mucoid impaction. Increasing ground-glass centrilobular  nodules within RIGHT middle and RIGHT upper lobe which may be infectious or inflammatory.  Heterogeneous lung attenuation most compatible with small airway disease in a background of moderate to severe centrilobular emphysema.  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 Recommend annual imaging followup by CTA or MRA. This recommendation follows 2010 ACCF/AHA/AATS/ACR/ASA/SCA/SCAI/SIR/STS/SVM Guidelines for the Diagnosis and Management of Patients with Thoracic Aortic Disease. Circulation. 2010; 121: P102-H852  CXR: no active disease    Rosemarie Ax, MD PGY-3, [facility] 02/22/2015, 6:56 AM FPTS Service pager: [phone] (text pages  welcome through [facility])

## 2015-02-22 NOTE — ED Notes (Signed)
Pt with breakfast tray. 

## 2015-02-22 NOTE — ED Notes (Signed)
Admitting MD at the bedside.  

## 2015-02-23 ENCOUNTER — Encounter (HOSPITAL_COMMUNITY): Payer: Self-pay

## 2015-02-23 ENCOUNTER — Inpatient Hospital Stay (HOSPITAL_COMMUNITY): Payer: Commercial Managed Care - HMO

## 2015-02-23 DIAGNOSIS — I712 Thoracic aortic aneurysm, without rupture: Secondary | ICD-10-CM

## 2015-02-23 DIAGNOSIS — I471 Supraventricular tachycardia: Secondary | ICD-10-CM

## 2015-02-23 DIAGNOSIS — J189 Pneumonia, unspecified organism: Secondary | ICD-10-CM

## 2015-02-23 DIAGNOSIS — I4719 Other supraventricular tachycardia: Secondary | ICD-10-CM | POA: Insufficient documentation

## 2015-02-23 DIAGNOSIS — J45901 Unspecified asthma with (acute) exacerbation: Secondary | ICD-10-CM

## 2015-02-23 DIAGNOSIS — I4891 Unspecified atrial fibrillation: Secondary | ICD-10-CM

## 2015-02-23 DIAGNOSIS — I10 Essential (primary) hypertension: Secondary | ICD-10-CM

## 2015-02-23 LAB — CBC
HEMATOCRIT: 32.6 % — AB (ref 39.0–52.0)
Hemoglobin: 10.6 g/dL — ABNORMAL LOW (ref 13.0–17.0)
MCH: 31.7 pg (ref 26.0–34.0)
MCHC: 32.5 g/dL (ref 30.0–36.0)
MCV: 97.6 fL (ref 78.0–100.0)
PLATELETS: 235 10*3/uL (ref 150–400)
RBC: 3.34 MIL/uL — ABNORMAL LOW (ref 4.22–5.81)
RDW: 13.6 % (ref 11.5–15.5)
WBC: 7.4 10*3/uL (ref 4.0–10.5)

## 2015-02-23 LAB — TROPONIN I
Troponin I: 0.05 ng/mL — ABNORMAL HIGH (ref <0.031)
Troponin I: 0.05 ng/mL — ABNORMAL HIGH (ref <0.031)

## 2015-02-23 LAB — BASIC METABOLIC PANEL
Anion gap: 5 (ref 5–15)
BUN: 11 mg/dL (ref 6–20)
CHLORIDE: 98 mmol/L — AB (ref 101–111)
CO2: 33 mmol/L — ABNORMAL HIGH (ref 22–32)
CREATININE: 0.86 mg/dL (ref 0.61–1.24)
Calcium: 9 mg/dL (ref 8.9–10.3)
GFR calc Af Amer: >60 mL/min (ref >60)
GFR calc non Af Amer: >60 mL/min (ref >60)
GLUCOSE: 142 mg/dL — AB (ref 65–99)
POTASSIUM: 4.2 mmol/L (ref 3.5–5.1)
SODIUM: 136 mmol/L (ref 135–145)

## 2015-02-23 MED ORDER — MOMETASONE FURO-FORMOTEROL FUM 100-5 MCG/ACT IN AERO
2.0000 | INHALATION_SPRAY | Freq: Two times a day (BID) | RESPIRATORY_TRACT | Status: DC
  Administered 2015-02-23 – 2015-02-25 (×4): 2 via RESPIRATORY_TRACT
  Filled 2015-02-23 (×2): qty 8.8

## 2015-02-23 MED ORDER — ALBUTEROL SULFATE (2.5 MG/3ML) 0.083% IN NEBU
2.5000 mg | INHALATION_SOLUTION | RESPIRATORY_TRACT | Status: DC | PRN
Start: 2015-02-23 — End: 2015-02-25
  Administered 2015-02-23 – 2015-02-25 (×8): 2.5 mg via RESPIRATORY_TRACT
  Filled 2015-02-23 (×9): qty 3

## 2015-02-23 MED ORDER — TIOTROPIUM BROMIDE MONOHYDRATE 18 MCG IN CAPS
18.0000 ug | ORAL_CAPSULE | Freq: Every day | RESPIRATORY_TRACT | Status: DC
  Administered 2015-02-23 – 2015-02-25 (×3): 18 ug via RESPIRATORY_TRACT
  Filled 2015-02-23: qty 5

## 2015-02-23 MED ORDER — METOPROLOL SUCCINATE ER 25 MG PO TB24
25.0000 mg | ORAL_TABLET | Freq: Every day | ORAL | Status: DC
  Administered 2015-02-23 – 2015-02-25 (×3): 25 mg via ORAL
  Filled 2015-02-23 (×4): qty 1

## 2015-02-23 NOTE — Progress Notes (Signed)
Family Medicine Teaching Service Daily Progress Note Intern Pager: [phone]  Patient name: Steven Beasley Medical record number: [id number] Date of birth: [birth year] Age: 74 y.o. Gender: male  Primary Care Provider: Georges Lynch, MD Consultants: Cardiology Code Status: FULL  Pt Overview and Major Events to Date:  11/4: Admitted for acute respiratory failure with hypoxemia likely COPD exacerbation (initially thought to be HCAP; broad spectrum abx discontinued)     Assessment and Plan: Steven Beasley is a 74 y.o. male presenting with shortness of breath and productive cough and was found to have new onset Afib. PMH is significant for COPD, CHF (EF 30-35%), HTN, EtOH use, GERD, BPH, smoldering myeloma  Acute Respiratory Failure with hypoxemia likely COPD exacerbation: Ronchi and wheezing on lung exam. Likely due to COPD exacerbation: notes of increased dyspnea and cough with white sputum production with no increased sputum production. CTA chest showing infectious versus inflammatory cause; negative for pulmonary embolism. Initially thought to have HCAP however, patient is afebrile, no luekocytosis and imaging not consistent with infiltrate suggesting pneumonia. Chest x-ray showing cardiomegaly, hyperinflation, some pulmonary cephalization but no acute consolidations. Improvement with BiPAP, transitioned to non-rebreather, and now on 2L O2 via [place] with good saturations. Was on outpatient medications for COPD exacerbation after recent hospital admission in October 2016. Denies any sick contacts. Solu-Medrol, magnesium, Vanc, Zosyn, and Azithromycin were given in the ED on 11/4. Discontinued broad spectrum antibiotics as not likely HCAP. 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 Troponins: plateau at 0.05 x 3. Unlikely this is due to ACS. BNP elevated at 480 however it is chronically elevated per chart review. Per cardiology patient appears only mildly volume overloaded on exam.  - Pulmonology consulted,  appreciate recommendations: unlikely HCAP, d/c broad spectrum antibiotics; likely due to COPD exacerbation. - Prednisone 50 mg daily for 4 more days starting 11/5 - scheduled Duonebs q 4 hrs  - Doxycycline started 11/4 (will treat for 5 days) - holding Spiriva during acute phase - on Advair at home; [facility] while in hospital  - Sputum culture to obtain - Gram stain to obtain - PPI while on steroids - wean O2 as tolerated  Multifocal Tachycardia: Initially presented with what was thought to be Afib with RVR but rate controlled after initiation of guilt. Per cardiology this is multifocal tachycardia. Has no prior history of arrythmia. TSH wml. HR overall improved overnight in 80s-90s,however monitor in room showed episodes of HR 140-150s for a few seconds. Patient asymptomatic.  -Telemetry - Cards consulted, appreciate recs: more likely multifocal tachycardia over Afib, no anticoagulation needed; will resolved with treatment of underlying lung disease; He does not have HF symptoms and appears only mildly volume overloaded on exam; hesitant to start BB (may consider Diltiazem if tachycardia is an issue)  - will start Metoprolol XL 25mg    HFrEF: ECHO 06/2014 shows EF 30-35%, left ventricle mildly dilated, possible moderate hypokinesis of inferior myocardium, mild aortic valve regurgitation, calcified annulus of mitral valve with mild regurgitation, mildly dilated left and right atrium,  PA peak pressure 46 mmHg. Patient does not have BB as home med. Most recent outpatient weight 164lb on 01/17/2015, 159lb prior to that on 01/01/15. Weight on admission 163lb.  - Continue home med: Cozaar 100mg  qd - Daily weights: 163.7lb (unchanged) - urine output: 2.4L over 24 hrs - Metoprolo XL 25mg    Gold D COPD: PFT in 06/2014 FEV1% 38, DLCO 37%. Rhonchi and wheezing on lung exam. No increased sputum.  - S/p 125 mg IV Solu-Medrol  by EMS.  - Prednisone 50 mg daily for 4 more days starting 11/5  - Duonebs every  4 hours, with PRN albuterol nebs - Oxygen as needed  HTN- stable (118-131/60s-80s)  - Continue home meds: Cozaar 100mg  qd - Holding home Norvasc 10mg  qd  GERD - Protonix 80mg  qd  BPH: - Continue home med: Flomax 0.4mg  qd  History of Prolonged QTc:  - Caution with medications known to prolong QT  History of EtOH use: EtOH <2    -CIWA protocol  -Folic acid and Thiamine   Normocytic Anemia: Admission hgb 13.6, 12> 10.6 today; Baseline ~11.  - will monitor with CBC  Aneurysmal ascending thoracic aorta at 4.2 cm: Found incidentally on CT angiogram. Annual imaging followed by CT or MRA is recommended   - will noted on discharge summary    FEN/GI: heart healthy  Prophylaxis: subQ   Disposition: improvement of respiratory status  Subjective:  - doing well; shortness of breath better since admission - Duonebs help with wheezing - denies chest pain, palpitation  Objective: Temp:  [97.2 F (36.2 C)-98.1 F (36.7 C)] 97.6 F (36.4 C) (11/05 0726) Pulse Rate:  [75-115] 92 (11/05 0726) Resp:  [11-24] 16 (11/05 0726) BP: (109-147)/(59-98) 123/82 mmHg (11/05 0726) SpO2:  [92 %-100 %] 99 % (11/05 0726) Weight:  [163 lb 2.3 oz (74 kg)] 163 lb 2.3 oz (74 kg) (11/04 1507) Physical Exam: General: NAD, resting in bed  Cardiovascular: irregular rhythm; rate fluctuates (up to 130-140s for a few seconds then return to 90s), no m/r/g; distant heart sounds; no JVD Respiratory: 1L O2 via [place] with good saturations; expiratory wheezing throughout bilaterally, prolonged expiratory phase. Mild crackles on inspiration at bases bilaterally  Abdomen: soft, NT, ND Extremities: No LE edema  Laboratory:  Recent Labs Lab 02/22/15 0354 02/22/15 0403 02/23/15 0416  WBC  --  9.2 7.4  HGB 13.6 12.0* 10.6*  HCT 40.0 36.1* 32.6*  PLT  --  266 235    Recent Labs Lab 02/22/15 0354 02/22/15 0403 02/23/15 0416  NA 138 138 136  K 4.5 4.6 4.2  CL 94* 95* 98*  CO2  --  35* 33*  BUN 16 11  11   CREATININE 1.10 1.13 0.86  CALCIUM  --  10.0 9.0  GLUCOSE 108* 104* 142*   Imaging/Diagnostic Tests: CTA chest: 11/4 IMPRESSION: No acute pulmonary embolism. Persistent bronchial wall thickening and mucoid impaction. Increasing ground-glass centrilobular nodules within RIGHT middle and RIGHT upper lobe which may be infectious or inflammatory. Heterogeneous lung attenuation most compatible with small airway disease in a background of moderate to severe centrilobular emphysema. 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 Recommend annual imaging followup by CTA or MRA.  Smiley Houseman, MD 02/23/2015, 7:29 AM PGY-1, [place] Intern pager: [phone], text pages welcome

## 2015-02-23 NOTE — Consult Note (Signed)
Name: Steven Beasley MRN: [id number] DOB: Nov 09, 1940    ADMISSION DATE:  02/22/2015 CONSULTATION DATE:  02/22/15  REFERRING MD :  Dr. Ardelia Mems   CHIEF COMPLAINT:  Dyspnea   BRIEF PATIENT DESCRIPTION:  74 y/o M with PMH of GOLD D COPD admitted on 11/4 with complaints of acute onset SOB.  Workup notable for A. fib with RVR.  SIGNIFICANT EVENTS  11/04  admit to [facility] with acute onset shortness of breath. AF with RVR    STUDIES:  11/04 CTA of chest >> negative for PE, persistent bronchial wall thickening and mucoid impaction, groundglass centrilobular nodules in right middle and right upper lobe, dependent atelectasis bilaterally, moderate to severe centrilobular emphysema   SUBJECTIVE:  Feeling much better.  Asking for crackers.    VITAL SIGNS: Temp:  [97.2 F (36.2 C)-98.1 F (36.7 C)] 97.6 F (36.4 C) (11/05 0726) Pulse Rate:  [75-115] 99 (11/05 1100) Resp:  [11-21] 16 (11/05 1100) BP: (113-147)/(59-98) 118/86 mmHg (11/05 1100) SpO2:  [96 %-100 %] 96 % (11/05 1100) FiO2 (%):  [28 %] 28 % (11/05 0837) Weight:  [163 lb 2.3 oz (74 kg)] 163 lb 2.3 oz (74 kg) (11/04 1507)  PHYSICAL EXAMINATION: General:  Chronically ill appearing adult male in NAD  Neuro:  AAOx4, speech clear, MAE  HEENT:  MM pink/moist, no jvd Cardiovascular:  s1s2 irr irr, no m/r/g Lungs: resps even non labored on [place], prolonged exp phase, speaking full sentences, essentially clear, few exp wheeze Abdomen:  NTND, bsx4 active  Musculoskeletal:  No acute deformities Skin:  Warm/dry, no edema    Recent Labs Lab 02/22/15 0354 02/22/15 0403 02/23/15 0416  NA 138 138 136  K 4.5 4.6 4.2  CL 94* 95* 98*  CO2  --  35* 33*  BUN 16 11 11   CREATININE 1.10 1.13 0.86  GLUCOSE 108* 104* 142*    Recent Labs Lab 02/22/15 0354 02/22/15 0403 02/23/15 0416  HGB 13.6 12.0* 10.6*  HCT 40.0 36.1* 32.6*  WBC  --  9.2 7.4  PLT  --  266 235   Ct Angio Chest Pe W/cm &/or Wo Cm  02/22/2015  CLINICAL DATA:   Worsening shortness of breath, hypoxia and new onset atrial fibrillation. Assess for pulmonary embolism. History of hypertension, asthma, myeloma, COPD. EXAM: CT ANGIOGRAPHY CHEST WITH CONTRAST TECHNIQUE: Multidetector CT imaging of the chest was performed using the standard protocol during bolus administration of intravenous contrast. Multiplanar CT image reconstructions and MIPs were obtained to evaluate the vascular anatomy. CONTRAST:  58mL OMNIPAQUE IOHEXOL 350 MG/ML SOLN COMPARISON:  Chest radiograph February 22, 2015 at 3:41 a.m. and CT chest July 09, 2014 FINDINGS: PULMONARY ARTERY: Adequate contrast opacification of the pulmonary artery's. Main pulmonary artery is not enlarged. No pulmonary arterial filling defects to the level of the subsegmental branches. MEDIASTINUM: The heart is moderately enlarged. Mildly dilated RIGHT ventricle. Mild calcific atherosclerosis of the aortic arch. No pericardial fluid collections. Fusiform aneurysmal thoracic aorta measuring 4.2 cm. No lymphadenopathy by CT size criteria. LUNGS: Tracheobronchial tree is patent, no pneumothorax. Bronchial wall thickening, with mild mucoid impaction. Ground-glass centrilobular nodules in the periphery of the RIGHT upper lobe, RIGHT middle lobe. Dependent atelectasis bilaterally. Moderate to severe centrilobular emphysema. Heterogeneous lung attenuation compatible with small airway disease. SOFT TISSUES AND OSSEOUS STRUCTURES: Included view of the abdomen is unremarkable. Moderate degenerative change of the dural thoracic spine an included lumbar spine. Review of the MIP images confirms the above findings. IMPRESSION: No acute pulmonary  embolism. Persistent bronchial wall thickening and mucoid impaction. Increasing ground-glass centrilobular nodules within RIGHT middle and RIGHT upper lobe which may be infectious or inflammatory. Heterogeneous lung attenuation most compatible with small airway disease in a background of moderate to severe  centrilobular emphysema. 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 Recommend annual imaging followup by CTA or MRA. This recommendation follows 2010 ACCF/AHA/AATS/ACR/ASA/SCA/SCAI/SIR/STS/SVM Guidelines for the Diagnosis and Management of Patients with Thoracic Aortic Disease. Circulation. 2010; 121: W967-R916 Electronically Signed   By: Elon Alas M.D.   On: 02/22/2015 05:13   Dg Chest Port 1 View  02/23/2015  CLINICAL DATA:  74 year old male with a history of shortness of breath and COPD EXAM: PORTABLE CHEST 1 VIEW COMPARISON:  CT 02/22/2015, plain film 02/22/2015 02/11/2015, 01/21/2015 FINDINGS: Cardiomediastinal silhouette unchanged, with cardiomegaly. Stigmata of emphysema, with flattened hemidiaphragms, and hyperinflation on the AP view. Similar appearance of interstitial opacities/chronic lung scarring. No pneumothorax or confluent airspace disease. No pleural effusion. Increasing conspicuity of interlobular septa on the current from the comparison dated March 2016, though not significantly changed from the comparison. IMPRESSION: Similar appearance of interstitial opacities and interlobular septal thickening compared to the prior, though increased from March of 2016, potentially related to inflammation/ infection or mild edema. No new lobar pneumonia identified. Cardiomegaly. Emphysema Signed, Dulcy Fanny. Earleen Newport, DO [facility] Radiology Electronically Signed   By: Corrie Mckusick D.O.   On: 02/23/2015 06:58   Dg Chest Port 1 View  02/22/2015  CLINICAL DATA:  Shortness of breath. Right-sided wheezing. Atrial fibrillation. History of asthma, COPD, CHF, hypertension. EXAM: PORTABLE CHEST 1 VIEW COMPARISON:  02/11/2015 FINDINGS: Mild cardiac enlargement. No vascular congestion or edema. Slight fibrosis in the right lung. No focal consolidation or airspace disease in the lungs. Probable emphysema. Mediastinal contours appear intact. Calcification of  the aorta. IMPRESSION: No active disease. Electronically Signed   By: Lucienne Capers M.D.   On: 02/22/2015 03:55    ASSESSMENT / PLAN:  Acute respiratory failure - r/t AECOPD likely triggered by AFib with RVR. Doubt HCAP.  AECOPD  GOLD D COPD  PAH  RUL Nodule - scarring vs inflammatory changes  HFrEF AFib RVR   74 y/o M with PMH of GOLD D COPD, PAH and HFrEF who presented with acute onset shortness of breath.  Found to have AF w RVR in setting of respiratory distress.  PE ruled out with CTA.  No infiltrate on imaging.  RUL nodule concerning in a former smoker. ? scarring vs inflammatory changes.  Pt was completing prednisone taper from prior admission and was also started on Advair.  He does not have a white count, fevers or chills which goes against HCAP.     Plan:  Continue PO doxycycline  PO prednisone with slow taper Continue long acting beta + ICS (continue on d/c) Spiriva QD (continue on d/c)  PPI while on steroids  Oxygen as needed for saturations 88-94% Assess ambulatory sats prior to d/c to assess need for home O2  Ok to tx to tele from pulm standpoint  D/c bipap  HR control per cards  outpt pulm f/u   PCCM will f/u again 11/7 - please call sooner if needed    Nickolas Madrid, NP 02/23/2015  11:08 AM Pager: [phone] or [phone]

## 2015-02-23 NOTE — Progress Notes (Signed)
SUBJECTIVE: Pt says "I feel great and want to go home".      Intake/Output Summary (Last 24 hours) at 02/23/15 1119 Last data filed at 02/23/15 1100  Gross per 24 hour  Intake   1200 ml  Output   2700 ml  Net  -1500 ml    Current Facility-Administered Medications  Medication Dose Route Frequency Provider Last Rate Last Dose  . albuterol (PROVENTIL) (2.5 MG/3ML) 0.083% nebulizer solution 2.5 mg  2.5 mg Nebulization Q2H PRN Smiley Houseman, MD      . aspirin EC tablet 81 mg  81 mg Oral Daily Rosemarie Ax, MD   81 mg at 02/23/15 0935  . doxycycline (VIBRA-TABS) tablet 100 mg  100 mg Oral Q12H Elberta Leatherwood, MD   100 mg at 02/23/15 0935  . folic acid (FOLVITE) tablet 1 mg  1 mg Oral Daily Rosemarie Ax, MD   1 mg at 02/23/15 0936  . heparin injection 5,000 Units  5,000 Units Subcutaneous 3 times per day Rosemarie Ax, MD   5,000 Units at 02/23/15 [phone]  . LORazepam (ATIVAN) injection 2-3 mg  2-3 mg Intravenous Q1H PRN Rosemarie Ax, MD      . losartan (COZAAR) tablet 100 mg  100 mg Oral Daily Rosemarie Ax, MD   100 mg at 02/23/15 0935  . metoprolol succinate (TOPROL-XL) 24 hr tablet 25 mg  25 mg Oral Daily Smiley Houseman, MD      . mometasone-formoterol (DULERA) 100-5 MCG/ACT inhaler 2 puff  2 puff Inhalation BID Smiley Houseman, MD      . pantoprazole (PROTONIX) EC tablet 40 mg  40 mg Oral Daily Sela Hua, MD   40 mg at 02/23/15 0934  . predniSONE (DELTASONE) tablet 50 mg  50 mg Oral Q breakfast Rosemarie Ax, MD   50 mg at 02/23/15 0936  . tamsulosin (FLOMAX) capsule 0.4 mg  0.4 mg Oral Daily Rosemarie Ax, MD   0.4 mg at 02/23/15 0935  . thiamine (VITAMIN B-1) tablet 100 mg  100 mg Oral Daily Rosemarie Ax, MD   100 mg at 02/23/15 0935  . tiotropium (SPIRIVA) inhalation capsule 18 mcg  18 mcg Inhalation Daily Marijean Heath, NP        Filed Vitals:   02/23/15 0900 02/23/15 1000 02/23/15 1100 02/23/15 1114  BP: 136/82 135/77  118/86   Pulse: 94 96 99   Temp:    97.7 F (36.5 C)  TempSrc:    Oral  Resp: 17 16 16    Height:      Weight:      SpO2: 100% 97% 96%     PHYSICAL EXAM General: NAD HEENT: Normal. Neck: +JVD, no thyromegaly.  Lungs: Diminished throughout, no rales, prolonged expiratory phase with wheezes. CV: Tachycardic, irregular, no pedal edema. Abdomen: Soft, nontender, no distention.  Neurologic: Alert and oriented x 3.  Psych: Normal affect. Musculoskeletal: No gross deformities. Extremities: No clubbing or cyanosis.   TELEMETRY: Reviewed telemetry pt in MAT.  LABS: Basic Metabolic Panel:  Recent Labs  02/22/15 0403 02/23/15 0416  NA 138 136  K 4.6 4.2  CL 95* 98*  CO2 35* 33*  GLUCOSE 104* 142*  BUN 11 11  CREATININE 1.13 0.86  CALCIUM 10.0 9.0  MG 1.5*  --    Liver Function Tests: No results for input(s): AST, ALT, ALKPHOS, BILITOT, PROT, ALBUMIN in the last 72 hours. No  results for input(s): LIPASE, AMYLASE in the last 72 hours. CBC:  Recent Labs  02/22/15 0403 02/23/15 0416  WBC 9.2 7.4  NEUTROABS 5.7  --   HGB 12.0* 10.6*  HCT 36.1* 32.6*  MCV 98.4 97.6  PLT 266 235   Cardiac Enzymes:  Recent Labs  02/22/15 1857 02/22/15 2338 02/23/15 0416  TROPONINI 0.05* 0.05* 0.05*   BNP: Invalid input(s): POCBNP D-Dimer: No results for input(s): DDIMER in the last 72 hours. Hemoglobin A1C: No results for input(s): HGBA1C in the last 72 hours. Fasting Lipid Panel: No results for input(s): CHOL, HDL, LDLCALC, TRIG, CHOLHDL, LDLDIRECT in the last 72 hours. Thyroid Function Tests:  Recent Labs  02/22/15 1857  TSH 0.552   Anemia Panel: No results for input(s): VITAMINB12, FOLATE, FERRITIN, TIBC, IRON, RETICCTPCT in the last 72 hours.  RADIOLOGY: Dg Chest 2 View  01/31/2015  CLINICAL DATA:  Respiratory distress. Patient fell like something was stuck in his chest for 6 hours today. History of asthma. Previous smoker. Chest congestion for 6 months. EXAM:  CHEST  2 VIEW COMPARISON:  01/12/2015 FINDINGS: Normal heart size and pulmonary vascularity. Hyperinflation of the lungs consistent with emphysematous change versus asthmatic change. There is progressing interstitial pattern to the lungs suggesting interstitial fibrosis or less likely edema. Focal nodular infiltrates in the right mid lung similar to previous chest radiograph and corresponding to nodular infiltrates seen on previous chest CT 07/09/2014. Peribronchial thickening. Chronic interstitial process with probable mucous plugging is likely. Superimposed acute bronchitis not excluded. No blunting of costophrenic angles. No pneumothorax. Mediastinal contours appear intact. Degenerative changes in the spine. IMPRESSION: Pulmonary hyperinflation with progressing interstitial pattern to the lungs and focal nodular infiltrates in the right mid lung similar previous studies. Appearance suggests chronic interstitial process, possibly with a superimposed acute bronchitis. Electronically Signed   By: Lucienne Capers M.D.   On: 01/31/2015 21:11   Ct Angio Chest Pe W/cm &/or Wo Cm  02/22/2015  CLINICAL DATA:  Worsening shortness of breath, hypoxia and new onset atrial fibrillation. Assess for pulmonary embolism. History of hypertension, asthma, myeloma, COPD. EXAM: CT ANGIOGRAPHY CHEST WITH CONTRAST TECHNIQUE: Multidetector CT imaging of the chest was performed using the standard protocol during bolus administration of intravenous contrast. Multiplanar CT image reconstructions and MIPs were obtained to evaluate the vascular anatomy. CONTRAST:  26mL OMNIPAQUE IOHEXOL 350 MG/ML SOLN COMPARISON:  Chest radiograph February 22, 2015 at 3:41 a.m. and CT chest July 09, 2014 FINDINGS: PULMONARY ARTERY: Adequate contrast opacification of the pulmonary artery's. Main pulmonary artery is not enlarged. No pulmonary arterial filling defects to the level of the subsegmental branches. MEDIASTINUM: The heart is moderately enlarged.  Mildly dilated RIGHT ventricle. Mild calcific atherosclerosis of the aortic arch. No pericardial fluid collections. Fusiform aneurysmal thoracic aorta measuring 4.2 cm. No lymphadenopathy by CT size criteria. LUNGS: Tracheobronchial tree is patent, no pneumothorax. Bronchial wall thickening, with mild mucoid impaction. Ground-glass centrilobular nodules in the periphery of the RIGHT upper lobe, RIGHT middle lobe. Dependent atelectasis bilaterally. Moderate to severe centrilobular emphysema. Heterogeneous lung attenuation compatible with small airway disease. SOFT TISSUES AND OSSEOUS STRUCTURES: Included view of the abdomen is unremarkable. Moderate degenerative change of the dural thoracic spine an included lumbar spine. Review of the MIP images confirms the above findings. IMPRESSION: No acute pulmonary embolism. Persistent bronchial wall thickening and mucoid impaction. Increasing ground-glass centrilobular nodules within RIGHT middle and RIGHT upper lobe which may be infectious or inflammatory. Heterogeneous lung attenuation most compatible with small airway disease  in a background of moderate to severe centrilobular emphysema. 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 Recommend annual imaging followup by CTA or MRA. This recommendation follows 2010 ACCF/AHA/AATS/ACR/ASA/SCA/SCAI/SIR/STS/SVM Guidelines for the Diagnosis and Management of Patients with Thoracic Aortic Disease. Circulation. 2010; 121: Q469-G295 Electronically Signed   By: Elon Alas M.D.   On: 02/22/2015 05:13   Dg Chest Port 1 View  02/23/2015  CLINICAL DATA:  74 year old male with a history of shortness of breath and COPD EXAM: PORTABLE CHEST 1 VIEW COMPARISON:  CT 02/22/2015, plain film 02/22/2015 02/11/2015, 01/21/2015 FINDINGS: Cardiomediastinal silhouette unchanged, with cardiomegaly. Stigmata of emphysema, with flattened hemidiaphragms, and hyperinflation on the AP view. Similar appearance of interstitial opacities/chronic  lung scarring. No pneumothorax or confluent airspace disease. No pleural effusion. Increasing conspicuity of interlobular septa on the current from the comparison dated March 2016, though not significantly changed from the comparison. IMPRESSION: Similar appearance of interstitial opacities and interlobular septal thickening compared to the prior, though increased from March of 2016, potentially related to inflammation/ infection or mild edema. No new lobar pneumonia identified. Cardiomegaly. Emphysema Signed, Dulcy Fanny. Earleen Newport, DO [facility] Radiology Electronically Signed   By: Corrie Mckusick D.O.   On: 02/23/2015 06:58   Dg Chest Port 1 View  02/22/2015  CLINICAL DATA:  Shortness of breath. Right-sided wheezing. Atrial fibrillation. History of asthma, COPD, CHF, hypertension. EXAM: PORTABLE CHEST 1 VIEW COMPARISON:  02/11/2015 FINDINGS: Mild cardiac enlargement. No vascular congestion or edema. Slight fibrosis in the right lung. No focal consolidation or airspace disease in the lungs. Probable emphysema. Mediastinal contours appear intact. Calcification of the aorta. IMPRESSION: No active disease. Electronically Signed   By: Lucienne Capers M.D.   On: 02/22/2015 03:55   Dg Chest Portable 1 View  02/11/2015  CLINICAL DATA:  Shortness of breath. EXAM: PORTABLE CHEST 1 VIEW COMPARISON:  01/31/2015 FINDINGS: Heart size is upper limits of normal but may be accentuated by the portable technique. There are subtle densities in the right upper lung which are similar to the previous examination and may represent chronic changes. Upper lungs are clear. The trachea is midline. IMPRESSION: Chronic subtle densities in the right lung. Findings are probably related to chronic changes. No acute findings. Electronically Signed   By: Markus Daft M.D.   On: 02/11/2015 21:29      ASSESSMENT AND PLAN: 1. Multifocal atrial tachycardia: Rhythm will improve with treatment of his  underlying lung disease. Would not start beta blockers nor calcium channel blockers given COPD exacerbation and LV dysfunction, respectively.  2. COPD exacerbation: Per medicine.  3. Chronic systolic HF/nonischemic CMP: Not volume overloaded. Echo 07/09/2014 EF 30-35%, possible moderate hypokinesis of inferior myocardium, mild AR, PA peak pressure 42mmHg. Does not need diuretics.  4. Essential HTN: Controlled. No changes.  5. Ascending aortic aneurysm: 4.2 cm on CTA of chest, monitor as outpatient.   Kate Sable, M.D., F.A.C.C.

## 2015-02-23 NOTE — Progress Notes (Signed)
Patent transferred to new room 2W08 with belongings and chart via wheelchair. Patient denies any questions or complaints at this time. Report given to receiving nurse,[place],RN  Loni Muse, RN

## 2015-02-23 NOTE — Progress Notes (Signed)
PT Cancellation Note  Patient Details Name: Steven Beasley MRN: [id number] DOB: 1940/05/11   Cancelled Treatment:    Reason Eval/Treat Not Completed: Medical issues which prohibited therapy;Patient not medically ready. Patient with irregular HR; spoke with nurse, HR reaching up to 166. Will hold on evaluation till medically stable.    [place], [place] N PT  02/23/2015, 11:08 AM

## 2015-02-24 MED ORDER — BENZONATATE 100 MG PO CAPS
100.0000 mg | ORAL_CAPSULE | Freq: Three times a day (TID) | ORAL | Status: DC | PRN
  Administered 2015-02-24 (×2): 100 mg via ORAL
  Filled 2015-02-24 (×2): qty 1

## 2015-02-24 NOTE — Progress Notes (Signed)
Family Medicine Teaching Service Daily Progress Note Intern Pager: [phone]  Patient name: Steven Beasley Medical record number: [id number] Date of birth: 1940-09-29 Age: 74 y.o. Gender: male  Primary Care Provider: Georges Lynch, MD Consultants: Cardiology Code Status: FULL  Pt Overview and Major Events to Date:  11/4: Admitted for acute respiratory failure with hypoxemia likely COPD exacerbation (initially thought to be HCAP; broad spectrum abx discontinued) initially requiring bipap now on 1L [place]  Assessment and Plan: Steven Beasley is a 74 y.o. male presenting with shortness of breath and productive cough and was found to have new onset Afib. PMH is significant for COPD, CHF (EF 30-35%), HTN, EtOH use, GERD, BPH, smoldering myeloma  Acute Respiratory Failure with hypoxemia likely COPD exacerbation: Ronchi and wheezing on lung exam. Likely due to COPD exacerbation: notes of increased dyspnea and cough with white sputum production with no increased sputum production. CTA chest showing infectious versus inflammatory cause; negative for pulmonary embolism. Initially thought to have HCAP however, patient is afebrile, no luekocytosis and imaging not consistent with infiltrate suggesting pneumonia. Chest x-ray showing cardiomegaly, hyperinflation, some pulmonary cephalization but no acute consolidations. Improvement with BiPAP, transitioned to non-rebreather, and now on 2L O2 via [place] with good saturations. Was on outpatient medications for COPD exacerbation after recent hospital admission in October 2016. Denies any sick contacts. Solu-Medrol, magnesium, Vanc, Zosyn, and Azithromycin were given in the ED on 11/4. Discontinued broad spectrum antibiotics as not likely HCAP. 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 Troponins: plateau at 0.05 x 3. Unlikely this is due to ACS. BNP elevated at 480 however it is chronically elevated per chart review. Per cardiology patient appears only mildly volume overloaded  on exam.  - Pulmonology consulted, appreciate recommendations: unlikely HCAP, d/c broad spectrum antibiotics; likely due to COPD exacerbation. - Prednisone 50 mg daily starting 11/5 - scheduled Duonebs q 4 hrs  - Doxycycline started 11/4 (will treat for 5 days) - holding Spiriva during acute phase - on Advair at home; [facility] while in hospital  - d/c duonebs by pulm; restarted on Spiriva - PPI while on steroids - wean O2 as tolerated - will need to ambulate while monitoring O2 saturations before discharge to assess need for home O2  Multifocal Tachycardia: Initially presented with what was thought to be Afib with RVR but rate controlled after initiation of guilt. Per cardiology this is multifocal tachycardia. Has no prior history of arrythmia. TSH wml. HR overall improved. Patient asymptomatic.  -Telemetry - Cards consulted, appreciate recs: more likely multifocal tachycardia over Afib, no anticoagulation needed; will resolved with treatment of underlying lung disease - Metoprolol XL 25mg    HFrEF: ECHO 06/2014 shows EF 30-35%, left ventricle mildly dilated, possible moderate hypokinesis of inferior myocardium, mild aortic valve regurgitation, calcified annulus of mitral valve with mild regurgitation, mildly dilated left and right atrium, PA peak pressure 46 mmHg. Patient does not have BB as home med. Most recent outpatient weight 164lb on 01/17/2015, 159lb prior to that on 01/01/15. Weight on admission 163lb.  - Continue home med: Cozaar 100mg  qd - Daily weights: 163.7lb (unchanged) - Metoprolol XL 25mg    Gold D COPD: PFT in 06/2014 FEV1% 38, DLCO 37%. Rhonchi and wheezing on lung exam. - S/p 125 mg IV Solu-Medrol by EMS.  - Prednisone 50 mg daily started 11/5  - d/c duonebs per pulm; restarted home Spiriva  - Dulera - PRN albuterol nebs - Oxygen as needed  HTN- stable - Continue home meds: Cozaar 100mg  qd -  Holding home Norvasc 10mg  qd  GERD - Protonix 80mg  qd  BPH: -  Continue home med: Flomax 0.4mg  qd  History of Prolonged QTc:  - Caution with medications known to prolong QT  History of EtOH use: EtOH <2   -CIWA protocol  -Folic acid and Thiamine   Normocytic Anemia: Admission hgb 13.6, 12> 10.6 today; Baseline ~11.  - will monitor with CBC  Aneurysmal ascending thoracic aorta at 4.2 cm: Found incidentally on CT angiogram. Annual imaging followed by CT or MRA is recommended  - will noted on discharge summary   FEN/GI: heart healthy  Prophylaxis: subQ heparin  Disposition: improvement of respiratory status, likely tomorrow 11/7  Subjective:  - doing well today; no shortness of breath but has some wheezing   Objective: Temp:  [97.6 F (36.4 C)-98.2 F (36.8 C)] 97.6 F (36.4 C) (11/06 0520) Pulse Rate:  [79-102] 79 (11/06 0841) Resp:  [15-24] 18 (11/06 0841) BP: (98-140)/(63-87) 98/74 mmHg (11/06 0520) SpO2:  [90 %-100 %] 94 % (11/06 0841) FiO2 (%):  [24 %] 24 % (11/05 1125) Physical Exam: General: NAD, resting in bed  Cardiovascular: irregular rhythm; rate fluctuates (up to 130-140s for a few seconds then return to 90s), no m/r/g; distant heart sounds; no JVD Respiratory: 1L O2 via [place] with good saturations; expiratory wheezing throughout bilaterally, prolonged expiratory phase. No crackles  Abdomen: soft, NT, ND Extremities: No LE edema  Laboratory:  Recent Labs Lab 02/22/15 0354 02/22/15 0403 02/23/15 0416  WBC  --  9.2 7.4  HGB 13.6 12.0* 10.6*  HCT 40.0 36.1* 32.6*  PLT  --  266 235    Recent Labs Lab 02/22/15 0354 02/22/15 0403 02/23/15 0416  NA 138 138 136  K 4.5 4.6 4.2  CL 94* 95* 98*  CO2  --  35* 33*  BUN 16 11 11   CREATININE 1.10 1.13 0.86  CALCIUM  --  10.0 9.0  GLUCOSE 108* 104* 142*   Imaging/Diagnostic Tests: CTA chest: 11/4 IMPRESSION: No acute pulmonary embolism. Persistent bronchial wall thickening and mucoid impaction. Increasing ground-glass centrilobular nodules within RIGHT middle  and RIGHT upper lobe which may be infectious or inflammatory. Heterogeneous lung attenuation most compatible with small airway disease in a background of moderate to severe centrilobular emphysema. 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 Recommend annual imaging followup by CTA or MRA.  Smiley Houseman, MD 02/24/2015, 9:00 AM PGY-1, [place] Intern pager: [phone], text pages welcome

## 2015-02-24 NOTE — Progress Notes (Signed)
Patient having runs of tach between 140-160 and then back down below 100. Checked on patient several times and he was asymptomatic.  Happens more frequently when he coughs.  Paged the physician on call and spoke with Dr Eula Fried.  He ordered tessalon for his cough. Unable to order any medicine for heart rate due to prior concerns. Medication given. He also has his oxygen back on. Will continue to monitor. Call light within reach.

## 2015-02-24 NOTE — Progress Notes (Signed)
Patient resting comfortably, little to no cough, heart rate has decreased, fewer tachy episodes. Call light within reach.

## 2015-02-24 NOTE — Care Management Important Message (Signed)
Important Message  Patient Details  Name: Steven Beasley MRN: [id number] Date of Birth: 1940/06/18   Medicare Important Message Given:  Yes-second notification given    Nathen May 02/24/2015, 10:07 AM

## 2015-02-24 NOTE — Evaluation (Signed)
Physical Therapy Evaluation Patient Details Name: Steven Beasley MRN: [id number] DOB: 19-Dec-1940 Today's Date: 02/24/2015   History of Present Illness   74 y.o. male admitted 11/4 for acute respiratory failure with hypoxemia likely COPD exacerbation (initially thought to be HCAP; broad spectrum abx discontinued) initially requiring bipap now on 1L [place]   PMH is significant for COPD, CHF (EF 30-35%), HTN, EtOH use, GERD, BPH, smoldering myeloma   Clinical Impression  Pt presents at/near baseline independent with all mobility ADLs necessary for safe d/c home.  PaO2 assessed during ambulation on RA to be >94% with mild incr WOB noted but pt tolerated with need for assistance.  Educated on breathing strategies and energy conservation, recommend medical staff reinforce education with patient.  No PT needs at this time.  See below for details of exam findings.     Follow Up Recommendations No PT follow up    Equipment Recommendations  None recommended by PT    Recommendations for Other Services       Precautions / Restrictions Precautions Precautions: None Restrictions Weight Bearing Restrictions: No      Mobility  Bed Mobility Overal bed mobility: Independent                Transfers Overall transfer level: Independent                  Ambulation/Gait Ambulation/Gait assistance: Supervision Ambulation Distance (Feet): 250 Feet Assistive device: None Gait Pattern/deviations: Step-through pattern     General Gait Details: supervision only to cue breathing strategies as monitored PaO2 on RA -- maintained >94% throughout, with mild incr WOB noted, cough nonproductive asking for breathing tx, communciated all to RN  Stairs            Wheelchair Mobility    Modified Rankin (Stroke Patients Only)       Balance Overall balance assessment: No apparent balance deficits (not formally assessed)                                            Pertinent Vitals/Pain Pain Assessment: No/denies pain    Home Living Family/patient expects to be discharged to:: Private residence Living Arrangements: Alone Available Help at Discharge: Family;Available PRN/intermittently Type of Home: Apartment Home Access: Level entry     Home Layout: One level Home Equipment: None      Prior Function Level of Independence: Independent         Comments: does not drive, nephew assist with transportation     Hand Dominance   Dominant Hand: Right    Extremity/Trunk Assessment   Upper Extremity Assessment: Overall WFL for tasks assessed           Lower Extremity Assessment: Overall WFL for tasks assessed      Cervical / Trunk Assessment: Normal  Communication   Communication: No difficulties  Cognition Arousal/Alertness: Awake/alert Behavior During Therapy: WFL for tasks assessed/performed Overall Cognitive Status: Within Functional Limits for tasks assessed                      General Comments      Exercises        Assessment/Plan    PT Assessment Patent does not need any further PT services  PT Diagnosis     PT Problem List    PT Treatment Interventions     PT Goals (  Current goals can be found in the Care Plan section) Acute Rehab PT Goals Patient Stated Goal: go home PT Goal Formulation: All assessment and education complete, DC therapy    Frequency     Barriers to discharge        Co-evaluation               End of Session Equipment Utilized During Treatment: Gait belt;Oxygen Activity Tolerance: Patient tolerated treatment well Patient left: in bed;with call bell/phone within reach Nurse Communication: Mobility status         Time: 4097-3532 PT Time Calculation (min) (ACUTE ONLY): 23 min   Charges:   PT Evaluation $Initial PT Evaluation Tier I: 1 Procedure     PT G Codes:        Steven Beasley 02/24/2015, 3:43 PM

## 2015-02-25 ENCOUNTER — Other Ambulatory Visit: Payer: Self-pay

## 2015-02-25 ENCOUNTER — Other Ambulatory Visit: Payer: Self-pay | Admitting: Internal Medicine

## 2015-02-25 DIAGNOSIS — I10 Essential (primary) hypertension: Secondary | ICD-10-CM | POA: Insufficient documentation

## 2015-02-25 MED ORDER — PREDNISONE 50 MG PO TABS
50.0000 mg | ORAL_TABLET | Freq: Every day | ORAL | Status: DC
Start: 2015-02-25 — End: 2015-02-25

## 2015-02-25 MED ORDER — ALBUTEROL SULFATE HFA 108 (90 BASE) MCG/ACT IN AERS: 2.0000 | INHALATION_SPRAY | Freq: Four times a day (QID) | RESPIRATORY_TRACT | Status: AC | PRN

## 2015-02-25 MED ORDER — PREDNISONE 50 MG PO TABS: 50.0000 mg | ORAL_TABLET | Freq: Every day | ORAL | Status: AC

## 2015-02-25 MED ORDER — DOXYCYCLINE HYCLATE 100 MG PO TABS: 100.0000 mg | ORAL_TABLET | Freq: Two times a day (BID) | ORAL | Status: AC

## 2015-02-25 MED ORDER — METOPROLOL SUCCINATE ER 25 MG PO TB24: 25.0000 mg | ORAL_TABLET | Freq: Every day | ORAL | Status: DC

## 2015-02-25 MED ORDER — PREDNISONE 50 MG PO TABS: 50.0000 mg | ORAL_TABLET | Freq: Every day | ORAL | Status: DC

## 2015-02-25 NOTE — Progress Notes (Signed)
Family Medicine Teaching Service Daily Progress Note Intern Pager: [phone]  Patient name: Steven Beasley Medical record number: [id number] Date of birth: 07-31-1940 Age: 74 y.o. Gender: male  Primary Care Provider: Georges Lynch, MD Consultants: Cardiology Code Status: FULL  Pt Overview and Major Events to Date:  11/4: Admitted for acute respiratory failure with hypoxemia likely COPD exacerbation (initially thought to be HCAP; broad spectrum abx discontinued) initially requiring bipap now on 1L [place]  Assessment and Plan: Steven Beasley is a 74 y.o. male presenting with shortness of breath and productive cough and was found to have multifocal atrial tachycardia. PMH is significant for COPD, CHF (EF 30-35%), HTN, EtOH use, GERD, BPH, smoldering myeloma  Acute Respiratory Failure with hypoxemia likely COPD exacerbation: Ronchi and wheezing on lung exam. Likely due to COPD exacerbation: notes of increased dyspnea and cough with white sputum production with no increased sputum production. CTA chest showing ground-glass centrilobular nodules within RIGHT middle and RIGHT upper lobe which may be infectious or inflammatory; negative for pulmonary embolism. Initially thought to have HCAP however, patient is afebrile, no luekocytosis and imaging not consistent with infiltrate suggesting pneumonia. Chest x-ray showing cardiomegaly, hyperinflation, some pulmonary cephalization but no acute consolidations. Improvement with BiPAP, transitioned to non-rebreather to [place] and now on room air. 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 Troponins: plateau at 0.05 x 3. Unlikely this is due to ACS. BNP elevated at 480 however it is chronically elevated per chart review. - Pulmonology consulted, appreciate recommendations:  - Prednisone 50 mg daily starting 11/5  - Doxycycline started 11/4 (will treat for 5 days) - on Advair at home; [facility] while in hospital  - Spiriva - PPI while on steroids - PT/OT: PT/OT no  needs - will need to ambulate while monitoring O2 saturations before discharge to assess need for home O2: 02 sats on RA with ambulation 89-90% with ambulation in halls - consult SW to transportation assistance and medication assistance  - home today   Multifocal Tachycardia: Initially presented with what was thought to be Afib with RVR but rate controlled after initiation of guilt. Per cardiology this is multifocal tachycardia. Has no prior history of arrythmia. TSH wml. HR overall improved. Patient asymptomatic.  -Telemetry - Cards consulted, appreciate recs: more likely multifocal tachycardia over Afib, no anticoagulation needed; will resolved with treatment of underlying lung disease - Metoprolol XL 25mg    HFrEF: ECHO 06/2014 shows EF 30-35%, left ventricle mildly dilated, possible moderate hypokinesis of inferior myocardium, mild aortic valve regurgitation, calcified annulus of mitral valve with mild regurgitation, mildly dilated left and right atrium, PA peak pressure 46 mmHg. Patient does not have BB as home med. Most recent outpatient weight 164lb on 01/17/2015, 159lb prior to that on 01/01/15. Weight on admission 163lb. Urine output 1.6L; Net since admin -870cc - Continue home med: Cozaar 100mg  qd - Metoprolol XL 25mg    Gold D COPD: PFT in 06/2014 FEV1% 38, DLCO 37%. Rhonchi and wheezing on lung exam. - S/p 125 mg IV Solu-Medrol by EMS.  - Prednisone 50 mg daily started 11/5  - home Spiriva  - Dulera - PRN albuterol nebs - Oxygen as needed  HTN- soft (upper 90s to 108/55-70) - Continue home meds: Cozaar 100mg  qd - Holding home Norvasc 10mg  qd  GERD - Protonix 40mg  qd  BPH: - Continue home med: Flomax 0.4mg  qd  History of Prolonged QTc:  - Caution with medications known to prolong QT  History of EtOH use: EtOH <2   -CIWA  protocol  -Folic acid and Thiamine   Normocytic Anemia: Admission hgb 13.6, 12> 10.6; Baseline ~11.  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 Annual imaging followed by CT or MRA is recommended  - will note on discharge summary   FEN/GI: heart healthy  Prophylaxis: subQ heparin  Disposition: improvement of respiratory status, likely tomorrow 11/7  Subjective:  - weaned to room air - albuterol PRN x 5 over 24 huors - doing well today; no shortness of breath but has some wheezing   Objective: Temp:  [98.2 F (36.8 C)-98.7 F (37.1 C)] 98.2 F (36.8 C) (11/07 0541) Pulse Rate:  [70-91] 79 (11/07 0541) Resp:  [18-20] 18 (11/07 0541) BP: (96-108)/(55-64) 96/64 mmHg (11/07 0541) SpO2:  [94 %-100 %] 100 % (11/07 0541) Physical Exam: General: NAD, resting in bed  Cardiovascular: irregular rhythm; rate fluctuates (up to 130-140s for a few seconds then return to 90s), no m/r/g; distant heart sounds; no JVD Respiratory: 1L O2 via [place] with good saturations; expiratory wheezing throughout bilaterally, prolonged expiratory phase. No crackles  Abdomen: soft, NT, ND Extremities: No LE edema  Laboratory:  Recent Labs Lab 02/22/15 0354 02/22/15 0403 02/23/15 0416  WBC  --  9.2 7.4  HGB 13.6 12.0* 10.6*  HCT 40.0 36.1* 32.6*  PLT  --  266 235    Recent Labs Lab 02/22/15 0354 02/22/15 0403 02/23/15 0416  NA 138 138 136  K 4.5 4.6 4.2  CL 94* 95* 98*  CO2  --  35* 33*  BUN 16 11 11   CREATININE 1.10 1.13 0.86  CALCIUM  --  10.0 9.0  GLUCOSE 108* 104* 142*   Imaging/Diagnostic Tests: CTA chest: 11/4 IMPRESSION: No acute pulmonary embolism. Persistent bronchial wall thickening and mucoid impaction. Increasing ground-glass centrilobular nodules within RIGHT middle and RIGHT upper lobe which may be infectious or inflammatory. Heterogeneous lung attenuation most compatible with small airway disease in a background of moderate to severe centrilobular emphysema. 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 Recommend annual imaging followup by CTA or  MRA.  Smiley Houseman, MD 02/25/2015, 6:57 AM PGY-1, [place] Intern pager: [phone], text pages welcome

## 2015-02-25 NOTE — Evaluation (Signed)
Occupational Therapy Evaluation Patient Details Name: Steven Beasley MRN: [id number] DOB: 05-Apr-1941 Today's Date: 02/25/2015    History of Present Illness Pt admitted with acute respiratory failure, hypoxemia. PMH: COPD, CHF, HTN, ETOH use, smoldering myeloma.   Clinical Impression   Pt is performing ADL and ADL transfers at an independent level.  02 sats on RA with ambulation 89-90% with ambulation in halls.  Pt utilizing breathing techniques throughout ambulation. Educated in energy conservation during ADL and pacing.  No further OT needs.   Follow Up Recommendations  No OT follow up    Equipment Recommendations  None recommended by OT    Recommendations for Other Services       Precautions / Restrictions Precautions Precautions: None Restrictions Weight Bearing Restrictions: No      Mobility Bed Mobility Overal bed mobility: Independent                Transfers Overall transfer level: Independent                    Balance                                            ADL Overall ADL's : Modified independent                                       General ADL Comments: Pt educated in energy conservation strategies and pacing.  Utilizes breathing techniques.     Vision     Perception     Praxis      Pertinent Vitals/Pain Pain Assessment: No/denies pain     Hand Dominance Right   Extremity/Trunk Assessment Upper Extremity Assessment Upper Extremity Assessment: Overall WFL for tasks assessed   Lower Extremity Assessment Lower Extremity Assessment: Overall WFL for tasks assessed   Cervical / Trunk Assessment Cervical / Trunk Assessment: Normal   Communication Communication Communication: No difficulties   Cognition Arousal/Alertness: Awake/alert Behavior During Therapy: WFL for tasks assessed/performed Overall Cognitive Status: Within Functional Limits for tasks assessed                      General Comments       Exercises       Shoulder Instructions      Home Living Family/patient expects to be discharged to:: Private residence Living Arrangements: Alone Available Help at Discharge: Family;Available PRN/intermittently Type of Home: Apartment Home Access: Level entry     Home Layout: One level     Bathroom Shower/Tub: Teacher, early years/pre: Standard     Home Equipment: None          Prior Functioning/Environment Level of Independence: Independent        Comments: does not drive, nephew assist with transportation    OT Diagnosis: Generalized weakness   OT Problem List:     OT Treatment/Interventions:      OT Goals(Current goals can be found in the care plan section) Acute Rehab OT Goals Patient Stated Goal: go home  OT Frequency:     Barriers to D/C:            Co-evaluation              End of Session Nurse Communication:  (02 sats)  Activity Tolerance: Patient tolerated treatment well Patient left: in bed;with call bell/phone within reach   Time: 0845-0905 OT Time Calculation (min): 20 min Charges:  OT General Charges $OT Visit: 1 Procedure OT Evaluation $Initial OT Evaluation Tier I: 1 Procedure G-Codes:    Malka So 02/25/2015, 9:26 AM  [phone]

## 2015-02-25 NOTE — Discharge Summary (Signed)
[facility] Discharge Summary  Patient name: Steven Beasley Medical record number: [id number] Date of birth: January 09, 1941 Age: 74 y.o. Gender: male Date of Admission: 02/22/2015  Date of Discharge: 02/25/15 Admitting Physician: Zenia Resides, MD  Primary Care Provider: Georges Lynch, MD Consultants: pulmonology, pulmonology   Indication for Hospitalization: acute hypoxic respiratory failure  Discharge Diagnoses/Problem List:  COPD GOLD D exacerbation Multifocal Tachycardia HFrEF HTN GERD BPH History of EtOH use Aneurysmal Ascending thoracic aorta  Disposition: home  Discharge Condition: improved  Discharge Exam: GEN: NAD HEENT: Atraumatic, normocephalic, neck supple, EOMI, sclera clear  CV: RRR, no murmurs, rubs, or gallops PULM:  normal effort, patient noted to purse lips when exhaling, good saturations on room air; improved air movement; some expiratory wheezing however significantly improved from admission ABD: Soft, nontender, nondistended, NABS, no organomegaly SKIN: No rash or cyanosis; warm and well-perfused EXTR: No lower extremity edema or calf tenderness PSYCH: Mood and affect euthymic, normal rate and volume of speech NEURO: Awake, alert, no focal deficits grossly, normal speech  Brief Hospital Course:   Acute Respiratory Failure with hypoxemia likely COPD exacerbation (GOLD D): Of note, patient was recently discharged from hospital after treatment for COPD exacerbation on 02/11/15. Patient noted of shortness of breath with productive cough since the day prior to admission; his symptoms worsening on day of admission therefore EMS was called. Patient noted to have 80% O2 saturations on room air, and was placed on Bipap. CXR showed cardiomegaly, hyperinflation, some pulmonary cephalization but no consolidations. CTA chest without evidence of PE but did show ground glass opacities (infection vs inflammation). Patient was afebrile, CBC showed  no leukocytosis. Initially required Bipap was eventually weaned from non-rebreather to nasal canula to room air with good O2 saturations. He was given Solumedrol en route to ED. Patient was initially thought to have HCAP and was given 1 dose of Azithromycin and Ceftriaxone. This was discontinued on admission to the floor as pulmonology (consulted) thought HCAP was not likely and recommend treatment for COPD exacerbation.  Patient was started on Duonebs, then eventually switched to home Spiriva. Ruthe Mannan was continued. He was started on Prednisone 50mg  and will complete a 5 day course on 02/26/15. He was also started on Doxycycline and will complete a 7 day course through 11/10. Patient was ambulated on room air prior to discharge and did not qualify to require home oxygen.   Multifocal Tachycardia: Patient initially thought to be in Atrial Fibrillation with RVR and was given Diltiazem after which heart rate improved. Cardiology was consulted and diagnosed multifocal tachycardia rather than atrial fibrillation; noted that this will improve with treatment of underlying  lung disease. TSH was normal. Heart rates did improve through out hospital course. Patient was asymptomatic. Metoprolol XL was started, please note below.  HFrEF: ECHO 06/2014 shows EF 30-35%, left ventricle mildly dilated, possible moderate hypokinesis of inferior myocardium, mild aortic valve regurgitation, calcified annulus of mitral valve with mild regurgitation, mildly dilated left and right atrium, PA peak pressure 46 mmHg. Patient did not have BB as home med. Patient was not clinically volume overloaded. Additionally BNP was 480 (per chart review it is chronically elevated). Therefore patient was started on Metoprolol XL 25mg  due to indication for HFrEF with his ejection fraction.   HTN: Patient's blood pressures were stable on home Cozaar. Home Norvasc was held during hospital course and was not restarted at discharge as BP was stable  without it.   History of EtOH use: Patient was  on CIWA protocol and did not have signs or symptoms of withdrawal. Folic acid and thiamine were given during hospital course.   Patinet's other chronic problems were monitored and/or managed with home medications. (GERD, BPH, Normocytic Anemia)  Issues for Follow Up:  - f/u respiratory status; ensure patient went to pulmonology appointment on 11/8 - f/u HTN as norvasc was held at discharge - Ascending thoracic aortic aneurysm 4.2cm - newly noted on CTA chest. Needs follow up CTA or MRA annually.  Significant Procedures: none  Significant Labs and Imaging:   Recent Labs Lab 02/22/15 0354 02/22/15 0403 02/23/15 0416  WBC  --  9.2 7.4  HGB 13.6 12.0* 10.6*  HCT 40.0 36.1* 32.6*  PLT  --  266 235    Recent Labs Lab 02/22/15 0354 02/22/15 0403 02/23/15 0416  NA 138 138 136  K 4.5 4.6 4.2  CL 94* 95* 98*  CO2  --  35* 33*  GLUCOSE 108* 104* 142*  BUN 16 11 11   CREATININE 1.10 1.13 0.86  CALCIUM  --  10.0 9.0  MG  --  1.5*  --    Results/Tests Pending at Time of Discharge: none  Discharge Medications:    Medication List    STOP taking these medications        amLODipine 10 MG tablet  Commonly known as:  NORVASC     cefdinir 300 MG capsule  Commonly known as:  OMNICEF      TAKE these medications        albuterol (2.5 MG/3ML) 0.083% nebulizer solution  Commonly known as:  PROVENTIL  Take 3 mLs (2.5 mg total) by nebulization every 4 (four) hours as needed for wheezing or shortness of breath (dx J44.9).     albuterol 108 (90 BASE) MCG/ACT inhaler  Commonly known as:  PROVENTIL HFA;VENTOLIN HFA  Inhale 2 puffs into the lungs every 6 (six) hours as needed for wheezing or shortness of breath.     aspirin 81 MG EC tablet  Take 1 tablet (81 mg total) by mouth daily.     doxycycline 100 MG tablet  Commonly known as:  VIBRA-TABS  Take 1 tablet (100 mg total) by mouth every 12 (twelve) hours.     esomeprazole 40 MG  capsule  Commonly known as:  NEXIUM  TAKE 1 CAPSULE BY MOUTH EVERY MORNING BEFORE A MEAL     fluticasone 50 MCG/ACT nasal spray  Commonly known as:  FLONASE  SHAKE WELL AND USE 2 SPRAYS IN EACH NOSTRIL DAILY     Fluticasone-Salmeterol 250-50 MCG/DOSE Aepb  Commonly known as:  ADVAIR DISKUS  Inhale one puff in to your lungs twice daily     gabapentin 300 MG capsule  Commonly known as:  NEURONTIN  Take 300 mg by mouth 3 (three) times daily.     losartan 100 MG tablet  Commonly known as:  COZAAR  Take 1 tablet (100 mg total) by mouth daily.     metoprolol succinate 25 MG 24 hr tablet  Commonly known as:  TOPROL-XL  Take 1 tablet (25 mg total) by mouth daily.     pentosan polysulfate 100 MG capsule  Commonly known as:  ELMIRON  Take 100 mg by mouth 3 (three) times daily before meals.     predniSONE 50 MG tablet  Commonly known as:  DELTASONE  Take 1 tablet (50 mg total) by mouth daily with breakfast.     tadalafil 20 MG tablet  Commonly known as:  CIALIS  Take 0.5-1 tablets (10-20 mg total) by mouth every other day as needed for erectile dysfunction.     tamsulosin 0.4 MG Caps capsule  Commonly known as:  FLOMAX  Take 0.4 mg by mouth daily.     tiotropium 18 MCG inhalation capsule  Commonly known as:  SPIRIVA HANDIHALER  Place 1 capsule (18 mcg total) into inhaler and inhale daily.     venlafaxine XR 150 MG 24 hr capsule  Commonly known as:  EFFEXOR-XR  TAKE 1 CAPSULE(150 MG) BY MOUTH DAILY        Discharge Instructions: Please refer to Patient Instructions section of EMR for full details.  Patient was counseled important signs and symptoms that should prompt return to medical care, changes in medications, dietary instructions, activity restrictions, and follow up appointments.   Follow-Up Appointments:     Follow-up Information    Follow up with PARRETT,TAMMY, NP On 02/26/2015.   Specialty:  Pulmonary Disease   Why:  at 2:15PM for your COPD.    Contact  information:   [address] [place] [address] [phone]       Follow up with Georges Lynch, MD On 03/05/2015.   Specialty:  Family Medicine   Why:  at 2:15PM for hospital follow up visit    Contact information:   [address] [place] [address] [phone]       Smiley Houseman, MD 02/25/2015, 6:25 PM PGY-1, [place]

## 2015-02-25 NOTE — Discharge Instructions (Signed)
-   You were admitted for a flare of your COPD. We have prescribed you some medications for this. Please take as prescribed. You will take your last dose of Prednisone on 11/8. Remember to take the second dose for your Doxycycline tonight (you already got your first dose for the day at the hospital). Then you will take Doxycycline 1 tablet twice a day for the next 3 days.  - Your blood pressure has been fine without one of your home medications (Norvasc also known as Amlodipine). Please stop taking this medication for now.  - We started you on a new medication called Metoprolol XL for your heart. Please take as prescribed - The lung doctors (pulmonologist) would like you to keep your appointment with them tomorrow  - You have an appointment with Dr. Alease Frame for a hospital follow up visit   [place] IF:  You have shortness of breath (dyspnea) with or without activity.  You have rapid breathing (tachypnea).  You are wheezing.  You are unable to say more than a few words without having to catch your breath.  You find it very difficult to function normally.  You have a fast heart rate.  You have a bluish color to your finger or toe nail beds.  You have confusion or drowsiness or both.   This information is not intended to replace advice given to you by your health care provider. Make sure you discuss any questions you have with your health care provider.   Document Released: 04/11/2013 Document Revised: 12/26/2014 Document Reviewed: 04/11/2013 Elsevier Interactive Patient Education [employer].

## 2015-02-25 NOTE — Patient Outreach (Signed)
[facility]) Care Management  02/25/2015  Steven Beasley July 07, 1940 [id number]   Referral from Natividad Brood, RN, assigned Thea Silversmith, RN.  Thanks, Ronnell Freshwater. [place], [place] Assistant Phone: [phone] Fax: [phone]

## 2015-02-25 NOTE — Clinical Social Work Note (Signed)
CSW Consult Acknowledged:   CSW received a consult for transportation home. CSW will assist with getting the pt a cab voucher home. CSW spoke with the pt. CSW explained to the pt the discharge policy for cab voucher. Pt acknowledged that he will ask a family member to take him to get his medication filled. CSW will sign off.     [place], MSW, [place]

## 2015-02-25 NOTE — Consult Note (Signed)
   [facility] Inpatient Consult   02/25/2015  Steven Beasley Aug 29, 1940 [id number] Patient was assessed for [place] Management for community services. Patient was previously active with [place] Management with several failed attempts to out reach to the patient.   Met with patient at bedside regarding being restarted with [facility] services. Consent form signed and folder with [place] Management information given.  Patient states that he is usually out everyday at lunch at the [facility] for lunch.  He uses public transportation and is usually out much of the late morning to afternoon.  He endorses the need for post hospital follow up for his inhalers,  and he does not have a cell phone and not sure if he qualifies for the 'goverment' phone. Inpatient RNCM, Aldona Bar,  notified of [place] Management following.  Of note, [facility] Care Management services does not replace or interfere with any services that are arranged by inpatient case management or social work. For additional questions or referrals please contact: Natividad Brood, RN BSN [place] [facility] Liaison  [phone] business mobile phone

## 2015-02-25 NOTE — Progress Notes (Signed)
Pt HR got up to 160 SVT momentarily x2 this morning, but quickly came back to 90-100 bpm a-fib.  MD was notified.  RN reported that on night shift the pt had several episodes of this, and has been having this happen the past few days.  Pt also c/o nausea this morning, which has since subsided.  MD was notified.  Will continue to monitor.

## 2015-02-26 ENCOUNTER — Other Ambulatory Visit: Payer: Self-pay

## 2015-02-26 ENCOUNTER — Ambulatory Visit: Payer: Commercial Managed Care - HMO | Admitting: Adult Health

## 2015-02-26 NOTE — Patient Outreach (Signed)
[facility]) Care Management  02/26/2015  Steven Beasley May 23, 1940 [id number]  Assessment: 74 year old with recent admission 11/4-11/7 for health care associated pneumonia/COPD. Call for transition of care. No answer. Unable to leave message.  Plan: call tomorrow.  Thea Silversmith, RN, MSN, [place] Coordinator Cell: [phone]

## 2015-02-27 ENCOUNTER — Other Ambulatory Visit: Payer: Self-pay

## 2015-02-27 LAB — CULTURE, BLOOD (ROUTINE X 2)
CULTURE: NO GROWTH
CULTURE: NO GROWTH

## 2015-02-27 NOTE — Patient Outreach (Signed)
[facility]) Care Management  02/27/2015  Steven Beasley 1940-08-03 [id number]  Assessment: call for transition of care. No answer. Question if voice message picked up and HIPPA compliant message left.  Plan: RNCM will call tomorrow-third attempt.  Thea Silversmith, RN, MSN, [place] Coordinator Cell: [phone]

## 2015-02-28 ENCOUNTER — Other Ambulatory Visit: Payer: Self-pay

## 2015-02-28 NOTE — Patient Outreach (Signed)
Steven Beasley) Care Management  02/28/2015  Steven Beasley 08/30/1940 [id number]  Follow up call to Zoila Shutter is agreeable to [facility] meeting him at his next office visit next week.  Plan: office visit with member next week.  Thea Silversmith, RN, MSN, [place] Coordinator Cell: [phone]

## 2015-02-28 NOTE — Patient Outreach (Signed)
[facility]) Care Management  02/28/2015  MIKLE DENEAULT 1940/12/08 [id number]   Assessment: 74 year old with recent admission 11/4-11/7 with Healthcare Associated Pneumonia/COPD. Member reports he is doing well. States he has been going to the [facility] during the week. Member denies shortness of breath. States he has all his medications. Member acknowledged his upcoming appointment with Dr. Alease Frame and states he will request an appointment be made with pulmonology during his primary care visit. Member is without questions or concerns at this time. Medications reviewed-no questions or issues at this time. Discussed home visit follow up next week, but member request telephonic follow up next week.  Plan: Transition of care call next week.  Thea Silversmith, RN, MSN, [place] Coordinator Cell: [phone]

## 2015-03-01 ENCOUNTER — Telehealth: Payer: Self-pay | Admitting: Family Medicine

## 2015-03-05 ENCOUNTER — Ambulatory Visit: Payer: Commercial Managed Care - HMO | Admitting: Family Medicine

## 2015-03-05 ENCOUNTER — Ambulatory Visit: Payer: Self-pay

## 2015-03-06 ENCOUNTER — Ambulatory Visit: Payer: Commercial Managed Care - HMO

## 2015-03-21 NOTE — Telephone Encounter (Signed)
Absolutely terrible news. A genuine soul. I would be saddened, but honored, to complete the death certificate for this gentleman.

## 2015-03-21 NOTE — Telephone Encounter (Signed)
Notified by Tamika RN that [facility] called [place] to inform that patient was found deceased at home, presumably within the last 24 hours, and by all appearances due to natural causes. Inquiring whether our office is willing to sign the death certificate. Chart reviewed, patient with multiple admissions recently for shortness of breath/COPD.  Advised we will be able to sign death certificate. Will route to PCP as an FYI.  Leeanne Rio, MD

## 2015-03-21 NOTE — Telephone Encounter (Signed)
We have been informed that this patient passed away this morning. Steven Beasley, ASA

## 2015-03-21 DEATH — deceased

## 2015-04-29 ENCOUNTER — Ambulatory Visit: Payer: Commercial Managed Care - HMO | Admitting: Cardiovascular Disease

## 2015-05-14 ENCOUNTER — Encounter: Payer: Self-pay | Admitting: Gastroenterology

## 2015-10-25 ENCOUNTER — Encounter: Payer: Self-pay | Admitting: *Deleted

## 2016-03-10 IMAGING — CR DG CHEST 1V PORT
2 series · 2 of 2 positions shown · non-contrast
Comparison: 01/20/2013

CLINICAL DATA: Productive cough for 2 days.  Dyspnea.

EXAM:
PORTABLE CHEST - 1 VIEW

[AP (1 of 2)]
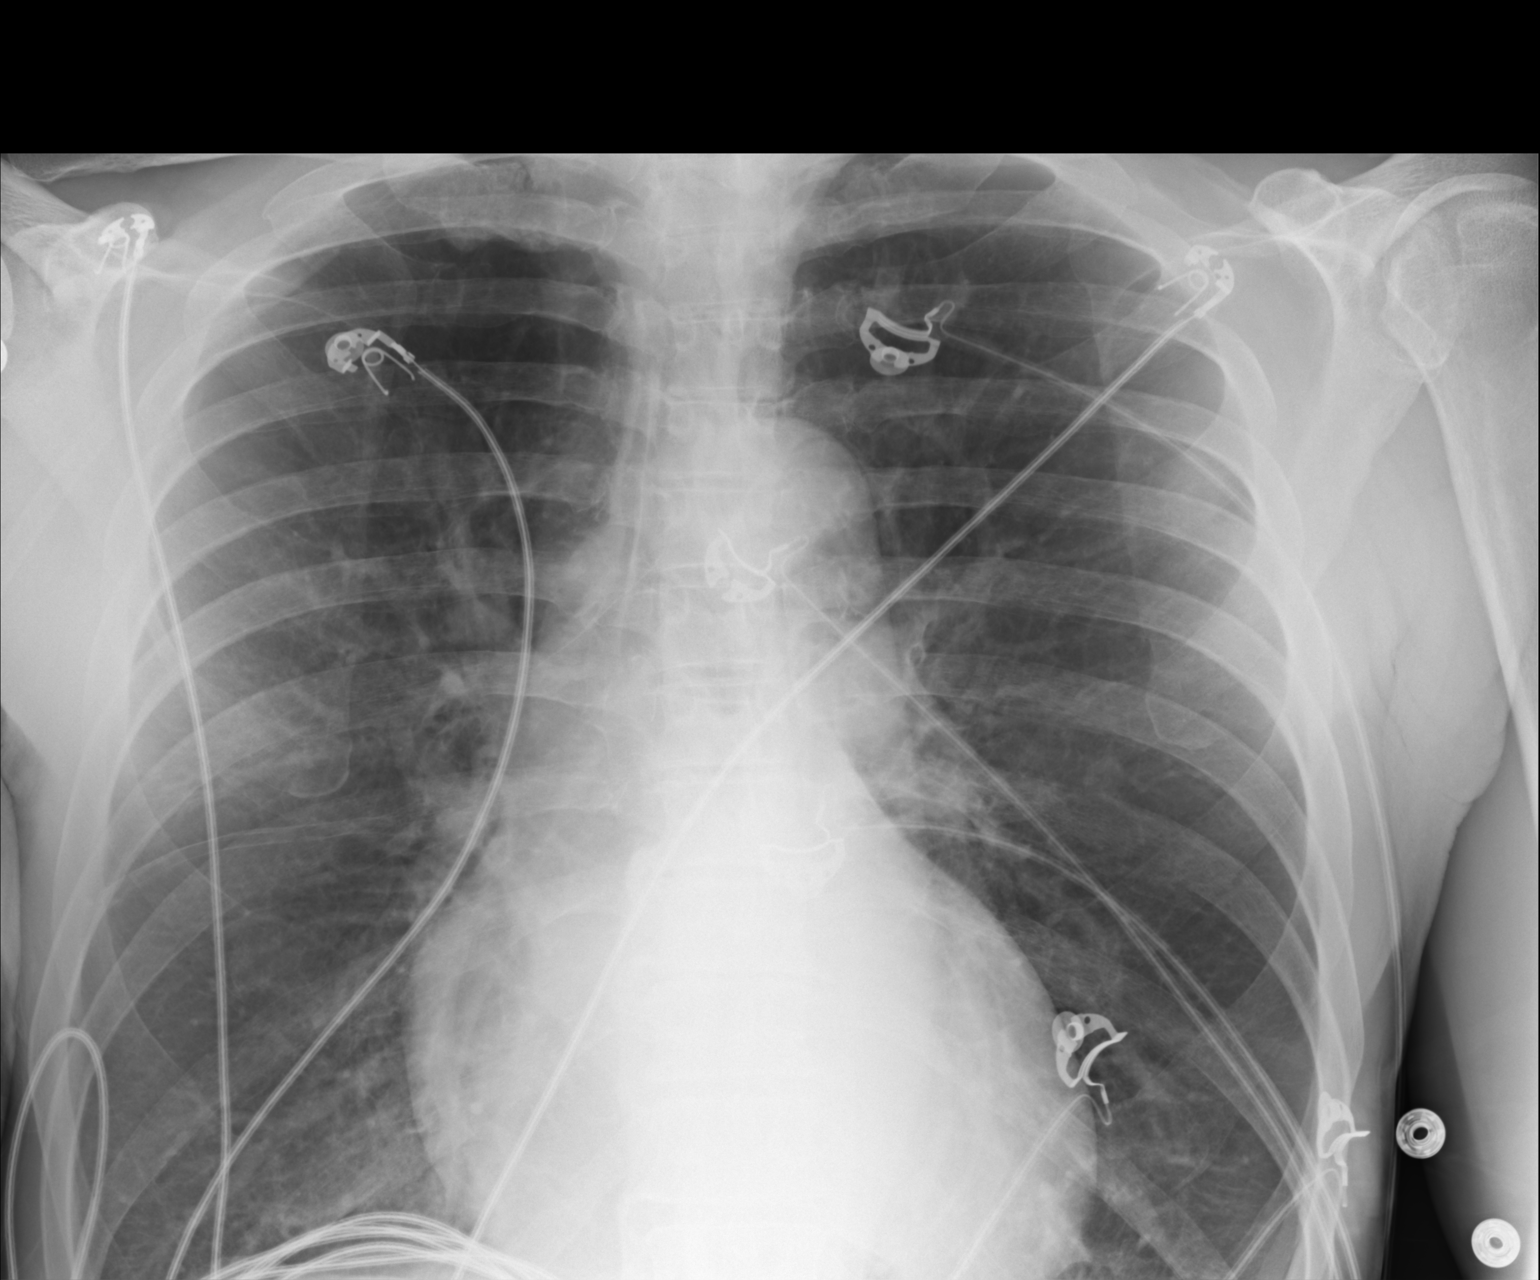

[AP (2 of 2)]
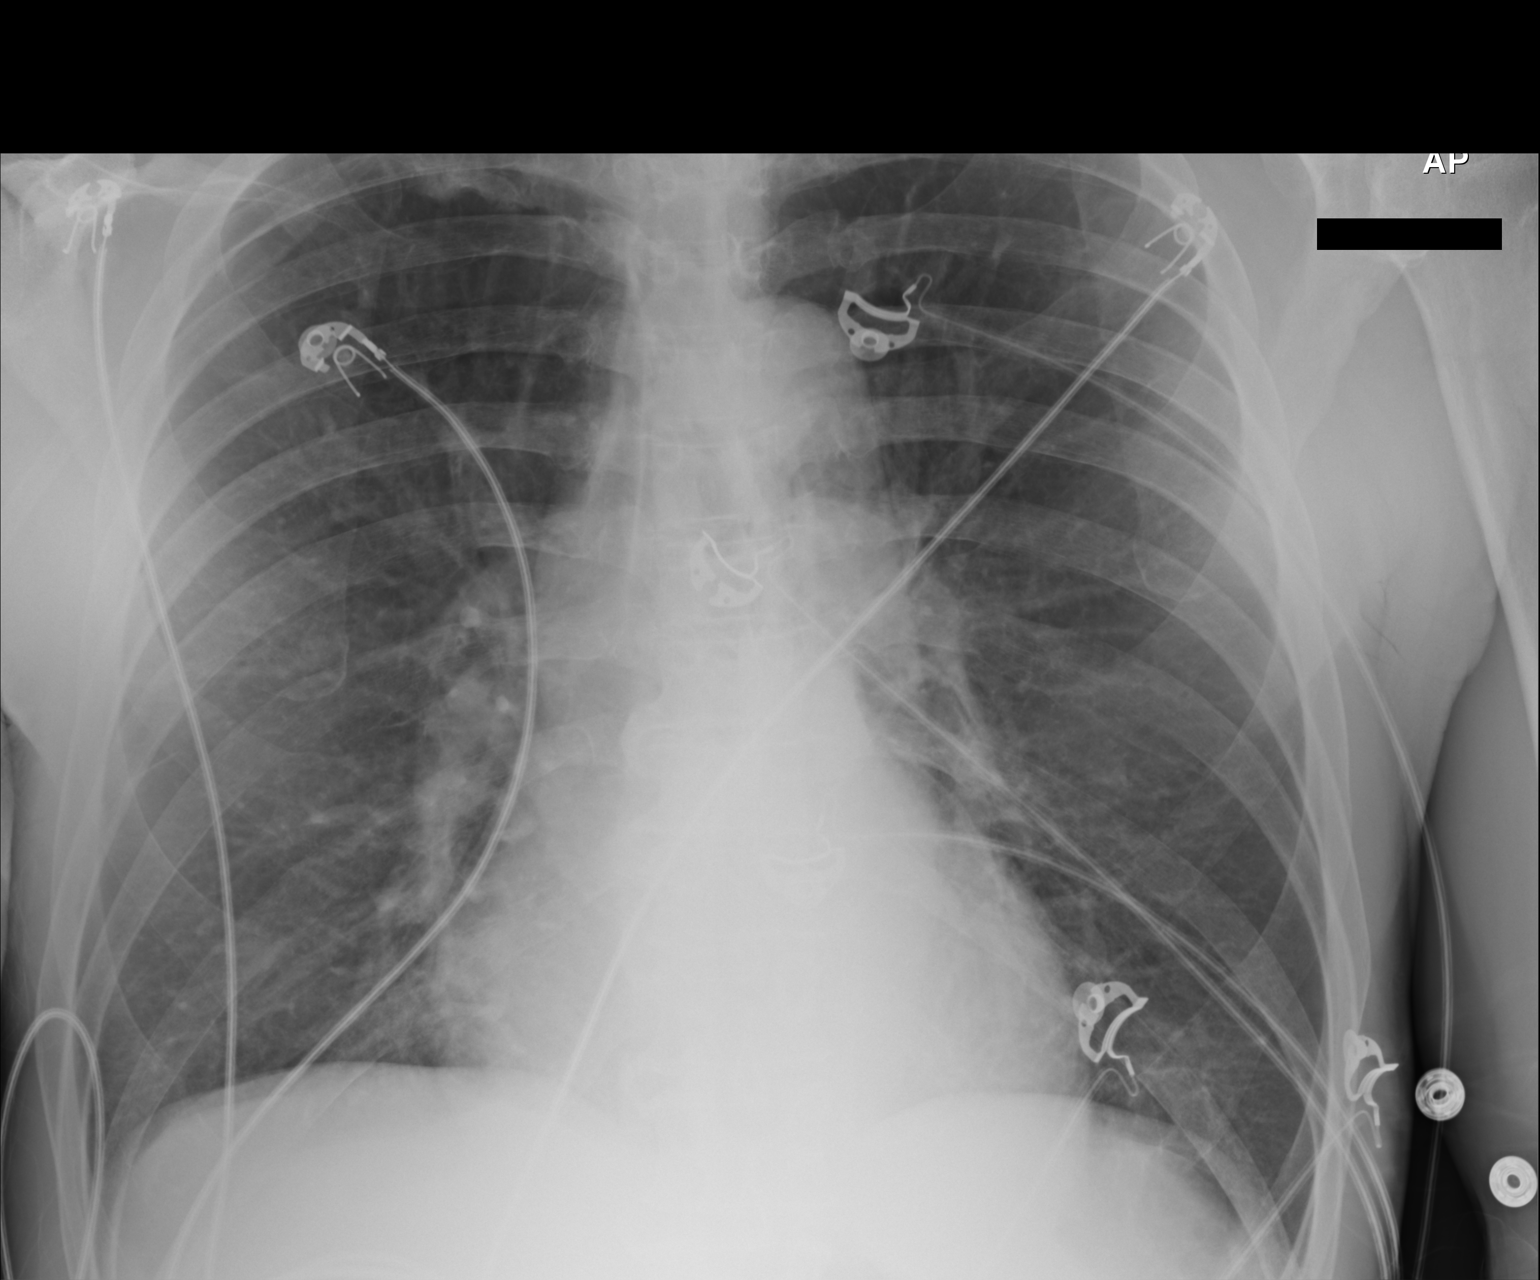

[2 of 2 positions shown; findings below may reference images not displayed]

FINDINGS: There is focal airspace opacity in the lateral aspect of the right
upper lobe. This may represent infectious infiltrate. Pulmonary
vasculature is normal. No large effusion is evident. Hilar and
mediastinal contours are unremarkable and unchanged.
IMPRESSION: Right upper lobe infiltrate

## 2016-09-15 IMAGING — CR DG CHEST 1V PORT
2 series · 2 of 2 positions shown · non-contrast
Comparison: Prior radiograph from 07/07/2014.

CLINICAL DATA: Initial evaluation for acute shortness of breath.

EXAM:
PORTABLE CHEST 1 VIEW

[AP (1 of 2)]
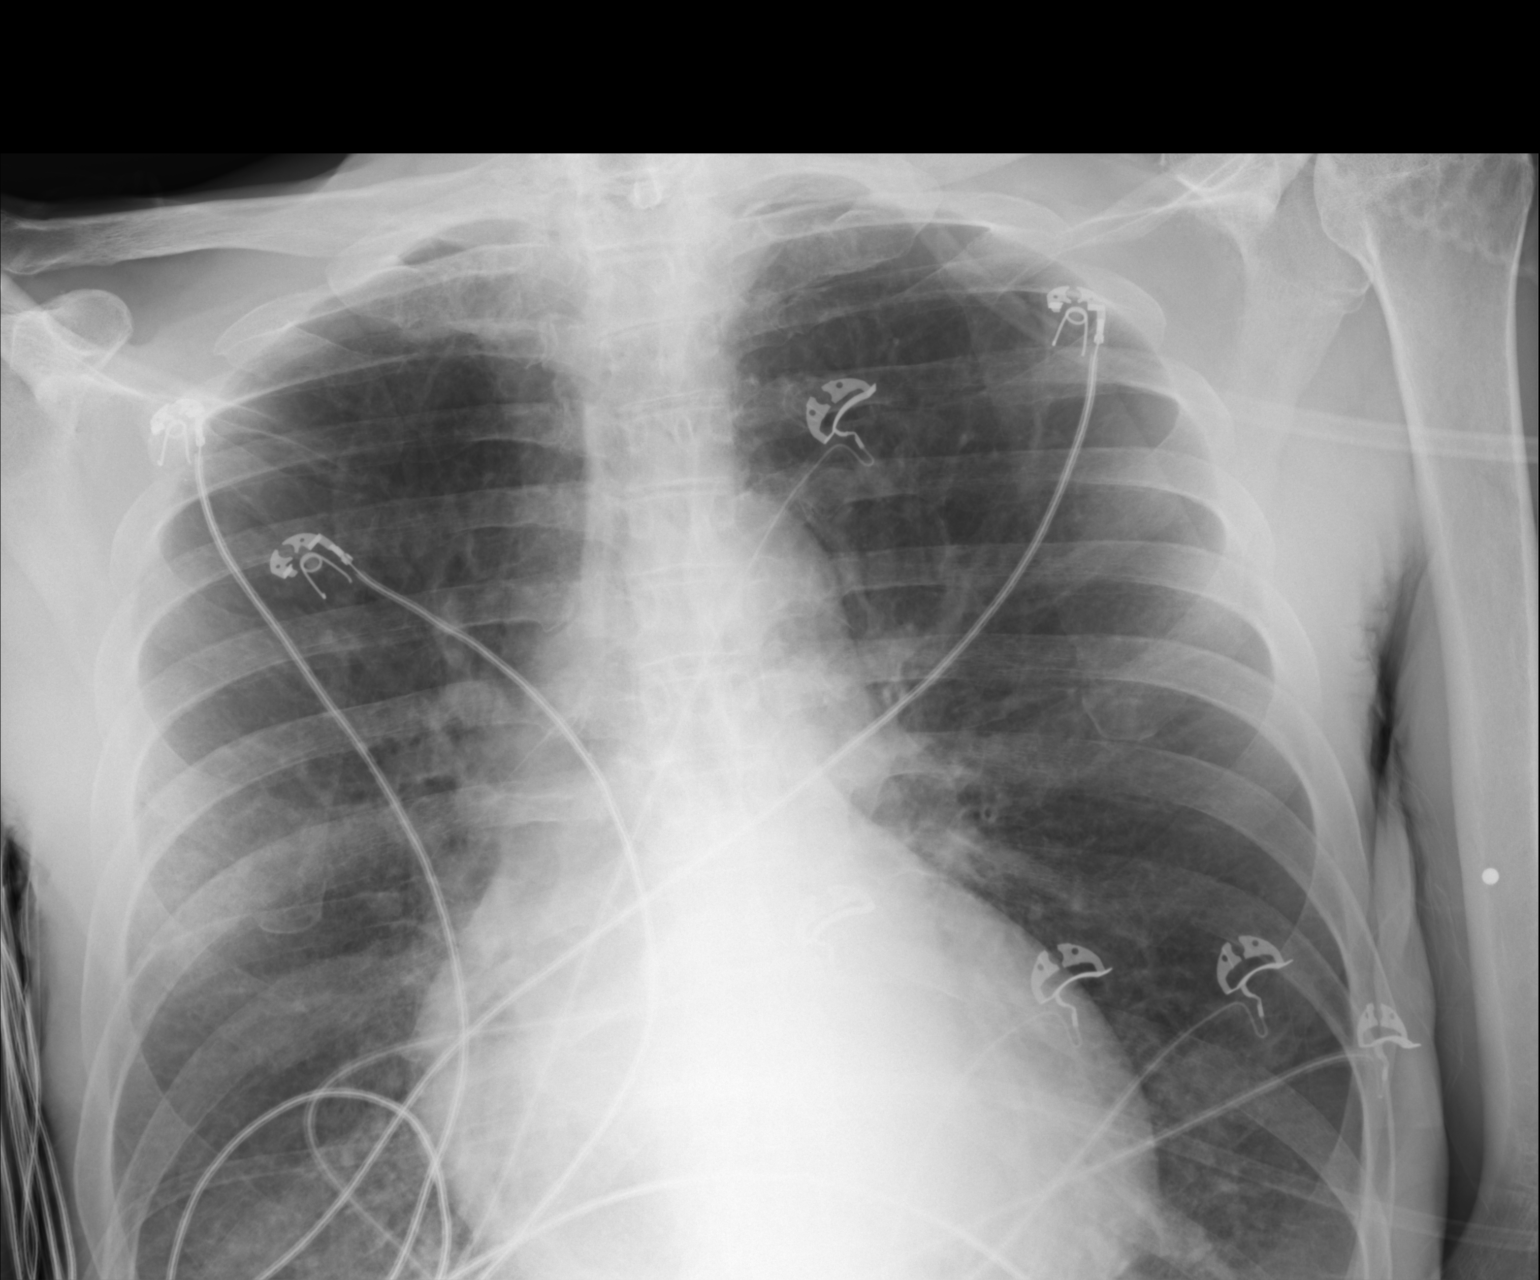

[AP (2 of 2)]
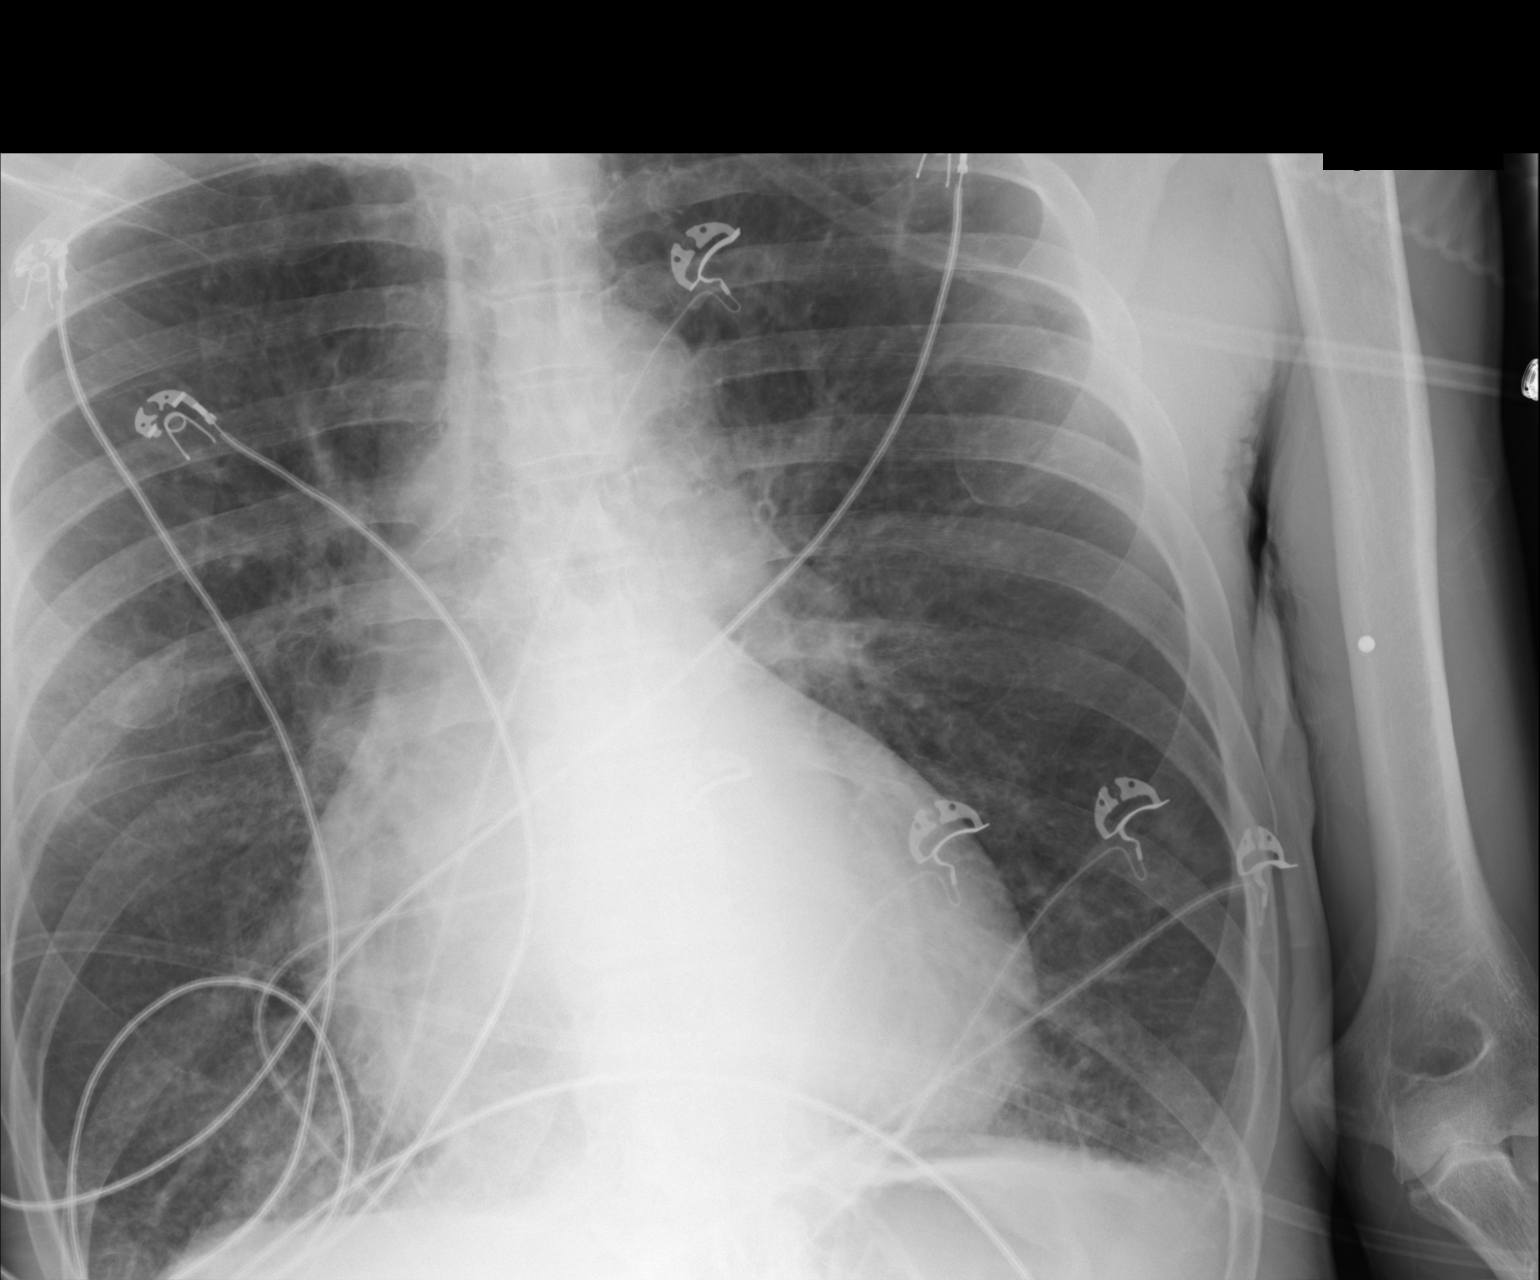

[2 of 2 positions shown; findings below may reference images not displayed]

FINDINGS: Cardiac and mediastinal silhouettes are stable.

Emphysematous changes noted. Mild bibasilar atelectasis. There is
persistent mild hazy opacity within the peripheral right lower lobe,
similar relative to previous study. Finding may reflect
persistent/chronic mucoid impaction and/or recurrent infiltrate. No
other focal airspace disease. No pulmonary edema or pleural
effusion. No pneumothorax.

No acute osseus abnormality.
IMPRESSION: 1. Hazy infiltrate within the peripheral right upper lobe, similar
relative to previous radiograph from 07/07/2014. Given the
chronicity of this finding, opacity may reflect an area of
persistent/chronic mucoid impaction as seen on prior CT from
07/09/2014. Recurrent/new pneumonia could also be considered in the
correct clinical setting.
2. Mild bibasilar atelectasis.

## 2016-10-26 IMAGING — CT CT ANGIO CHEST
2 of 6 series · 18 of 36 positions shown · IV contrast (omnipaque)
Comparison: Chest radiograph February 22, 2015 at [DATE] a.m. and CT
chest July 09, 2014

CLINICAL DATA: Worsening shortness of breath, hypoxia and new onset
atrial fibrillation. Assess for pulmonary embolism. History of
hypertension, asthma, myeloma, COPD.

EXAM:
CT ANGIOGRAPHY CHEST WITH CONTRAST
TECHNIQUE: Multidetector CT imaging of the chest was performed using the
standard protocol during bolus administration of intravenous
contrast. Multiplanar CT image reconstructions and MIPs were
obtained to evaluate the vascular anatomy.
CONTRAST:  80mL OMNIPAQUE IOHEXOL 350 MG/ML SOLN

[Series 7: pe thins · axial · 0.81mm/px · z∈[+1285,+1634]mm · 17 of 772 slices shown]
[im 37/772  lung]
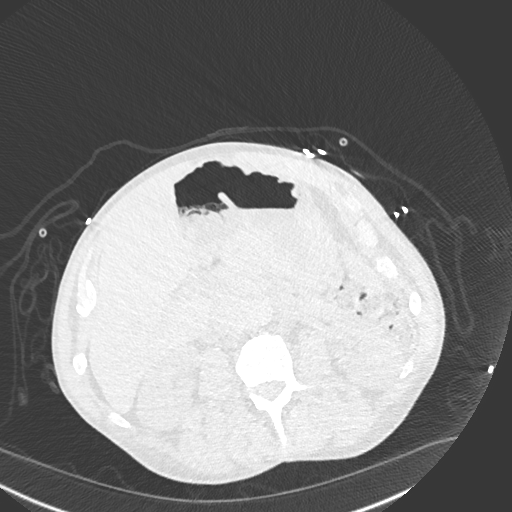
[im 74/772  mediastinal]
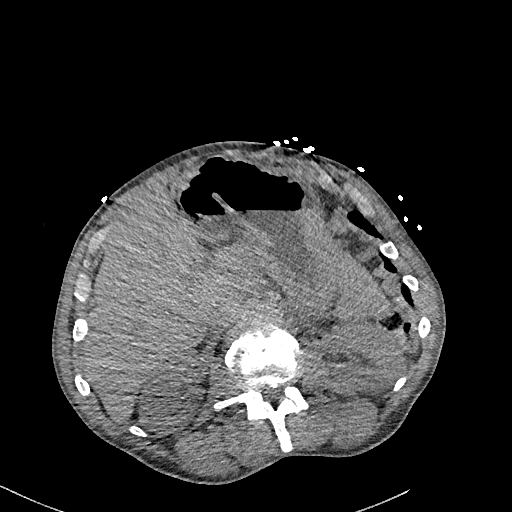
[im 111/772  lung]
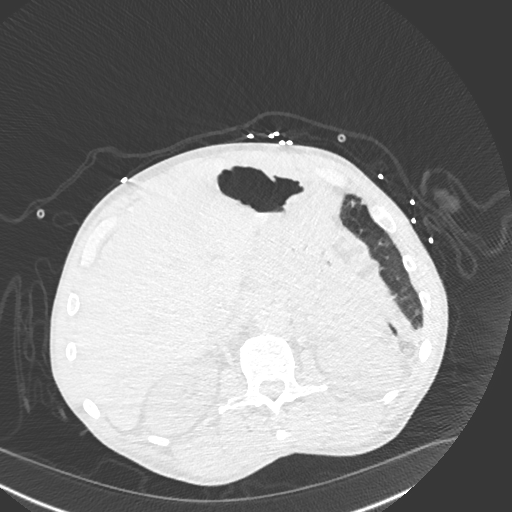
[im 184/772  mediastinal]
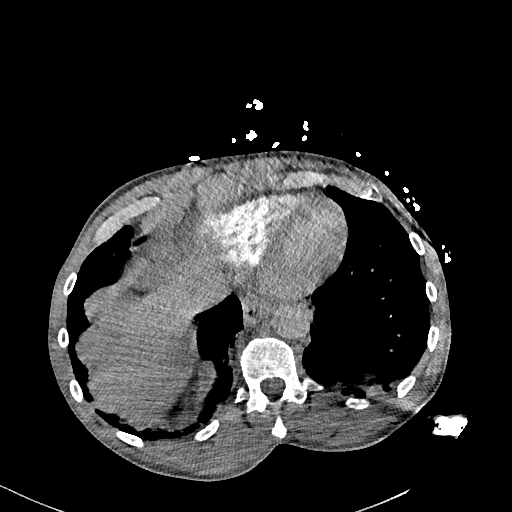
[im 221/772  lung]
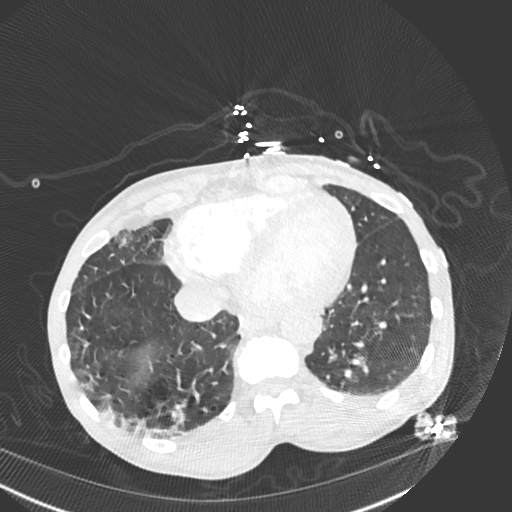
[im 258/772  mediastinal]
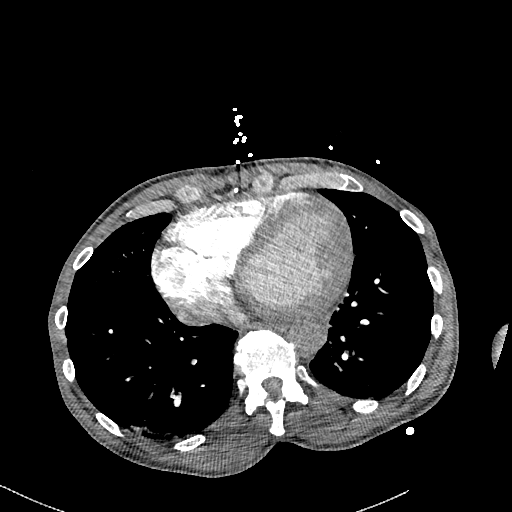
[im 294/772  lung]
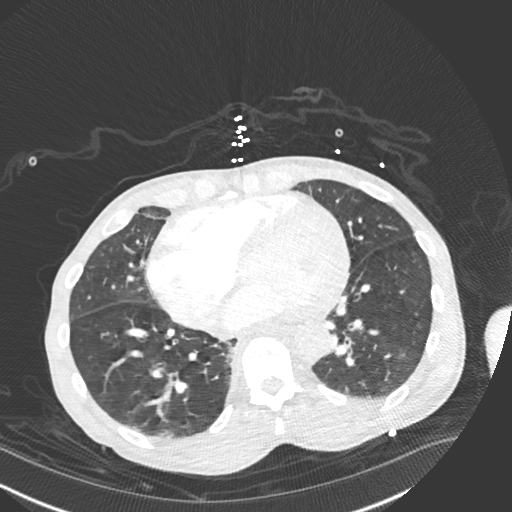
[im 331/772  mediastinal]
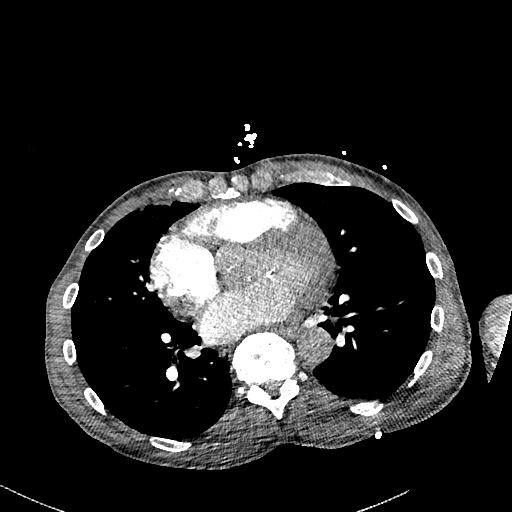
[im 404/772  lung]
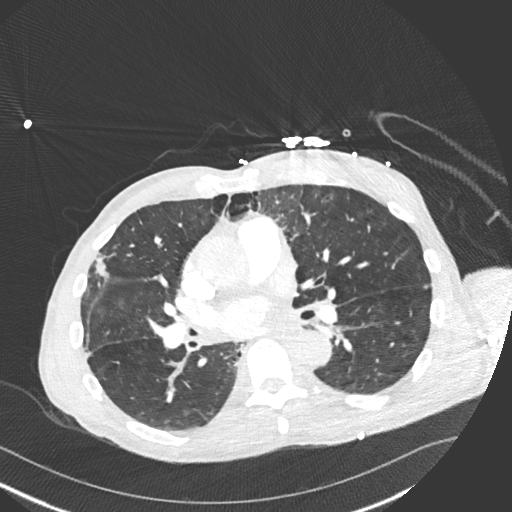
[im 441/772  mediastinal]
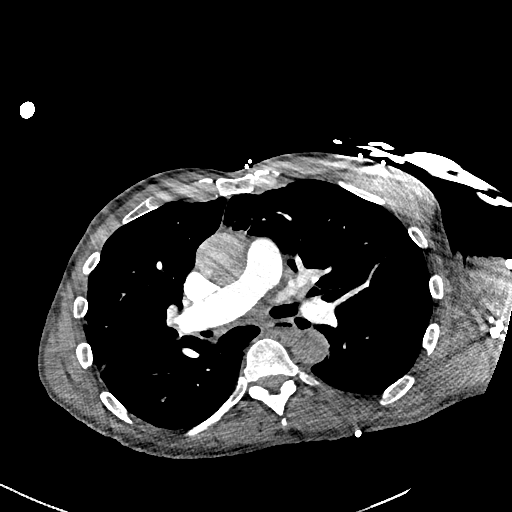
[im 478/772  lung]
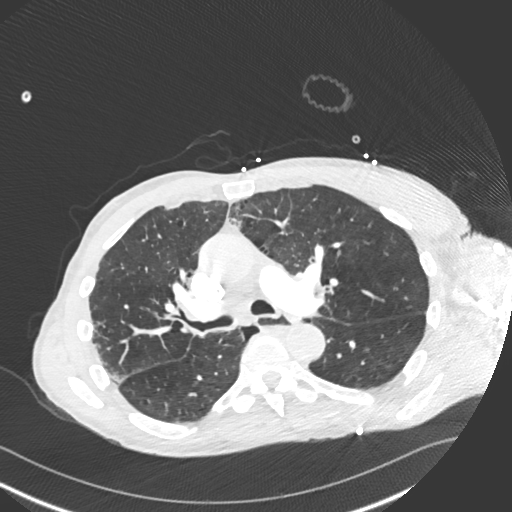
[im 515/772  mediastinal]
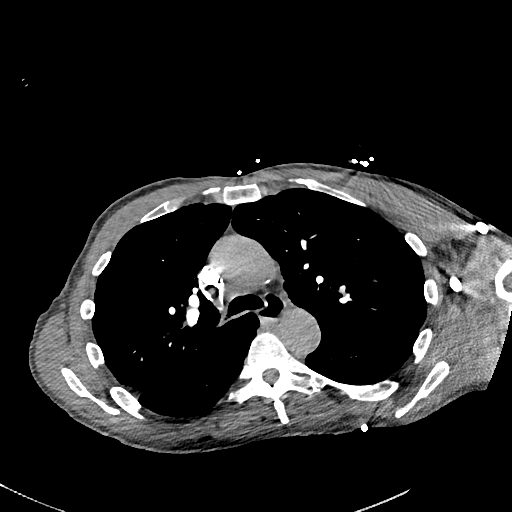
[im 551/772  lung]
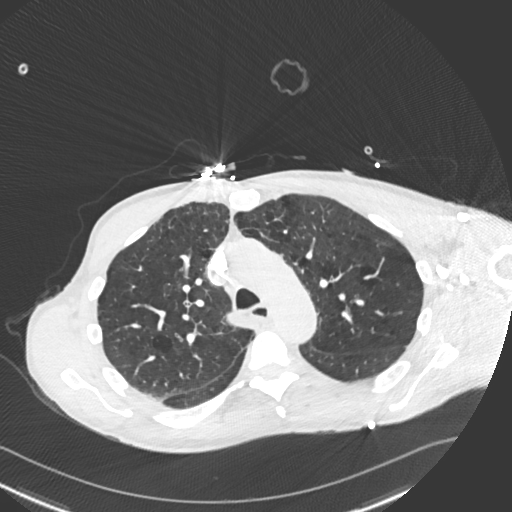
[im 588/772  mediastinal]
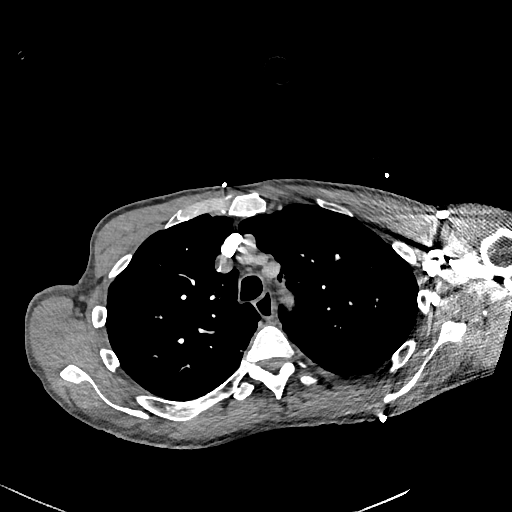
[im 661/772  lung]
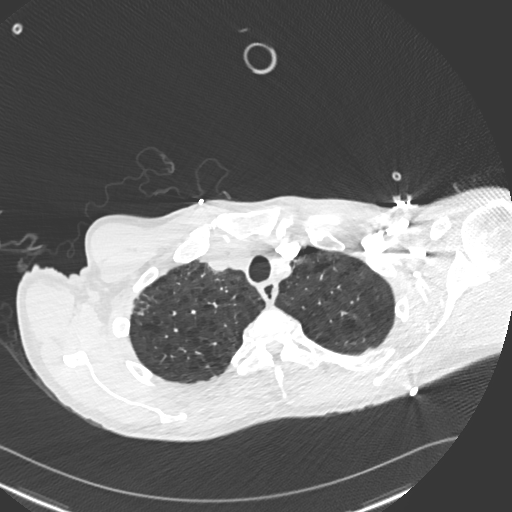
[im 698/772  mediastinal]
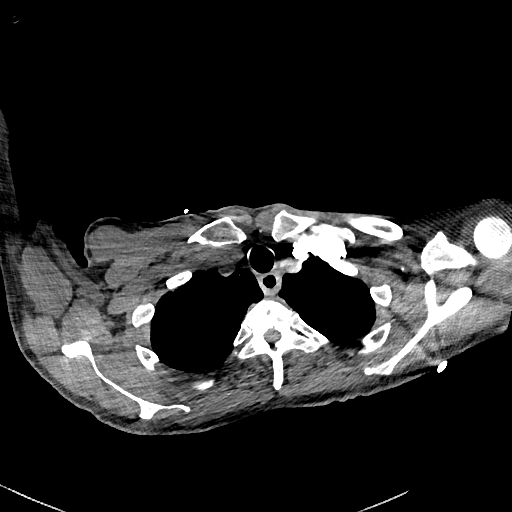
[im 735/772  lung]
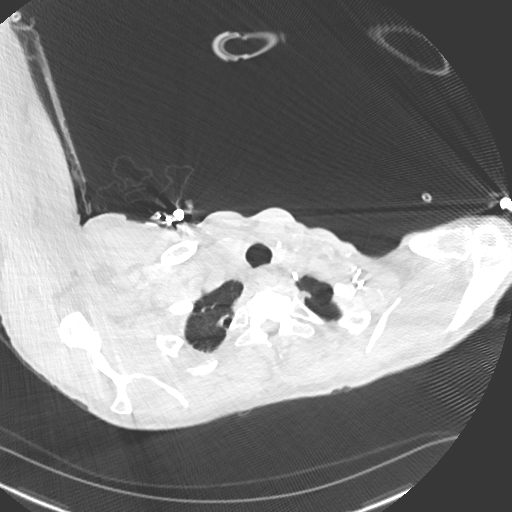

[Series 8: pe 2mm cor · coronal · 0.71mm/px · 1 of 124 slices shown]
[im 62/124  mediastinal]
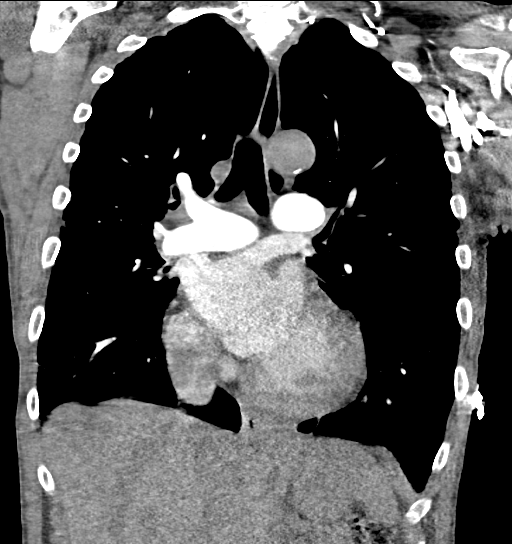

[18 of 36 positions shown; findings below may reference images not displayed]

FINDINGS: PULMONARY ARTERY: Adequate contrast opacification of the pulmonary
artery's. Main pulmonary artery is not enlarged. No pulmonary
arterial filling defects to the level of the subsegmental branches.

MEDIASTINUM: The heart is moderately enlarged. Mildly dilated RIGHT
ventricle. Mild calcific atherosclerosis of the aortic arch. No
pericardial fluid collections. Fusiform aneurysmal thoracic aorta
measuring 4.2 cm. No lymphadenopathy by CT size criteria.

LUNGS: Tracheobronchial tree is patent, no pneumothorax. Bronchial
wall thickening, with mild mucoid impaction. Ground-glass
centrilobular nodules in the periphery of the RIGHT upper lobe,
RIGHT middle lobe. Dependent atelectasis bilaterally. Moderate to
severe centrilobular emphysema. Heterogeneous lung attenuation
compatible with small airway disease.

SOFT TISSUES AND OSSEOUS STRUCTURES: Included view of the abdomen is
unremarkable. Moderate degenerative change of the dural thoracic
spine an included lumbar spine.

Review of the MIP images confirms the above findings.
IMPRESSION: No acute pulmonary embolism.

Persistent bronchial wall thickening and mucoid impaction.
Increasing ground-glass centrilobular nodules within RIGHT middle
and RIGHT upper lobe which may be infectious or inflammatory.

Heterogeneous lung attenuation most compatible with small airway
disease in a background of moderate to severe centrilobular
emphysema.

Aneurysmal ascending thoracic aorta at 4.2 cm. Recommend annual
imaging followup by CTA or MRA. This recommendation follows 5040
ACCF/AHA/AATS/ACR/ASA/SCA/SHEIX/STEFI/TIGER/NACHI Guidelines for the
Diagnosis and Management of Patients with Thoracic Aortic Disease.
Circulation. 5040; 121: [id number]
